# Patient Record
Sex: Female | Born: 1990
Health system: Southern US, Community
[De-identification: ages and names within clinical notes are randomized; demographics above are authoritative.]

---

## 2019-01-26 ENCOUNTER — Inpatient Hospital Stay (HOSPITAL_COMMUNITY)
Admission: EM | Admit: 2019-01-26 | Discharge: 2019-01-31 | DRG: 871 | Disposition: A | Discharge status: Home / Self Care | Payer: Self-pay | Source: Other Acute Inpatient Hospital | Attending: Internal Medicine | Admitting: Internal Medicine

## 2019-01-26 ENCOUNTER — Other Ambulatory Visit: Payer: Self-pay

## 2019-01-26 ENCOUNTER — Inpatient Hospital Stay (HOSPITAL_COMMUNITY): Payer: Self-pay

## 2019-01-26 ENCOUNTER — Encounter (HOSPITAL_COMMUNITY): Payer: Self-pay | Admitting: *Deleted

## 2019-01-26 DIAGNOSIS — R031 Nonspecific low blood-pressure reading: Secondary | ICD-10-CM | POA: Diagnosis present

## 2019-01-26 DIAGNOSIS — N61 Mastitis without abscess: Secondary | ICD-10-CM | POA: Diagnosis present

## 2019-01-26 DIAGNOSIS — Z803 Family history of malignant neoplasm of breast: Secondary | ICD-10-CM

## 2019-01-26 DIAGNOSIS — I959 Hypotension, unspecified: Secondary | ICD-10-CM | POA: Diagnosis present

## 2019-01-26 DIAGNOSIS — A419 Sepsis, unspecified organism: Principal | ICD-10-CM | POA: Diagnosis present

## 2019-01-26 DIAGNOSIS — Y95 Nosocomial condition: Secondary | ICD-10-CM | POA: Diagnosis present

## 2019-01-26 DIAGNOSIS — R112 Nausea with vomiting, unspecified: Secondary | ICD-10-CM | POA: Diagnosis present

## 2019-01-26 DIAGNOSIS — N611 Abscess of the breast and nipple: Secondary | ICD-10-CM | POA: Diagnosis present

## 2019-01-26 DIAGNOSIS — E876 Hypokalemia: Secondary | ICD-10-CM | POA: Diagnosis present

## 2019-01-26 DIAGNOSIS — J9621 Acute and chronic respiratory failure with hypoxia: Secondary | ICD-10-CM | POA: Diagnosis present

## 2019-01-26 DIAGNOSIS — D649 Anemia, unspecified: Secondary | ICD-10-CM | POA: Diagnosis present

## 2019-01-26 DIAGNOSIS — N179 Acute kidney failure, unspecified: Secondary | ICD-10-CM | POA: Diagnosis present

## 2019-01-26 DIAGNOSIS — Z20828 Contact with and (suspected) exposure to other viral communicable diseases: Secondary | ICD-10-CM | POA: Diagnosis present

## 2019-01-26 DIAGNOSIS — J189 Pneumonia, unspecified organism: Secondary | ICD-10-CM | POA: Diagnosis present

## 2019-01-26 LAB — COMPREHENSIVE METABOLIC PANEL
ALT: 19 U/L (ref 0–44)
AST: 17 U/L (ref 15–41)
Albumin: 2 g/dL — ABNORMAL LOW (ref 3.5–5.0)
Alkaline Phosphatase: 98 U/L (ref 38–126)
Anion gap: 12 (ref 5–15)
BUN: 6 mg/dL (ref 6–20)
CO2: 20 mmol/L — ABNORMAL LOW (ref 22–32)
Calcium: 7.4 mg/dL — ABNORMAL LOW (ref 8.9–10.3)
Chloride: 107 mmol/L (ref 98–111)
Creatinine, Ser: 1.19 mg/dL — ABNORMAL HIGH (ref 0.44–1.00)
GFR calc Af Amer: >60 mL/min (ref >60)
GFR calc non Af Amer: >60 mL/min (ref >60)
Glucose, Bld: 97 mg/dL (ref 70–99)
Potassium: 2.8 mmol/L — ABNORMAL LOW (ref 3.5–5.1)
Sodium: 139 mmol/L (ref 135–145)
Total Bilirubin: 1 mg/dL (ref 0.3–1.2)
Total Protein: 5.5 g/dL — ABNORMAL LOW (ref 6.5–8.1)

## 2019-01-26 LAB — CBC WITH DIFFERENTIAL/PLATELET
Abs Immature Granulocytes: 0.17 10*3/uL — ABNORMAL HIGH (ref 0.00–0.07)
Basophils Absolute: 0.1 10*3/uL (ref 0.0–0.1)
Basophils Relative: 0 %
Eosinophils Absolute: 0 10*3/uL (ref 0.0–0.5)
Eosinophils Relative: 0 %
HCT: 30.2 % — ABNORMAL LOW (ref 36.0–46.0)
Hemoglobin: 10 g/dL — ABNORMAL LOW (ref 12.0–15.0)
Immature Granulocytes: 1 %
Lymphocytes Relative: 10 %
Lymphs Abs: 2.4 10*3/uL (ref 0.7–4.0)
MCH: 26.7 pg (ref 26.0–34.0)
MCHC: 33.1 g/dL (ref 30.0–36.0)
MCV: 80.7 fL (ref 80.0–100.0)
Monocytes Absolute: 2.3 10*3/uL — ABNORMAL HIGH (ref 0.1–1.0)
Monocytes Relative: 10 %
Neutro Abs: 18.5 10*3/uL — ABNORMAL HIGH (ref 1.7–7.7)
Neutrophils Relative %: 79 %
Platelets: 269 10*3/uL (ref 150–400)
RBC: 3.74 MIL/uL — ABNORMAL LOW (ref 3.87–5.11)
RDW: 14.7 % (ref 11.5–15.5)
WBC: 23.4 10*3/uL — ABNORMAL HIGH (ref 4.0–10.5)
nRBC: 0 % (ref 0.0–0.2)

## 2019-01-26 LAB — SARS CORONAVIRUS 2 BY RT PCR (HOSPITAL ORDER, PERFORMED IN ~~LOC~~ HOSPITAL LAB): SARS Coronavirus 2: NEGATIVE

## 2019-01-26 LAB — TRIGLYCERIDES: Triglycerides: 93 mg/dL (ref <150)

## 2019-01-26 LAB — FERRITIN: Ferritin: 206 ng/mL (ref 11–307)

## 2019-01-26 LAB — PROCALCITONIN: Procalcitonin: 0.22 ng/mL

## 2019-01-26 LAB — I-STAT BETA HCG BLOOD, ED (MC, WL, AP ONLY): I-stat hCG, quantitative: <5 m[IU]/mL (ref <5)

## 2019-01-26 LAB — D-DIMER, QUANTITATIVE: D-Dimer, Quant: 3.62 ug/mL-FEU — ABNORMAL HIGH (ref 0.00–0.50)

## 2019-01-26 LAB — C-REACTIVE PROTEIN: CRP: 29.1 mg/dL — ABNORMAL HIGH (ref <1.0)

## 2019-01-26 LAB — LACTIC ACID, PLASMA
Lactic Acid, Venous: 0.7 mmol/L (ref 0.5–1.9)
Lactic Acid, Venous: 0.8 mmol/L (ref 0.5–1.9)

## 2019-01-26 LAB — MAGNESIUM: Magnesium: 1.6 mg/dL — ABNORMAL LOW (ref 1.7–2.4)

## 2019-01-26 LAB — VANCOMYCIN, TROUGH: Vancomycin Tr: 15 ug/mL (ref 15–20)

## 2019-01-26 LAB — LACTATE DEHYDROGENASE: LDH: 197 U/L — ABNORMAL HIGH (ref 98–192)

## 2019-01-26 LAB — FIBRINOGEN: Fibrinogen: 743 mg/dL — ABNORMAL HIGH (ref 210–475)

## 2019-01-26 MED ORDER — ALBUTEROL SULFATE (2.5 MG/3ML) 0.083% IN NEBU
2.5000 mg | INHALATION_SOLUTION | Freq: Four times a day (QID) | RESPIRATORY_TRACT | Status: DC
  Administered 2019-01-26 – 2019-01-27 (×3): 2.5 mg via RESPIRATORY_TRACT
  Filled 2019-01-26 (×4): qty 3

## 2019-01-26 MED ORDER — SODIUM CHLORIDE 0.9% FLUSH
3.0000 mL | Freq: Two times a day (BID) | INTRAVENOUS | Status: DC
  Administered 2019-01-26 – 2019-01-27 (×3): 3 mL via INTRAVENOUS

## 2019-01-26 MED ORDER — VANCOMYCIN HCL IN DEXTROSE 1-5 GM/200ML-% IV SOLN: 1000.0000 mg | INTRAVENOUS | Status: DC

## 2019-01-26 MED ORDER — VANCOMYCIN HCL 10 G IV SOLR
1500.0000 mg | INTRAVENOUS | Status: DC
  Administered 2019-01-26 – 2019-01-28 (×4): 1500 mg via INTRAVENOUS
  Filled 2019-01-26 (×5): qty 1500

## 2019-01-26 MED ORDER — ONDANSETRON HCL 4 MG/2ML IJ SOLN
4.0000 mg | Freq: Four times a day (QID) | INTRAMUSCULAR | Status: DC | PRN
  Administered 2019-01-27 – 2019-01-29 (×4): 4 mg via INTRAVENOUS
  Filled 2019-01-26 (×4): qty 2

## 2019-01-26 MED ORDER — ACETAMINOPHEN 325 MG PO TABS
650.0000 mg | ORAL_TABLET | Freq: Four times a day (QID) | ORAL | Status: DC | PRN
  Administered 2019-01-28: 650 mg via ORAL
  Filled 2019-01-26 (×2): qty 2

## 2019-01-26 MED ORDER — ENOXAPARIN SODIUM 40 MG/0.4ML ~~LOC~~ SOLN
40.0000 mg | SUBCUTANEOUS | Status: DC
  Administered 2019-01-26 – 2019-01-31 (×6): 40 mg via SUBCUTANEOUS
  Filled 2019-01-26 (×6): qty 0.4

## 2019-01-26 MED ORDER — SODIUM CHLORIDE 0.9 % IV SOLN
1.0000 g | INTRAVENOUS | Status: DC
  Administered 2019-01-26 – 2019-01-30 (×5): 1 g via INTRAVENOUS
  Filled 2019-01-26 (×5): qty 10

## 2019-01-26 MED ORDER — POTASSIUM CHLORIDE 10 MEQ/100ML IV SOLN
10.0000 meq | INTRAVENOUS | Status: AC
  Administered 2019-01-26 (×4): 10 meq via INTRAVENOUS
  Filled 2019-01-26 (×4): qty 100

## 2019-01-26 MED ORDER — METHYLPREDNISOLONE SODIUM SUCC 125 MG IJ SOLR
60.0000 mg | Freq: Three times a day (TID) | INTRAMUSCULAR | Status: DC
  Administered 2019-01-26 – 2019-01-27 (×3): 60 mg via INTRAVENOUS
  Filled 2019-01-26 (×3): qty 2

## 2019-01-26 MED ORDER — ALBUTEROL SULFATE (2.5 MG/3ML) 0.083% IN NEBU: 2.5000 mg | INHALATION_SOLUTION | Freq: Four times a day (QID) | RESPIRATORY_TRACT | Status: DC

## 2019-01-26 MED ORDER — SODIUM CHLORIDE 0.9 % IV SOLN
500.0000 mg | INTRAVENOUS | Status: DC
  Administered 2019-01-26 – 2019-01-28 (×3): 500 mg via INTRAVENOUS
  Filled 2019-01-26 (×4): qty 500

## 2019-01-26 MED ORDER — SODIUM CHLORIDE 0.9 % IV SOLN
INTRAVENOUS | Status: DC
  Administered 2019-01-26 – 2019-01-30 (×6): via INTRAVENOUS

## 2019-01-26 MED ORDER — FERROUS SULFATE 325 (65 FE) MG PO TABS: 325.0000 mg | ORAL_TABLET | Freq: Every day | ORAL | Status: DC

## 2019-01-26 MED ORDER — TECHNETIUM TO 99M ALBUMIN AGGREGATED
1.5000 | Freq: Once | INTRAVENOUS | Status: AC | PRN
  Administered 2019-01-26: 1.5 via INTRAVENOUS

## 2019-01-26 MED ORDER — GUAIFENESIN ER 600 MG PO TB12
600.0000 mg | ORAL_TABLET | Freq: Two times a day (BID) | ORAL | Status: DC
  Administered 2019-01-27 – 2019-01-30 (×8): 600 mg via ORAL
  Filled 2019-01-26 (×10): qty 1

## 2019-01-26 MED ORDER — ONDANSETRON HCL 4 MG PO TABS: 4.0000 mg | ORAL_TABLET | Freq: Four times a day (QID) | ORAL | Status: DC | PRN

## 2019-01-26 MED ORDER — ALBUTEROL SULFATE (2.5 MG/3ML) 0.083% IN NEBU: 2.5000 mg | INHALATION_SOLUTION | Freq: Four times a day (QID) | RESPIRATORY_TRACT | Status: DC | PRN

## 2019-01-26 MED ORDER — PANTOPRAZOLE SODIUM 40 MG IV SOLR
40.0000 mg | Freq: Two times a day (BID) | INTRAVENOUS | Status: DC
  Administered 2019-01-26 – 2019-01-29 (×8): 40 mg via INTRAVENOUS
  Filled 2019-01-26 (×8): qty 40

## 2019-01-26 MED ORDER — MORPHINE SULFATE (PF) 2 MG/ML IV SOLN
2.0000 mg | INTRAVENOUS | Status: DC | PRN
  Administered 2019-01-26 – 2019-01-29 (×5): 2 mg via INTRAVENOUS
  Filled 2019-01-26 (×5): qty 1

## 2019-01-26 MED ORDER — POTASSIUM CHLORIDE 10 MEQ/100ML IV SOLN
10.0000 meq | INTRAVENOUS | Status: AC
  Administered 2019-01-26 (×3): 10 meq via INTRAVENOUS
  Filled 2019-01-26 (×3): qty 100

## 2019-01-26 MED ORDER — MAGNESIUM SULFATE 2 GM/50ML IV SOLN
2.0000 g | Freq: Once | INTRAVENOUS | Status: AC
  Administered 2019-01-26: 18:00:00 2 g via INTRAVENOUS
  Filled 2019-01-26: qty 50

## 2019-01-26 MED ORDER — ACETAMINOPHEN 650 MG RE SUPP: 650.0000 mg | Freq: Four times a day (QID) | RECTAL | Status: DC | PRN

## 2019-01-26 MED ORDER — POTASSIUM CHLORIDE CRYS ER 20 MEQ PO TBCR
40.0000 meq | EXTENDED_RELEASE_TABLET | ORAL | Status: DC
  Filled 2019-01-26: qty 2

## 2019-01-26 NOTE — Progress Notes (Signed)
Patient admitted to room 5w15 from ED. Alert oriented x4 Respirations labored with ambulating from toilet. 4L oxygen via HFNC. Patient drinking sips of water, stated nausea is better but doesn't need any antiemetic at this time. Started IV potassium and NS infusion. Oriented to room and using call bell. Will continue to monitor.

## 2019-01-26 NOTE — ED Notes (Signed)
ED TO INPATIENT HANDOFF REPORT  ED Nurse Name and Phone #: Britta MccreedyMichele Johntavious Francom RN  S Name/Age/Gender Erica RisingSonia Sadaf Williamson 28 y.o. female Room/Bed: 036C/036C  Code Status   Code Status: Not on file  Home/SNF/Other Home Patient oriented to: self Is this baseline? Yes   Triage Complete: Triage complete  Chief Complaint R/O COVID  Triage Note Pt transferred from [facility] [place] from an in-patient bed there for  Lt breast abscess for 2 days  She had a covid test there this am  24 hour turn around   Allergies No Known Allergies  Level of Care/Admitting Diagnosis ED Disposition    ED Disposition Condition Comment   Admit  [facility] [[id number]]  Level of Care: Progressive [102]  Covid Evaluation: Asymptomatic Screening Protocol (No Symptoms)  Diagnosis: Mastitis [200734]  Admitting Physician: Clydie BraunSMITH, RONDELL A [[id number]]  Attending Physician: Clydie BraunSMITH, RONDELL A [[id number]]  Estimated length of stay: past midnight tomorrow  Certification:: I certify this patient will need inpatient services for at least 2 midnights  PT Class (Do Not Modify): Inpatient [101]  PT Acc Code (Do Not Modify): Private [1]       B Medical/Surgery History History reviewed. No pertinent past medical history. History reviewed. No pertinent surgical history.   A IV Location/Drains/Wounds Patient Lines/Drains/Airways Status   Active Line/Drains/Airways    Name:   Placement date:   Placement time:   Site:   Days:   Peripheral IV 01/26/19 Left Antecubital   01/26/19    0300    Antecubital   less than 1   Peripheral IV 01/26/19   01/26/19    0845    -   less than 1          Intake/Output Last 24 hours No intake or output data in the 24 hours ending 01/26/19 [id number]  Labs/Imaging Results for orders placed or performed during the hospital encounter of 01/26/19 (from the past 48 hour(s))  SARS Coronavirus 2 (CEPHEID- Performed in [facility] hospital lab), Hosp Order      Status: None   Collection Time: 01/26/19  5:50 AM   Specimen: Nasopharyngeal Swab  Result Value Ref Range   SARS Coronavirus 2 NEGATIVE NEGATIVE    Comment: (NOTE) If result is NEGATIVE SARS-CoV-2 target nucleic acids are NOT DETECTED. The SARS-CoV-2 RNA is generally detectable in upper and lower  respiratory specimens during the acute phase of infection. The lowest  concentration of SARS-CoV-2 viral copies this assay can detect is 250  copies / mL. A negative result does not preclude SARS-CoV-2 infection  and should not be used as the sole basis for treatment or other  patient management decisions.  A negative result may occur with  improper specimen collection / handling, submission of specimen other  than nasopharyngeal swab, presence of viral mutation(s) within the  areas targeted by this assay, and inadequate number of viral copies  (<250 copies / mL). A negative result must be combined with clinical  observations, patient history, and epidemiological information. If result is POSITIVE SARS-CoV-2 target nucleic acids are DETECTED. The SARS-CoV-2 RNA is generally detectable in upper and lower  respiratory specimens dur ing the acute phase of infection.  Positive  results are indicative of active infection with SARS-CoV-2.  Clinical  correlation with patient history and other diagnostic information is  necessary to determine patient infection status.  Positive results do  not rule out bacterial infection or co-infection with other viruses. If result is PRESUMPTIVE  POSTIVE SARS-CoV-2 nucleic acids MAY BE PRESENT.   A presumptive positive result was obtained on the submitted specimen  and confirmed on repeat testing.  While 2019 novel coronavirus  (SARS-CoV-2) nucleic acids may be present in the submitted sample  additional confirmatory testing may be necessary for epidemiological  and / or clinical management purposes  to differentiate between  SARS-CoV-2 and other  Sarbecovirus currently known to infect humans.  If clinically indicated additional testing with an alternate test  methodology [phone]) is advised. The SARS-CoV-2 RNA is generally  detectable in upper and lower respiratory sp ecimens during the acute  phase of infection. The expected result is Negative. Fact Sheet for Patients:  StrictlyIdeas.no Fact Sheet for Healthcare Providers: BankingDealers.co.za This test is not yet approved or cleared by the [place] FDA and has been authorized for detection and/or diagnosis of SARS-CoV-2 by FDA under an Emergency Use Authorization (EUA).  This EUA will remain in effect (meaning this test can be used) for the duration of the COVID-19 declaration under Section 564(b)(1) of the Act, 21 U.S.C. section 360bbb-3(b)(1), unless the authorization is terminated or revoked sooner. Performed at [facility], [place] [address], [place], [place] [address]   Lactic acid, plasma     Status: None   Collection Time: 01/26/19  6:21 AM  Result Value Ref Range   Lactic Acid, Venous 0.7 0.5 - 1.9 mmol/L    Comment: Performed at [facility] [address] [place], [place] [address]  CBC WITH DIFFERENTIAL     Status: Abnormal   Collection Time: 01/26/19  6:21 AM  Result Value Ref Range   WBC 23.4 (H) 4.0 - 10.5 K/uL   RBC 3.74 (L) 3.87 - 5.11 MIL/uL   Hemoglobin 10.0 (L) 12.0 - 15.0 g/dL   HCT 30.2 (L) 36.0 - 46.0 %   MCV 80.7 80.0 - 100.0 fL   MCH 26.7 26.0 - 34.0 pg   MCHC 33.1 30.0 - 36.0 g/dL   RDW 14.7 11.5 - 15.5 %   Platelets 269 150 - 400 K/uL   nRBC 0.0 0.0 - 0.2 %   Neutrophils Relative % 79 %   Neutro Abs 18.5 (H) 1.7 - 7.7 K/uL   Lymphocytes Relative 10 %   Lymphs Abs 2.4 0.7 - 4.0 K/uL   Monocytes Relative 10 %   Monocytes Absolute 2.3 (H) 0.1 - 1.0 K/uL   Eosinophils Relative 0 %   Eosinophils Absolute 0.0 0.0 - 0.5 K/uL   Basophils Relative 0 %   Basophils Absolute 0.1 0.0  - 0.1 K/uL   Immature Granulocytes 1 %   Abs Immature Granulocytes 0.17 (H) 0.00 - 0.07 K/uL    Comment: Performed at [facility], [address], [place], [place] [address]  Comprehensive metabolic panel     Status: Abnormal   Collection Time: 01/26/19  6:21 AM  Result Value Ref Range   Sodium 139 135 - 145 mmol/L   Potassium 2.8 (L) 3.5 - 5.1 mmol/L   Chloride 107 98 - 111 mmol/L   CO2 20 (L) 22 - 32 mmol/L   Glucose, Bld 97 70 - 99 mg/dL   BUN 6 6 - 20 mg/dL   Creatinine, Ser 1.19 (H) 0.44 - 1.00 mg/dL   Calcium 7.4 (L) 8.9 - 10.3 mg/dL   Total Protein 5.5 (L) 6.5 - 8.1 g/dL   Albumin 2.0 (L) 3.5 - 5.0 g/dL   AST 17 15 - 41 U/L  ALT 19 0 - 44 U/L   Alkaline Phosphatase 98 38 - 126 U/L   Total Bilirubin 1.0 0.3 - 1.2 mg/dL   GFR calc non Af Amer >60 >60 mL/min   GFR calc Af Amer >60 >60 mL/min   Anion gap 12 5 - 15    Comment: Performed at [facility], [address] [place], [place] [address]  D-dimer, quantitative     Status: Abnormal   Collection Time: 01/26/19  6:21 AM  Result Value Ref Range   D-Dimer, Quant 3.62 (H) 0.00 - 0.50 ug/mL-FEU    Comment: (NOTE) At the manufacturer cut-off of 0.50 ug/mL FEU, this assay has been documented to exclude PE with a sensitivity and negative predictive value of 97 to 99%.  At this time, this assay has not been approved by the FDA to exclude DVT/VTE. Results should be correlated with clinical presentation. Performed at [facility], [address] [place], [place] [address]   Lactate dehydrogenase     Status: Abnormal   Collection Time: 01/26/19  6:21 AM  Result Value Ref Range   LDH 197 (H) 98 - 192 U/L    Comment: Performed at [facility], [address] [place], [place] [address]  Ferritin     Status: None   Collection Time: 01/26/19  6:21 AM  Result Value Ref Range   Ferritin 206 11 - 307 ng/mL    Comment: Performed at [facility], [address] [place], [place] [address]   Fibrinogen     Status: Abnormal   Collection Time: 01/26/19  6:21 AM  Result Value Ref Range   Fibrinogen 743 (H) 210 - 475 mg/dL    Comment: Performed at [facility], [address] [place], [place] [address]  C-reactive protein     Status: Abnormal   Collection Time: 01/26/19  6:21 AM  Result Value Ref Range   CRP 29.1 (H) <1.0 mg/dL    Comment: Performed at [facility], [address] [place], [place] [address]  Triglycerides     Status: None   Collection Time: 01/26/19  6:21 AM  Result Value Ref Range   Triglycerides 93 <150 mg/dL    Comment: Performed at [facility], [address] [place], [place] [address]  I-Stat beta hCG blood, ED     Status: None   Collection Time: 01/26/19  6:48 AM  Result Value Ref Range   I-stat hCG, quantitative <5.0 <5 mIU/mL   Comment 3            Comment:   GEST. AGE      CONC.  (mIU/mL)   <=1 WEEK        5 - 50     2 WEEKS       50 - 500     3 WEEKS       100 - 10,000     4 WEEKS     1,000 - 30,000        FEMALE AND NON-PREGNANT FEMALE:     LESS THAN 5 mIU/mL    No results found.  Pending Labs Unresulted Labs (From admission, onward)    Start     Ordered   01/26/19 [id number]  Lactic acid, plasma  Now then every 2 hours,   STAT     01/26/19 0613   01/26/19 0613  Blood Culture (routine x 2)  BLOOD CULTURE X 2,  STAT     01/26/19 0613   01/26/19 0613  Procalcitonin  ONCE - STAT,   STAT     01/26/19 0613          Vitals/Pain Today's Vitals   01/26/19 0745 01/26/19 0800 01/26/19 0815 01/26/19 0830  BP: 114/77 124/75 114/76 112/77  Pulse: 85 85 89 86  Resp: (!) 24 (!) 25 (!) 24 (!) 26  Temp:      SpO2: 98% 99% 98% 97%  Height:      PainSc:        Isolation Precautions No active isolations  Medications Medications - No data to display  Mobility walks Low fall risk   Focused Assessments Cardiac Assessment Handoff:  Cardiac Rhythm: Normal sinus rhythm No results found for: CKTOTAL, CKMB, CKMBINDEX,  TROPONINI Lab Results  Component Value Date   DDIMER 3.62 (H) 01/26/2019   Does the Patient currently have chest pain? No     R Recommendations: See Admitting Provider Note  Report given to:   Additional Notes: Patient alert and oriented x 4.

## 2019-01-26 NOTE — ED Triage Notes (Signed)
Pt transferred from [facility] in [place] from an in-patient bed there for  Lt breast abscess for 2 days  She had a covid test there this am  24 hour turn around

## 2019-01-26 NOTE — Progress Notes (Addendum)
Pharmacy Antibiotic Note  Erica Williamson is a 28 y.o. female admitted on 01/26/2019 with left breast abscess.  Pharmacy has been consulted for vancomycin dosing.  Plan: Vancomycin 1500 mg IV Q24H. Goal AUC 400-500. Expected AUC: 534 SCr used: 1.19  Height: 5\' 4"  (162.6 cm) Weight: 158 lb 1.1 oz (71.7 kg) IBW/kg (Calculated) : 54.7  Temp (24hrs), Avg:98.5 F (36.9 C), Min:98 F (36.7 C), Max:99.2 F (37.3 C)  Recent Labs  Lab 01/26/19 0621 01/26/19 0840 01/26/19 1235  WBC 23.4*  --   --   CREATININE 1.19*  --   --   LATICACIDVEN 0.7 0.8  --   VANCOTROUGH  --   --  15    Estimated Creatinine Clearance: 67.7 mL/min (A) (by C-G formula based on SCr of 1.19 mg/dL (H)).    No Known Allergies  Antimicrobials this admission: 7/8 Vancomycin >>  7/8 Rocephin >>   Dose adjustments this admission: Vancomycin 1g Q8h on admit, last dose 7/10 at 0458, 1215 VT 15. Per vanc calculation, 1g Q8h predicted AUC >1000. Reduced to vanc 1500 mg IV Q24H.  Microbiology results: 7/11 BCx: pending 7/11 COVID: negative  Thank you for allowing pharmacy to be a part of this patient's care.  Mimi L. Devin Going, PharmD, [place] PGY1 Pharmacy Resident [employer]: [phone]  Mobile: [phone] 01/26/19 3:23 PM  Please check AMION for all [place] phone numbers After 10:00 PM, call the [facility] [phone]  Addendum:  Patient with Acute kidney injury: Patient's baseline creatinine previously noted to be 0.4 on admission to [facility], but elevated up to 1.19 on recheck today.  Thus changed vancomycin dosing from 1g q8h to 1500mg  IV q24h for goal AUC 400-550.   Nicole Cella, RPh Clinical Pharmacist [phone] 01/26/2019 Now

## 2019-01-26 NOTE — Progress Notes (Addendum)
Visit by husband approved by [facility]. Updated him at bedside.  Assisted patient with use of I/S and flutter valve x10 an hour. Patient appears more alert and states she feels better at fruit for dinner.

## 2019-01-26 NOTE — Progress Notes (Signed)
Patient refusing oral meds held mucinex and oral potassium. Refused zofran for nausea.  Husband called for an update and immediately asked to speak with MD.  Paged Dr. Tamala Julian.

## 2019-01-26 NOTE — ED Notes (Signed)
The pt arrived with 2 ivs in her lt arm with dopamine running  [facility] brought the pt in and had reduced the dopamine drip to 2   And they reported that the pts vitals were stable  The pt reports lt breast pain since June 26th  Her lt breast pain is on the lt side and is a 10 if she touches it or anything strikes it other otherwise she is not in pain   The redness is around her areola  And the lt nipple

## 2019-01-26 NOTE — ED Notes (Signed)
The pt is not breast feeding

## 2019-01-26 NOTE — ED Notes (Signed)
Patient husband-  [place]  Pt has edema noted to full length L arm- hand to axillae.   X2 IV sites with fluid infusing.   IV stopped and will restart on other extremity   Warm packs applied.

## 2019-01-26 NOTE — ED Notes (Signed)
The pt has been on her cell phone since she arrived here in ed  Difficult to get triage information from the pt

## 2019-01-26 NOTE — Progress Notes (Signed)
VQ scan was noted to be a low risk probability of pulmonary embolus.  Continue with subcutaneous Lovenox for DVT prophylaxis.

## 2019-01-26 NOTE — ED Notes (Signed)
Both nss running at 125/hr  And the dopamine drip infusing at 4.8 ml or 12mcg/mg/min

## 2019-01-26 NOTE — ED Notes (Signed)
Pt off of Dopamine drip. BP maintaining 062I systolic with MAPs of 80.

## 2019-01-26 NOTE — ED Provider Notes (Signed)
[facility] EMERGENCY DEPARTMENT Provider Note   CSN: [id number] Arrival date & time: 01/26/19  0531    History   Chief Complaint Chief Complaint  Patient presents with  . admission    HPI Erica Williamson is a 28 y.o. female.     Patient presents to the emergency department as a transfer from [facility].  Patient has been admitted for the last 3 days for treatment of a left breast abscess.  Prior to that, patient had been on antibiotics for 1 week.  Area was worsening so she was admitted through the ER there.  She has been on IV antibiotics without improvement.  Today she started to have respiratory difficulty.  She reports that prior to today she did not have any chest discomfort or shortness of breath.  Patient underwent CT scan today to further evaluate and had evidence of pneumonia that was suspicious for COVID-19.  [facility] to [facility].  Patient was accepted to the ER for evaluation prior to admission.  The reason for this was that if the patient is positive for COVID-19, she needs to be admitted to [facility].  If she is negative, she needs to be admitted here to [facility].  Direct admission was therefore not possible.     History reviewed. No pertinent past medical history.  There are no active problems to display for this patient.   History reviewed. No pertinent surgical history.   OB History   No obstetric history on file.      Home Medications    Prior to Admission medications   Not on File    Family History No family history on file.  Social History Social History   Tobacco Use  . Smoking status: Never Smoker  . Smokeless tobacco: Never Used  Substance Use Topics  . Alcohol use: Never    Frequency: Never  . Drug use: Not on file     Allergies   Patient has no known allergies.   Review of Systems Review of Systems  Constitutional: Positive for chills and fever.  Respiratory: Positive for chest  tightness and shortness of breath.   Skin: Positive for color change.  All other systems reviewed and are negative.    Physical Exam Updated Vital Signs BP 108/69   Pulse 86   Temp 99.2 F (37.3 C)   Resp (!) 24   Ht 5\' 4"  (1.626 m)   LMP 01/06/2019   SpO2 100%   Physical Exam Vitals signs and nursing note reviewed.  Constitutional:      General: She is not in acute distress.    Appearance: Normal appearance. She is well-developed.  HENT:     Head: Normocephalic and atraumatic.     Right Ear: Hearing normal.     Left Ear: Hearing normal.     Nose: Nose normal.  Eyes:     Conjunctiva/sclera: Conjunctivae normal.     Pupils: Pupils are equal, round, and reactive to light.  Neck:     Musculoskeletal: Normal range of motion and neck supple.  Cardiovascular:     Rate and Rhythm: Regular rhythm.     Heart sounds: S1 normal and S2 normal. No murmur. No friction rub. No gallop.   Pulmonary:     Effort: Pulmonary effort is normal. No respiratory distress.     Breath sounds: Normal breath sounds.  Chest:     Chest wall: No tenderness.  Abdominal:     General: Bowel  sounds are normal.     Palpations: Abdomen is soft.     Tenderness: There is no abdominal tenderness. There is no guarding or rebound. Negative signs include Murphy's sign and McBurney's sign.     Hernia: No hernia is present.  Musculoskeletal: Normal range of motion.  Skin:    General: Skin is warm and dry.     Findings: No rash.  Neurological:     Mental Status: She is alert and oriented to person, place, and time.     GCS: GCS eye subscore is 4. GCS verbal subscore is 5. GCS motor subscore is 6.     Cranial Nerves: No cranial nerve deficit.     Sensory: No sensory deficit.     Coordination: Coordination normal.  Psychiatric:        Speech: Speech normal.        Behavior: Behavior normal.        Thought Content: Thought content normal.      ED Treatments / Results  Labs (all labs ordered are listed,  but only abnormal results are displayed) Labs Reviewed  SARS CORONAVIRUS 2 (HOSPITAL ORDER, PERFORMED IN [facility])  CULTURE, BLOOD (ROUTINE X 2)  CULTURE, BLOOD (ROUTINE X 2)  LACTIC ACID, PLASMA  LACTIC ACID, PLASMA  CBC WITH DIFFERENTIAL/PLATELET  COMPREHENSIVE METABOLIC PANEL  D-DIMER, QUANTITATIVE (NOT AT [facility])  PROCALCITONIN  LACTATE DEHYDROGENASE  FERRITIN  FIBRINOGEN  C-REACTIVE PROTEIN  TRIGLYCERIDES  I-STAT BETA HCG BLOOD, ED ([facility], [facility], [facility] ONLY)    EKG None  Radiology No results found.  Procedures Procedures (including critical care time)  Medications Ordered in ED Medications - No data to display   Initial Impression / Assessment and Plan / ED Course  I have reviewed the triage vital signs and the nursing notes.  Pertinent labs & imaging results that were available during my care of the patient were reviewed by me and considered in my medical decision making (see chart for details).        Patient resting comfortably at arrival to the ER.  She reports that she feels tight in her chest and short of breath, however, she does not exhibit any signs of respiratory distress.  Oxygen saturations are 100%.  Lungs are clear on auscultation.  Patient was transferred on dopamine, blood pressure 112 systolic at arrival.    Patient noted to be on essentially renal dosing dopamine arrival.  [facility] had been titrating it down to during transport.  Dopamine was stopped and her pressures have been maintained.  She is on maintenance fluids at this time, no distress.  COVID testing has returned and is negative.  Patient will be appropriate for admission here at [facility] for continued treatment of breast abscess and pneumonia.  Final Clinical Impressions(s) / ED Diagnoses   Final diagnoses:  Breast abscess  Pneumonia due to infectious organism, unspecified laterality, unspecified part of lung    ED Discharge Orders    None       Gilda CreasePollina, Aleeta Schmaltz J,  MD 01/26/19 [phone]

## 2019-01-26 NOTE — ED Notes (Signed)
Attempt to call 5 W x 2 to give report with no answer to charge nurse phone.

## 2019-01-26 NOTE — H&P (Addendum)
History and Physical    Erica Williamson ZOX:[id number] DOB: Mar 06, 1991 DOA: 01/26/2019  Referring MD/NP/PA: Jaci Carrelhristopher Pollina, MD PCP: Silas Floodionne Galloway, MD Patient coming [facility]: [facility]  Chief Complaint: Breast discomfort  I have personally briefly reviewed patient's old [facility]   HPI: Erica Williamson Erica Williamson is a 28 y.o. female without significant past medical history; who presented breast discomfort and enlargement.  Patient reported that she initially noted a lump on her left breast on June 24.  She followed up with Dr. Donzetta MattersGalloway of OB/GYN 6 days later, and was started started on antibiotics.  Treated with doxycycline and subsequently Augmentin without any improvement in symptoms.  Associated symptoms included fevers and chills. Patient had been sent for ultrasound of the breast on 7/8, which revealed worsening infection and with no signs of an abscess.  She had been advised to come [facility] on 7/8, where she was admitted into the hospital at that time.  Blood cultures were obtained along with placing the patient on empiric antibiotics of vancomycin and Rocephin for treatment of sepsis secondary to mastitis of the left breast.  Vital signs were noted to be stable at that time.  Admission lab work revealed WBC 20.4, hemoglobin 13.5, platelets 336, creatinine 0.46, and lactic acid 0.8.  Despite antibiotics patient reportedly still continued to spike fever up 103 F and showed worsening white blood cell count.  Patient reportedly became more short of breath in the last 24 hours.  Labs on 7/10 revealed WBC 22, hemoglobin 10.2, creatinine 1.5, troponin negative, and d-dimer 1.98.  CT scan of the chest with contrast was obtained showing signs of a multifocal pneumonia and left mastitis without signs of fluid collection.   Patient was noted to be hypotensive and temporarily placed on dopamine and was on nasal cannula oxygen.   Patient was transferred prior to COVID-19 testing as there was a 24-hour turnaround time.  Transfer was requested and patient had initially been transferred to ED.  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  She really has not had a cough but has felt short of breath to the point which makes her cough.  She complains of heaviness in her chest and pain with taking deep inspiratory breaths.  ED Course: Upon admission into the emergency department patient was noted to be afebrile, blood pressure currently stable off of dopamine, and O2 saturations maintained on 4 L of nasal cannula oxygen.  Labs revealed WBC 23.4, hemoglobin 10, potassium 2.8, BUN 6, creatinine 1.19, magnesium 1.6 and CRP 29.1.  Chest x-ray showing interval increase in the bilateral pleural effusions and associated bibasilar atelectasis/consolidation.  COVID-19 negative.    Review of Systems  Constitutional: Positive for fever and malaise/fatigue.  HENT: Negative for ear discharge and nosebleeds.   Eyes: Negative for pain.  Respiratory: Positive for cough and shortness of breath.   Cardiovascular: Positive for chest pain. Negative for leg swelling.  Gastrointestinal: Positive for nausea. Negative for abdominal pain, diarrhea and vomiting.  Genitourinary: Negative for dysuria and hematuria.  Musculoskeletal: Positive for myalgias. Negative for falls.  Skin: Positive for rash.       Positive for color change of the breast  Neurological: Positive for weakness. Negative for loss of consciousness.  Psychiatric/Behavioral: Negative for memory loss and suicidal ideas.    History reviewed. No pertinent past medical history.  Past Surgical History:  Procedure Laterality Date   CESAREAN SECTION  X2     reports that she has never smoked. She has never used smokeless tobacco. She reports that she does not drink alcohol. No history on file for drug.  No Known Allergies  Patient denies any pertinent family  history of any medical problems.  Prior to Admission medications   Medication Sig Start Date End Date Taking? Authorizing Provider  amoxicillin-clavulanate (AUGMENTIN) 875-125 MG tablet Take 1 tablet by mouth 2 (two) times a day. For 10 days 01/21/19  Yes [provider]  doxycycline (VIBRAMYCIN) 100 MG capsule Take 100 mg by mouth 2 (two) times a day. For 7 days 01/16/19  Yes [provider]    Physical Exam:  Constitutional: Young female who appears very sick. Vitals:   01/26/19 0745 01/26/19 0800 01/26/19 0815 01/26/19 0830  BP: 114/77 124/75 114/76 112/77  Pulse: 85 85 89 86  Resp: (!) 24 (!) 25 (!) 24 (!) 26  Temp:      SpO2: 98% 99% 98% 97%  Height:       Eyes: PERRL, lids and conjunctivae normal ENMT: Mucous membranes are moist. Posterior pharynx clear of any exudate or lesions.Normal dentition.  Neck: normal, supple, no masses, no thyromegaly Respiratory: Tachypneic with shallow respirations.  Cannot appreciate any significant wheezes or rhonchi. Cardiovascular: Regular rate and rhythm, no murmurs / rubs / gallops. No extremity edema. 2+ pedal pulses. No carotid bruits.  Abdomen: no tenderness, no masses palpated. No hepatosplenomegaly. Bowel sounds positive.  Musculoskeletal: no clubbing / cyanosis. No joint deformity upper and lower extremities. Good ROM, no contractures. Normal muscle tone.  Skin: Diaphoretic erythema noted of the left lateral aspect of the breath with tenderness to palpation. Neurologic: CN 2-12 grossly intact. Sensation intact, DTR normal. Strength 5/5 in all 4.  Psychiatric: Normal judgment and insight. Alert and oriented x 3. Normal mood.     Labs on Admission: I have personally reviewed following labs and imaging studies  CBC: Recent Labs  Lab 01/26/19 0621  WBC 23.4*  NEUTROABS 18.5*  HGB 10.0*  HCT 30.2*  MCV 80.7  PLT 269   Basic Metabolic Panel: Recent Labs  Lab 01/26/19 0621  NA 139  K 2.8*  CL 107  CO2 20*    GLUCOSE 97  BUN 6  CREATININE 1.19*  CALCIUM 7.4*   GFR: CrCl cannot be calculated (Unknown ideal weight.). Liver Function Tests: Recent Labs  Lab 01/26/19 0621  AST 17  ALT 19  ALKPHOS 98  BILITOT 1.0  PROT 5.5*  ALBUMIN 2.0*   No results for input(s): LIPASE, AMYLASE in the last 168 hours. No results for input(s): AMMONIA in the last 168 hours. Coagulation Profile: No results for input(s): INR, PROTIME in the last 168 hours. Cardiac Enzymes: No results for input(s): CKTOTAL, CKMB, CKMBINDEX, TROPONINI in the last 168 hours. BNP (last 3 results) No results for input(s): PROBNP in the last 8760 hours. HbA1C: No results for input(s): HGBA1C in the last 72 hours. CBG: No results for input(s): GLUCAP in the last 168 hours. Lipid Profile: Recent Labs    01/26/19 0621  TRIG 93   Thyroid Function Tests: No results for input(s): TSH, T4TOTAL, FREET4, T3FREE, THYROIDAB in the last 72 hours. Anemia Panel: Recent Labs    01/26/19 0621  FERRITIN 206   Urine analysis: No results found for: COLORURINE, APPEARANCEUR, LABSPEC, PHURINE, GLUCOSEU, HGBUR, BILIRUBINUR, KETONESUR, PROTEINUR, UROBILINOGEN, NITRITE, LEUKOCYTESUR Sepsis Labs: Recent Results (from the past 240 hour(s))  SARS Coronavirus 2 (CEPHEID- Performed in [facility]  hospital lab), Hosp Order     Status: None   Collection Time: 01/26/19  5:50 AM   Specimen: Nasopharyngeal Swab  Result Value Ref Range Status   SARS Coronavirus 2 NEGATIVE NEGATIVE Final    Comment: (NOTE) If result is NEGATIVE SARS-CoV-2 target nucleic acids are NOT DETECTED. The SARS-CoV-2 RNA is generally detectable in upper and lower  respiratory specimens during the acute phase of infection. The lowest  concentration of SARS-CoV-2 viral copies this assay can detect is 250  copies / mL. A negative result does not preclude SARS-CoV-2 infection  and should not be used as the sole basis for treatment or other  patient management decisions.   A negative result may occur with  improper specimen collection / handling, submission of specimen other  than nasopharyngeal swab, presence of viral mutation(s) within the  areas targeted by this assay, and inadequate number of viral copies  (<250 copies / mL). A negative result must be combined with clinical  observations, patient history, and epidemiological information. If result is POSITIVE SARS-CoV-2 target nucleic acids are DETECTED. The SARS-CoV-2 RNA is generally detectable in upper and lower  respiratory specimens dur ing the acute phase of infection.  Positive  results are indicative of active infection with SARS-CoV-2.  Clinical  correlation with patient history and other diagnostic information is  necessary to determine patient infection status.  Positive results do  not rule out bacterial infection or co-infection with other viruses. If result is PRESUMPTIVE POSTIVE SARS-CoV-2 nucleic acids MAY BE PRESENT.   A presumptive positive result was obtained on the submitted specimen  and confirmed on repeat testing.  While 2019 novel coronavirus  (SARS-CoV-2) nucleic acids may be present in the submitted sample  additional confirmatory testing may be necessary for epidemiological  and / or clinical management purposes  to differentiate between  SARS-CoV-2 and other Sarbecovirus currently known to infect humans.  If clinically indicated additional testing with an alternate test  methodology [phone]) is advised. The SARS-CoV-2 RNA is generally  detectable in upper and lower respiratory sp ecimens during the acute  phase of infection. The expected result is Negative. Fact Sheet for Patients:  StrictlyIdeas.no Fact Sheet for Healthcare Providers: BankingDealers.co.za This test is not yet approved or cleared by the [place] FDA and has been authorized for detection and/or diagnosis of SARS-CoV-2 by FDA under an Emergency Use  Authorization (EUA).  This EUA will remain in effect (meaning this test can be used) for the duration of the COVID-19 declaration under Section 564(b)(1) of the Act, 21 U.S.C. section 360bbb-3(b)(1), unless the authorization is terminated or revoked sooner. Performed at [facility], [place] [address], [place], [place] [address]      Radiological Exams on Admission: No results found.  EKG: Independently reviewed.  Sinus rhythm 85 bpm  Assessment/Plan Sepsis secondary to mastitis: Acute.  Patient is a nonlactating female who presented as a transfer from [facility] for inflammation of the left breast.  She continues to be tachypneic and white blood cell count trending up 23.4.  Lactic acid remains reassuring at 0.7.  Despite antibiotics of vancomycin and Rocephin patient had been noted to have continued.  Imaging studies at the outside facility noted continued inflammation of the left breast with no drainable abscess noted.  Erythema and dimpling noted of the left lateral breast.  Transfer appears to have been requested for need of surgical consultative services.  Patient reports that breast tenderness has somewhat improved since start of IV  antibiotics. -Admit to a progressive bed -[facility] -Continue empiric antibiotics of vancomycin and Rocephin -IV morphine as needed for pain  -Infectious disease consulted Dr. Lorenso CourierPowers, we will follow for further recommendation -General surgery Dr. Janee Mornhompson consulted for evaluation of mastitis  Acute respiratory failure with hypoxia secondary healthcare associated pneumonia: Patient reports worsening shortness of breath over the last day.    CT of the chest revealed signs of a bilateral pneumonia, and did not report any signs above blood clot although study is not noted to sensitive for PE. Found to be COVID negative and she denies any recent exposures.  D-dimer was elevated at 1.98 at the outside facility. -Continuous  pulse oximetry with nasal cannula oxygen to maintain O2 saturations -Check respiratory virus panel and recheck COVID-19 send out lab  -Check sputum cultures if patient able to produce any sputum -Added on azithromycin for atypical coverage -Check nuclear medicine scan given acute kidney injury with Williamson creatinine and recent IV contrast given as CT may not visualize -Albuterol nebs 4 times daily -Solu-Medrol IV 60 mg every 8 hours -Incentive spirometry -Flutter valve -Mucinex   Transient hypotension: Resolved.  Patient had been temporarily placed on dopamine for reported low blood pressure, but was able to be weaned off since being here in the emergency department.  Suspect secondary to poor p.o. intake -Continue to monitor   Acute kidney injury: Patient's baseline creatinine previously noted to be 0.4 [facility], but elevated up to 1.19 on recheck today.  -IV fluids and normal saline at 75 mL/h -Recheck creatinine in a.m.  Hypokalemia: Acute.  Potassium noted to be 2.8 on admission. -Check magnesium level -Give total of 70 mEq IV potassium chloride as patient declined p.o. potassium. -Continue to monitor and replace as needed  Normocytic normochromic anemia: Chronic.  Hemoglobin 10 g/dL on admission with MCV and MCH at lower limits of normal. -Consider ferrous sulfate supplementation when medically appropriate  GERD prophylaxis: Protonix DVT prophylaxis: Lovenox Code Status:  Full Family Communication: Discussed plan of care with the patient's husband over the phone Disposition Plan: Hopefully discharge home in 3 to 4 days per Consults called: Infectious disease Admission status: inpatient  Clydie Braunondell A Arayah Krouse MD [facility] Pager [phone]   If 7PM-7AM, please contact night-coverage [email or web address] Password [email or web address]  01/26/2019, 9:43 AM

## 2019-01-26 NOTE — ED Notes (Signed)
The pt reminas alert and oriented

## 2019-01-26 NOTE — Consult Note (Signed)
Reason for Consult:L mastitis Referring Physician: Madelyn Flavors  Erica Williamson is an 28 y.o. female.  HPI: Otherwise healthy 28 year old female developed pain and swelling in her left breast around June 20.  She was prescribed oral antibiotics by her OB/GYN.  She did not have improvement and the antibiotics were changed.  She continued to have pain and swelling and was admitted to [facility] 7/8.  She was treated with IV antibiotics.  Blood cultures were taken.  She remained febrile.  She was seen by Dr. Tamala Bari in surgical consultation there.  Ultrasound revealed no abscess.  CT scan of the chest was done demonstrating left mastitis without abscess but also showed multifocal pneumonia.  She was transferred to [facility] for further treatment and I was asked to see her regarding her mastitis.  She claims that since the IV antibiotics were started, it is feeling better.  The medial portion of her breast is no longer swollen and tender.  The pain in the lateral portion of her breast is also gone down a lot though it is still swollen.  She has not been breast-feeding.  She has 2 children, 61 and 57 years old.  History reviewed. No pertinent past medical history.  Past Surgical History:  Procedure Laterality Date  . CESAREAN SECTION     X2    No family history on file.  Social History:  reports that she has never smoked. She has never used smokeless tobacco. She reports that she does not drink alcohol. No history on file for drug.  Allergies: No Known Allergies  Medications: I have reviewed the patient's current medications.  Results for orders placed or performed during the hospital encounter of 01/26/19 (from the past 48 hour(s))  SARS Coronavirus 2 (CEPHEID- Performed in [facility] hospital lab), Hosp Order     Status: None   Collection Time: 01/26/19  5:50 AM   Specimen: Nasopharyngeal Swab  Result Value Ref Range   SARS Coronavirus 2 NEGATIVE NEGATIVE    Comment: (NOTE)  If result is NEGATIVE SARS-CoV-2 target nucleic acids are NOT DETECTED. The SARS-CoV-2 RNA is generally detectable in upper and lower  respiratory specimens during the acute phase of infection. The lowest  concentration of SARS-CoV-2 viral copies this assay can detect is 250  copies / mL. A negative result does not preclude SARS-CoV-2 infection  and should not be used as the sole basis for treatment or other  patient management decisions.  A negative result may occur with  improper specimen collection / handling, submission of specimen other  than nasopharyngeal swab, presence of viral mutation(s) within the  areas targeted by this assay, and inadequate number of viral copies  (<250 copies / mL). A negative result must be combined with clinical  observations, patient history, and epidemiological information. If result is POSITIVE SARS-CoV-2 target nucleic acids are DETECTED. The SARS-CoV-2 RNA is generally detectable in upper and lower  respiratory specimens dur ing the acute phase of infection.  Positive  results are indicative of active infection with SARS-CoV-2.  Clinical  correlation with patient history and other diagnostic information is  necessary to determine patient infection status.  Positive results do  not rule out bacterial infection or co-infection with other viruses. If result is PRESUMPTIVE POSTIVE SARS-CoV-2 nucleic acids MAY BE PRESENT.   A presumptive positive result was obtained on the submitted specimen  and confirmed on repeat testing.  While 2019 novel coronavirus  (SARS-CoV-2) nucleic acids may be present in the  submitted sample  additional confirmatory testing may be necessary for epidemiological  and / or clinical management purposes  to differentiate between  SARS-CoV-2 and other Sarbecovirus currently known to infect humans.  If clinically indicated additional testing with an alternate test  methodology [phone]) is advised. The SARS-CoV-2 RNA is generally   detectable in upper and lower respiratory sp ecimens during the acute  phase of infection. The expected result is Negative. Fact Sheet for Patients:  StrictlyIdeas.no Fact Sheet for Healthcare Providers: BankingDealers.co.za This test is not yet approved or cleared by the [place] FDA and has been authorized for detection and/or diagnosis of SARS-CoV-2 by FDA under an Emergency Use Authorization (EUA).  This EUA will remain in effect (meaning this test can be used) for the duration of the COVID-19 declaration under Section 564(b)(1) of the Act, 21 U.S.C. section 360bbb-3(b)(1), unless the authorization is terminated or revoked sooner. Performed at [facility], [place] [address] [place], [place] [address]   Lactic acid, plasma     Status: None   Collection Time: 01/26/19  6:21 AM  Result Value Ref Range   Lactic Acid, Venous 0.7 0.5 - 1.9 mmol/L    Comment: Performed at [facility] [address], [place], [place] [address]  CBC WITH DIFFERENTIAL     Status: Abnormal   Collection Time: 01/26/19  6:21 AM  Result Value Ref Range   WBC 23.4 (H) 4.0 - 10.5 K/uL   RBC 3.74 (L) 3.87 - 5.11 MIL/uL   Hemoglobin 10.0 (L) 12.0 - 15.0 g/dL   HCT 30.2 (L) 36.0 - 46.0 %   MCV 80.7 80.0 - 100.0 fL   MCH 26.7 26.0 - 34.0 pg   MCHC 33.1 30.0 - 36.0 g/dL   RDW 14.7 11.5 - 15.5 %   Platelets 269 150 - 400 K/uL   nRBC 0.0 0.0 - 0.2 %   Neutrophils Relative % 79 %   Neutro Abs 18.5 (H) 1.7 - 7.7 K/uL   Lymphocytes Relative 10 %   Lymphs Abs 2.4 0.7 - 4.0 K/uL   Monocytes Relative 10 %   Monocytes Absolute 2.3 (H) 0.1 - 1.0 K/uL   Eosinophils Relative 0 %   Eosinophils Absolute 0.0 0.0 - 0.5 K/uL   Basophils Relative 0 %   Basophils Absolute 0.1 0.0 - 0.1 K/uL   Immature Granulocytes 1 %   Abs Immature Granulocytes 0.17 (H) 0.00 - 0.07 K/uL    Comment: Performed at [facility], [address], [place], [place] [address]   Comprehensive metabolic panel     Status: Abnormal   Collection Time: 01/26/19  6:21 AM  Result Value Ref Range   Sodium 139 135 - 145 mmol/L   Potassium 2.8 (L) 3.5 - 5.1 mmol/L   Chloride 107 98 - 111 mmol/L   CO2 20 (L) 22 - 32 mmol/L   Glucose, Bld 97 70 - 99 mg/dL   BUN 6 6 - 20 mg/dL   Creatinine, Ser 1.19 (H) 0.44 - 1.00 mg/dL   Calcium 7.4 (L) 8.9 - 10.3 mg/dL   Total Protein 5.5 (L) 6.5 - 8.1 g/dL   Albumin 2.0 (L) 3.5 - 5.0 g/dL   AST 17 15 - 41 U/L   ALT 19 0 - 44 U/L   Alkaline Phosphatase 98 38 - 126 U/L   Total Bilirubin 1.0 0.3 - 1.2 mg/dL   GFR calc non Af Amer >60 >60 mL/min   GFR calc Af  Amer >60 >60 mL/min   Anion gap 12 5 - 15    Comment: Performed at [facility], [address] [place], [place] [address]  D-dimer, quantitative     Status: Abnormal   Collection Time: 01/26/19  6:21 AM  Result Value Ref Range   D-Dimer, Quant 3.62 (H) 0.00 - 0.50 ug/mL-FEU    Comment: (NOTE) At the manufacturer cut-off of 0.50 ug/mL FEU, this assay has been documented to exclude PE with a sensitivity and negative predictive value of 97 to 99%.  At this time, this assay has not been approved by the FDA to exclude DVT/VTE. Results should be correlated with clinical presentation. Performed at [facility], [address] [place], [place] [address]   Procalcitonin     Status: None   Collection Time: 01/26/19  6:21 AM  Result Value Ref Range   Procalcitonin 0.22 ng/mL    Comment:        Interpretation: PCT (Procalcitonin) <= 0.5 ng/mL: Systemic infection (sepsis) is not likely. Local bacterial infection is possible. (NOTE)       Sepsis PCT Algorithm           Lower Respiratory Tract                                      Infection PCT Algorithm    ----------------------------     ----------------------------         PCT < 0.25 ng/mL                PCT < 0.10 ng/mL         Strongly encourage             Strongly discourage   discontinuation of antibiotics     initiation of antibiotics    ----------------------------     -----------------------------       PCT 0.25 - 0.50 ng/mL            PCT 0.10 - 0.25 ng/mL               OR       >80% decrease in PCT            Discourage initiation of                                            antibiotics      Encourage discontinuation           of antibiotics    ----------------------------     -----------------------------         PCT >= 0.50 ng/mL              PCT 0.26 - 0.50 ng/mL               AND        <80% decrease in PCT             Encourage initiation of                                             antibiotics       Encourage continuation  of antibiotics    ----------------------------     -----------------------------        PCT >= 0.50 ng/mL                  PCT > 0.50 ng/mL               AND         increase in PCT                  Strongly encourage                                      initiation of antibiotics    Strongly encourage escalation           of antibiotics                                     -----------------------------                                           PCT <= 0.25 ng/mL                                                 OR                                        > 80% decrease in PCT                                     Discontinue / Do not initiate                                             antibiotics Performed at [facility], [address] [place], [place] [address]   Lactate dehydrogenase     Status: Abnormal   Collection Time: 01/26/19  6:21 AM  Result Value Ref Range   LDH 197 (H) 98 - 192 U/L    Comment: Performed at [facility], [address] [place], [place] [address]  Ferritin     Status: None   Collection Time: 01/26/19  6:21 AM  Result Value Ref Range   Ferritin 206 11 - 307 ng/mL    Comment: Performed at [facility], [address] [place], [place] [address]  Fibrinogen     Status: Abnormal   Collection Time:  01/26/19  6:21 AM  Result Value Ref Range   Fibrinogen 743 (H) 210 - 475 mg/dL    Comment: Performed at [facility], [address] [place], [place] [address]  C-reactive protein     Status: Abnormal   Collection Time: 01/26/19  6:21 AM  Result Value Ref Range   CRP 29.1 (H) <1.0 mg/dL    Comment: Performed at [facility], [address] [place], [place] [address]  Triglycerides     Status: None   Collection Time: 01/26/19  6:21 AM  Result Value Ref Range   Triglycerides 93 <150 mg/dL    Comment: Performed at [facility], [address] [place], [place] [address]  I-Stat beta hCG blood, ED     Status: None   Collection Time: 01/26/19  6:48 AM  Result Value Ref Range   I-stat hCG, quantitative <5.0 <5 mIU/mL   Comment 3            Comment:   GEST. AGE      CONC.  (mIU/mL)   <=1 WEEK        5 - 50     2 WEEKS       50 - 500     3 WEEKS       100 - 10,000     4 WEEKS     1,000 - 30,000        FEMALE AND NON-PREGNANT FEMALE:     LESS THAN 5 mIU/mL   Lactic acid, plasma     Status: None   Collection Time: 01/26/19  8:40 AM  Result Value Ref Range   Lactic Acid, Venous 0.8 0.5 - 1.9 mmol/L    Comment: Performed at [facility], [address], [place], [place] [address]  Magnesium     Status: Abnormal   Collection Time: 01/26/19 10:14 AM  Result Value Ref Range   Magnesium 1.6 (L) 1.7 - 2.4 mg/dL    Comment: Performed at [facility], [address] [place], [place] [address]  Vancomycin, trough     Status: None   Collection Time: 01/26/19 12:35 PM  Result Value Ref Range   Vancomycin Tr 15 15 - 20 ug/mL    Comment: Performed at [facility], [address], [place], [place] [address]    Dg Chest 2 View  Result Date: 01/26/2019 CLINICAL DATA:  Community acquired pneumonia EXAM: CHEST - 2 VIEW COMPARISON:  01/25/2019 FINDINGS: Slight interval increase in small bilateral pleural effusions and associated bibasilar atelectasis or  consolidation. No new airspace opacity. IMPRESSION: Slight interval increase in small bilateral pleural effusions and associated bibasilar atelectasis or consolidation. No new airspace opacity. Electronically Signed   By: Lauralyn Primes M.D.   On: 01/26/2019 11:12   Nm Pulmonary Perfusion  Result Date: 01/26/2019 CLINICAL DATA:  Respiratory distress and elevated D-dimer EXAM: NUCLEAR MEDICINE PERFUSION LUNG SCAN VIEWS: Anterior, posterior, left lateral, right lateral, RPO, LPO, RAO, LAO RADIOPHARMACEUTICALS:  1.5 mCi Tc-59m MAA IV COMPARISON:  Chest radiograph January 26, 2019 FINDINGS: Radiotracer uptake is homogeneous and symmetric bilaterally. No perfusion defects are demonstrable. IMPRESSION: No appreciable perfusion defects. Very low probability of pulmonary embolus. Electronically Signed   By: Bretta Bang III M.D.   On: 01/26/2019 15:33    Review of Systems  Constitutional: Positive for fever.  HENT: Negative.   Eyes: Negative.   Respiratory: Positive for shortness of breath. Negative for cough and sputum production.   Gastrointestinal: Negative for abdominal pain, nausea and vomiting.  Genitourinary: Negative.   Musculoskeletal:       See HPI  Skin:       See HPI  Neurological: Negative.   Endo/Heme/Allergies: Negative.   Psychiatric/Behavioral: Negative.    Blood pressure 109/74, pulse 98, temperature 98 F (36.7 C), resp. rate 18, height  (1.626 m), weight 71.7  kg, last menstrual period 01/06/2019, SpO2 100 %. Physical Exam  Constitutional: She is oriented to person, place, and time. She appears well-developed and well-nourished. No distress.  HENT:  Head: Normocephalic and atraumatic.  Right Ear: External ear normal.  Mouth/Throat: Oropharynx is clear and moist.  Eyes: Pupils are equal, round, and reactive to light.  Neck: No tracheal deviation present. No thyromegaly present.  Cardiovascular: Normal rate, regular rhythm and normal heart sounds.  Respiratory: Effort  normal and breath sounds normal. No respiratory distress. She has no wheezes.    Left breast has no tenderness or edema medially, lateral portion has mild erythema and is quite edematous, no tenderness, no drainage from the skin, skin is intact  GI: Soft. She exhibits no distension. There is no abdominal tenderness. There is no rebound and no guarding.  Neurological: She is alert and oriented to person, place, and time. She exhibits normal muscle tone.  Skin: Skin is warm.  Psychiatric: She has a normal mood and affect.    Assessment/Plan: Left mastitis - It seems that it has been improving on IV antibiotics.  No need for surgical intervention.  Agree with continued broad-spectrum IV antibiotics and infectious disease consult.  We will follow.  I discussed the plan with her and her husband.  Multifocal pneumonia - per primary service and infectious disease  Liz MaladyBurke E Silvie Obremski 01/26/2019, 6:12 PM

## 2019-01-26 NOTE — ED Notes (Signed)
Pt asking for water to drink

## 2019-01-26 NOTE — ED Notes (Signed)
Dopamine drip reduced to 1 mcg/kg/mim at 2.70ml per dr Betsey Holiday

## 2019-01-27 DIAGNOSIS — R0602 Shortness of breath: Secondary | ICD-10-CM

## 2019-01-27 DIAGNOSIS — R112 Nausea with vomiting, unspecified: Secondary | ICD-10-CM

## 2019-01-27 DIAGNOSIS — R509 Fever, unspecified: Secondary | ICD-10-CM

## 2019-01-27 DIAGNOSIS — J90 Pleural effusion, not elsewhere classified: Secondary | ICD-10-CM

## 2019-01-27 DIAGNOSIS — D72829 Elevated white blood cell count, unspecified: Secondary | ICD-10-CM

## 2019-01-27 DIAGNOSIS — J9811 Atelectasis: Secondary | ICD-10-CM

## 2019-01-27 DIAGNOSIS — R079 Chest pain, unspecified: Secondary | ICD-10-CM

## 2019-01-27 DIAGNOSIS — N61 Mastitis without abscess: Secondary | ICD-10-CM

## 2019-01-27 LAB — BASIC METABOLIC PANEL
Anion gap: 7 (ref 5–15)
BUN: 8 mg/dL (ref 6–20)
CO2: 20 mmol/L — ABNORMAL LOW (ref 22–32)
Calcium: 7.6 mg/dL — ABNORMAL LOW (ref 8.9–10.3)
Chloride: 112 mmol/L — ABNORMAL HIGH (ref 98–111)
Creatinine, Ser: 0.9 mg/dL (ref 0.44–1.00)
GFR calc Af Amer: >60 mL/min (ref >60)
GFR calc non Af Amer: >60 mL/min (ref >60)
Glucose, Bld: 135 mg/dL — ABNORMAL HIGH (ref 70–99)
Potassium: 3.9 mmol/L (ref 3.5–5.1)
Sodium: 139 mmol/L (ref 135–145)

## 2019-01-27 LAB — LACTATE DEHYDROGENASE: LDH: 126 U/L (ref 98–192)

## 2019-01-27 LAB — RESPIRATORY PANEL BY PCR

## 2019-01-27 LAB — CBC
HCT: 29.7 % — ABNORMAL LOW (ref 36.0–46.0)
Hemoglobin: 10.4 g/dL — ABNORMAL LOW (ref 12.0–15.0)
MCH: 27.2 pg (ref 26.0–34.0)
MCHC: 35 g/dL (ref 30.0–36.0)
MCV: 77.5 fL — ABNORMAL LOW (ref 80.0–100.0)
Platelets: 313 10*3/uL (ref 150–400)
RBC: 3.83 MIL/uL — ABNORMAL LOW (ref 3.87–5.11)
RDW: 14.6 % (ref 11.5–15.5)
WBC: 20.2 10*3/uL — ABNORMAL HIGH (ref 4.0–10.5)
nRBC: 0 % (ref 0.0–0.2)

## 2019-01-27 LAB — HIV ANTIBODY (ROUTINE TESTING W REFLEX): HIV Screen 4th Generation wRfx: NONREACTIVE

## 2019-01-27 LAB — FERRITIN: Ferritin: 221 ng/mL (ref 11–307)

## 2019-01-27 LAB — PROCALCITONIN: Procalcitonin: <0.1 ng/mL

## 2019-01-27 LAB — GLUCOSE, CAPILLARY: Glucose-Capillary: 121 mg/dL — ABNORMAL HIGH (ref 70–99)

## 2019-01-27 LAB — SEDIMENTATION RATE: Sed Rate: 107 mm/hr — ABNORMAL HIGH (ref 0–22)

## 2019-01-27 LAB — MAGNESIUM: Magnesium: 2.2 mg/dL (ref 1.7–2.4)

## 2019-01-27 LAB — C-REACTIVE PROTEIN: CRP: 26.5 mg/dL — ABNORMAL HIGH (ref <1.0)

## 2019-01-27 MED ORDER — SODIUM CHLORIDE 0.9% FLUSH: 10.0000 mL | INTRAVENOUS | Status: DC | PRN

## 2019-01-27 MED ORDER — SODIUM CHLORIDE 0.9% FLUSH
10.0000 mL | Freq: Two times a day (BID) | INTRAVENOUS | Status: DC
  Administered 2019-01-27 – 2019-01-30 (×3): 10 mL

## 2019-01-27 MED ORDER — ALBUTEROL SULFATE (2.5 MG/3ML) 0.083% IN NEBU
2.5000 mg | INHALATION_SOLUTION | Freq: Three times a day (TID) | RESPIRATORY_TRACT | Status: DC
  Administered 2019-01-27: 20:00:00 2.5 mg via RESPIRATORY_TRACT
  Filled 2019-01-27 (×2): qty 3

## 2019-01-27 NOTE — Progress Notes (Signed)
Pt AOX4 and has been speaking with her family with updates. Reminded pt let me know if they had any questions or concerns.

## 2019-01-27 NOTE — Consult Note (Signed)
[facility]       Reason for Consult: Fever, leukocytosis    Referring Physician: Hughie Clossavi Pahwani, MD  Principal Problem:   Mastitis Active Problems:   Acute on chronic respiratory failure with hypoxia (HCC)   HCAP (healthcare-associated pneumonia)   Transient hypotension   AKI (acute kidney injury) (HCC)   Hypokalemia   Normocytic anemia    albuterol  2.5 mg Nebulization TID   enoxaparin (LOVENOX) injection  40 mg Subcutaneous Q24H   guaiFENesin  600 mg Oral BID   pantoprazole (PROTONIX) IV  40 mg Intravenous Q12H   sodium chloride flush  10-40 mL Intracatheter Q12H   sodium chloride flush  3 mL Intravenous Q12H    Recommendations: 1. Fever - given recent hypOTN requiring pressors and now paradoxical SOB/pleuritic CP, there is a significant concern for PE. Aspiration PNA given her recent emesis at the OSH certainly is a possibility. Viral myocarditis is also within the DDx as CoVID-19 testing was negative here. Certainly possible that mastitis could explain her initial fever as no fever has been documented here while she remains on broad spectrum ABX.  - check TTE - check Doppler U/S to R/O DVT - would consider checking CTA of chest to better assess for PE (V/Q scan was low probability). Reportedly, she had a CT of the chest at the OSH showing multifocal PNA. CXR here shows small bilateral pleural effusions with bibasilar consolidation. Her paradoxical SOB/RT sided pleuritic CP are concerning though. Her normal lactic acid level argues against sepsis as the cause of her fever and recent hypOTN. - continue empiric vancomycin/rocephin/azithromycin for now but might consider changing azithromycin to flagyl if unimproved by tomorrow given recent aspiration concern  2. LT breast mastitis - definite induration and tenderness along the patient's LT upper outer quadrant but no erythema is visible at present. She failed to respond to PO doxycycline and  later augmentin prior to her admission to the OSH. Per report, U/S of her LT breast confirmed only mastitis but showed no abscess. Surgery service has evaluated the patient here and deferred surgical intervention at this time. Given her family history of breast CA, this may need to be re-evaluated if she fails to improve following another 48 hrs of empiric broad spectrum IV ABX. Apply warm compresses to breast q 4 hrs PRN. Continue empiric vancomycin and rocephin for now.  3. Leukocytosis - In addition to the patient's mastitis, she was reportedly found to have bilateral/multifocal PNA on a CT of the chest at the OSH prior to her transfer here. Despite recent contact with multiple family visitors following a recent family death, her CoVID-19 PCR was negative here. The patient has not received any steroids recently to my knowledge. See above re: ABX. Check CBC w/ diff daily to follow WBC trend. If fails to improve, consider repeating CT of chest +/- dedicated breast imaging to R/O protected focus of infection/abscess.  Assessment: The patient is a 28 y/o [place] female with recent LT breast cellulitis refractory to PO ABX transferred from OSH for fever, leukocytosis, LT breast mastitis, and progressive dyspnea and concern for multifocal PNA.  Antibiotics: Vancomycin, day 5 Rocephin, day 5 Azithromycin, day 2  HPI: Erica Williamson is a 28 y.o. female with left breast tenderness/mastitis for the past 3 weeks who was [facility] on January 26, 2019 for fever, leukocytosis, shortness of breath and hypotension.  Patient states that around approximately June 20th she began  developing left breast tenderness.  She denies any direct or blunt trauma to her left breast or chest wall prior to the onset of this symptom.  She also has not been breast-feeding as her 2 children are ages 396 and [phone].  In late June, she presented to her OB GYNs office who diagnosed her with left breast  mastitis and gave her empiric doxycycline.  Her left breast continued to swell with streaking erythema, so to her OB/GYN changed her doxycycline to Augmentin in early July.  She took this medication for 2 days without further improvement but then developed fever to 103 at home, prompting her admission to [facility].  While there she was treated with IV vancomycin and Rocephin with some improvement in her left breast erythema and tenderness.  She developed nausea and vomiting as an inpatient and then became acutely short of breath on her second day of admission there.  Per report, an ultrasound of the left breast showed no abscess but her white blood cell count failed to improve.  The day prior to admission the patient was briefly on dopamine for further hypotension and she was ultimately transferred to our facility on July 11 due to concerns for possible COVID-19 as her chest CT scan there showed multifocal pneumonia.  Of note, I am unable to obtain any of her records from [facility] either via the paper chart or in Epic (neither her nurse nor myself could locate any "care everywhere" tab under [facility] for her chart).  The patient has been evaluated by the surgical service here at our facility who felt that the patient did not require any surgical intervention to her left breast at this time.  The patient actually has no erythema overlying her left breast but does have significant tenderness and induration at approximately 3 o'clock along the lateral edge of her breast.  No open wound or drainage is noted from the site.  Of note, the patient states that she has had multiple contacts with other individuals, some with respiratory complaints, in recent weeks as she had a death in the family.  As a result she hosted multiple family members including children within her home.  Her COVID-19 testing was negative upon arrival here.  She has been empirically treated with vancomycin and Rocephin and azithromycin thus far.   Chest imaging shows bibasilar pleural effusions and bibasilar atelectasis versus consolidation.  We were asked to evaluate the patient for her possible pneumonia, fever, leukocytosis, and left breast mastitis. H&P, CXR, lactic acid level, WBC trends, fever curve, and blood cx results all  independently reviewed  Review of Systems:  Review of Systems  Constitutional: Positive for fever. Negative for chills and weight loss.  HENT: Negative for congestion, hearing loss, sinus pain and sore throat.   Eyes: Negative for blurred vision, photophobia and discharge.  Respiratory: Positive for shortness of breath. Negative for cough and hemoptysis.   Cardiovascular: Positive for chest pain. Negative for palpitations, orthopnea and leg swelling.  Gastrointestinal: Positive for nausea and vomiting. Negative for abdominal pain, constipation, diarrhea and heartburn.  Genitourinary: Negative for dysuria, flank pain, frequency and urgency.  Musculoskeletal: Negative for back pain, joint pain and myalgias.  Skin: Negative for itching and rash.  Neurological: Negative for tremors, seizures, weakness and headaches.  Endo/Heme/Allergies: Negative for polydipsia. Does not bruise/bleed easily.  Psychiatric/Behavioral: Negative for depression and substance abuse. The patient is not nervous/anxious and does not have insomnia.      All other systems reviewed and  are negative    History reviewed. No pertinent past medical history.  G2P2 C/S x 2  Social History   Tobacco Use   Smoking status: Never Smoker   Smokeless tobacco: Never Used  Substance Use Topics   Alcohol use: Never    Frequency: Never   Drug use: Not on file    Family History: Mother - breast CA at age 70, DVT following dx of breast CA Maternal uncle - breast CA in his 54s  Current Facility-Administered Medications:    0.9 %  sodium chloride infusion, , Intravenous, Continuous, Smith, Rondell A, MD, Last Rate: 75 mL/hr at 01/27/19  0645   acetaminophen (TYLENOL) tablet 650 mg, 650 mg, Oral, Q6H PRN **OR** acetaminophen (TYLENOL) suppository 650 mg, 650 mg, Rectal, Q6H PRN, Tamala Julian, Rondell A, MD   albuterol (PROVENTIL) (2.5 MG/3ML) 0.083% nebulizer solution 2.5 mg, 2.5 mg, Nebulization, Q6H PRN, Tamala Julian, Rondell A, MD   albuterol (PROVENTIL) (2.5 MG/3ML) 0.083% nebulizer solution 2.5 mg, 2.5 mg, Nebulization, TID, Pahwani, Ravi, MD   azithromycin (ZITHROMAX) 500 mg in sodium chloride 0.9 % 250 mL IVPB, 500 mg, Intravenous, Q24H, Smith, Rondell A, MD, Last Rate: 250 mL/hr at 01/27/19 1413, 500 mg at 01/27/19 1413   cefTRIAXone (ROCEPHIN) 1 g in sodium chloride 0.9 % 100 mL IVPB, 1 g, Intravenous, Q24H, Smith, Rondell A, MD, Last Rate: 200 mL/hr at 01/27/19 1157, 1 g at 01/27/19 1157   enoxaparin (LOVENOX) injection 40 mg, 40 mg, Subcutaneous, Q24H, Smith, Rondell A, MD, 40 mg at 01/27/19 0930   guaiFENesin (MUCINEX) 12 hr tablet 600 mg, 600 mg, Oral, BID, Smith, Rondell A, MD, 600 mg at 01/27/19 0930   morphine 2 MG/ML injection 2 mg, 2 mg, Intravenous, Q3H PRN, Tamala Julian, Rondell A, MD, 2 mg at 01/26/19 2343   ondansetron (ZOFRAN) tablet 4 mg, 4 mg, Oral, Q6H PRN **OR** ondansetron (ZOFRAN) injection 4 mg, 4 mg, Intravenous, Q6H PRN, Tamala Julian, Rondell A, MD, 4 mg at 01/27/19 0322   pantoprazole (PROTONIX) injection 40 mg, 40 mg, Intravenous, Q12H, Smith, Rondell A, MD, 40 mg at 01/27/19 0930   sodium chloride flush (NS) 0.9 % injection 10-40 mL, 10-40 mL, Intracatheter, Q12H, Pahwani, Ravi, MD   sodium chloride flush (NS) 0.9 % injection 10-40 mL, 10-40 mL, Intracatheter, PRN, Pahwani, Ravi, MD   sodium chloride flush (NS) 0.9 % injection 3 mL, 3 mL, Intravenous, Q12H, Smith, Rondell A, MD, 3 mL at 01/26/19 2132   vancomycin (VANCOCIN) 1,500 mg in sodium chloride 0.9 % 500 mL IVPB, 1,500 mg, Intravenous, Q24H, Pham, Mimi L, RPH, Stopped at 01/27/19 0000  No Known Allergies  Vitals:   01/27/19 1125 01/27/19 1422  BP:   116/82  Pulse:  94  Resp:  (!) 21  Temp:  98.1 F (36.7 C)  SpO2: 98% 100%     Physical Exam Gen: pleasant, moderate distress secondary to pleuritic CP and dyspnea, significant prostration evident, A&Ox 3 Head: NCAT, no temporal wasting evident EENT: PERRL, EOMI, MMM, adequate dentition Neck: supple, no JVD CV: NRRR, no murmurs evident Chest Wall: firm induration at 3 o'clock along LT breast with +tenderness to palpation, no erythema or dimpling evident, no nipple discharge elicited Pulm: fine RT basilar crackles, OTW CTA, +rectractions, tachypnea, and nasal flaring when asked to lean forward, no wheeze Abd: soft, NTND, +BS Extrems:  no LE edema, 2+ pulses Skin: no rashes, adequate skin turgor Neuro: CN II-XII grossly intact, no focal neurologic deficits appreciated, gait was not assessed, A&Ox 3, +  anxious  Lab Results  Component Value Date   WBC 20.2 (H) 01/27/2019   HGB 10.4 (L) 01/27/2019   HCT 29.7 (L) 01/27/2019   MCV 77.5 (L) 01/27/2019   PLT 313 01/27/2019    Lab Results  Component Value Date   CREATININE 0.90 01/27/2019   BUN 8 01/27/2019   NA 139 01/27/2019   K 3.9 01/27/2019   CL 112 (H) 01/27/2019   CO2 20 (L) 01/27/2019    Lab Results  Component Value Date   ALT 19 01/26/2019   AST 17 01/26/2019   ALKPHOS 98 01/26/2019     Microbiology: Recent Results (from the past 240 hour(s))  SARS Coronavirus 2 (CEPHEID- Performed in [facility] hospital lab), Hosp Order     Status: None   Collection Time: 01/26/19  5:50 AM   Specimen: Nasopharyngeal Swab  Result Value Ref Range Status   SARS Coronavirus 2 NEGATIVE NEGATIVE Final    Comment: (NOTE) If result is NEGATIVE SARS-CoV-2 target nucleic acids are NOT DETECTED. The SARS-CoV-2 RNA is generally detectable in upper and lower  respiratory specimens during the acute phase of infection. The lowest  concentration of SARS-CoV-2 viral copies this assay can detect is 250  copies / mL. A negative result does not  preclude SARS-CoV-2 infection  and should not be used as the sole basis for treatment or other  patient management decisions.  A negative result may occur with  improper specimen collection / handling, submission of specimen other  than nasopharyngeal swab, presence of viral mutation(s) within the  areas targeted by this assay, and inadequate number of viral copies  (<250 copies / mL). A negative result must be combined with clinical  observations, patient history, and epidemiological information. If result is POSITIVE SARS-CoV-2 target nucleic acids are DETECTED. The SARS-CoV-2 RNA is generally detectable in upper and lower  respiratory specimens dur ing the acute phase of infection.  Positive  results are indicative of active infection with SARS-CoV-2.  Clinical  correlation with patient history and other diagnostic information is  necessary to determine patient infection status.  Positive results do  not rule out bacterial infection or co-infection with other viruses. If result is PRESUMPTIVE POSTIVE SARS-CoV-2 nucleic acids MAY BE PRESENT.   A presumptive positive result was obtained on the submitted specimen  and confirmed on repeat testing.  While 2019 novel coronavirus  (SARS-CoV-2) nucleic acids may be present in the submitted sample  additional confirmatory testing may be necessary for epidemiological  and / or clinical management purposes  to differentiate between  SARS-CoV-2 and other Sarbecovirus currently known to infect humans.  If clinically indicated additional testing with an alternate test  methodology [phone]([id number]) is advised. The SARS-CoV-2 RNA is generally  detectable in upper and lower respiratory sp ecimens during the acute  phase of infection. The expected result is Negative. Fact Sheet for Patients:  BoilerBrush.com.cyhttps://www.fda.gov/media/136312/download Fact Sheet for Healthcare Providers: https://pope.com/https://www.fda.gov/media/136313/download This test is not yet approved or cleared by  the [place] FDA and has been authorized for detection and/or diagnosis of SARS-CoV-2 by FDA under an Emergency Use Authorization (EUA).  This EUA will remain in effect (meaning this test can be used) for the duration of the COVID-19 declaration under Section 564(b)(1) of the Act, 21 U.S.C. section 360bbb-3(b)(1), unless the authorization is terminated or revoked sooner. Performed at [facility], [address] [place], [place] [address]   Respiratory Panel by PCR     Status: None   Collection  Time: 01/26/19  6:58 PM   Specimen: Nasopharyngeal Swab; Respiratory  Result Value Ref Range Status   Adenovirus NOT DETECTED NOT DETECTED Final   Coronavirus 229E NOT DETECTED NOT DETECTED Final    Comment: (NOTE) The Coronavirus on the Respiratory Panel, DOES NOT test for the novel  Coronavirus (2019 nCoV)    Coronavirus HKU1 NOT DETECTED NOT DETECTED Final   Coronavirus NL63 NOT DETECTED NOT DETECTED Final   Coronavirus OC43 NOT DETECTED NOT DETECTED Final   Metapneumovirus NOT DETECTED NOT DETECTED Final   Rhinovirus / Enterovirus NOT DETECTED NOT DETECTED Final   Influenza A NOT DETECTED NOT DETECTED Final   Influenza B NOT DETECTED NOT DETECTED Final   Parainfluenza Virus 1 NOT DETECTED NOT DETECTED Final   Parainfluenza Virus 2 NOT DETECTED NOT DETECTED Final   Parainfluenza Virus 3 NOT DETECTED NOT DETECTED Final   Parainfluenza Virus 4 NOT DETECTED NOT DETECTED Final   Respiratory Syncytial Virus NOT DETECTED NOT DETECTED Final   Bordetella pertussis NOT DETECTED NOT DETECTED Final   Chlamydophila pneumoniae NOT DETECTED NOT DETECTED Final   Mycoplasma pneumoniae NOT DETECTED NOT DETECTED Final    Comment: Performed at [facility], [address], [place], [place] [address]    Belleville Lions, MD [facility] [email or web address] 01/27/2019, 2:48 PM

## 2019-01-27 NOTE — Progress Notes (Signed)
PROGRESS NOTE    Erica Williamson  OIT:[id number] DOB: [birth year] DOA: 01/26/2019 PCP: Patient, No Pcp Per   Brief Narrative:  Erica Williamson is a 28 y.o. female without significant past medical history; who presented breast discomfort and enlargement.  Patient reported that she initially noted a lump on her left breast on June 24.  She followed up with Dr. Adah Perl of OB/GYN 6 days later, and was started started on antibiotics.  Treated with doxycycline and subsequently Augmentin without any improvement in symptoms.  Associated symptoms included fevers and chills. Patient had been sent for ultrasound of the breast on 7/8, which revealed worsening infection and with no signs of an abscess.  She was advised to come to the emergency room at [facility] on 7/8, where she was admitted into the hospital at that time.  Blood cultures were obtained along with placing the patient on empiric antibiotics of vancomycin and Rocephin for treatment of sepsis secondary to mastitis of the left breast.  Vital signs were noted to be stable at that time.  Admission lab work revealed WBC 20.4, hemoglobin 13.5, platelets 336, creatinine 0.46, and lactic acid 0.8.  Despite antibiotics patient reportedly still continued to spike fever up 103 F and showed worsening white blood cell count.  Patient reportedly became more short of breath on around 17 2020. Labs on 7/10 revealed WBC 22, hemoglobin 10.2, creatinine 1.5, troponin negative, and d-dimer 1.98.  CT scan of the chest with contrast was obtained showing signs of a multifocal pneumonia and left mastitis without signs of fluid collection.   Patient was noted to be hypotensive and temporarily placed on dopamine and was on nasal cannula oxygen.  Patient was transferred prior to COVID-19 testing as there was a 24-hour turnaround time.  Transfer was requested and patient had initially been transferred to ED here at [facility].  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  She really has not had a cough but has felt short of breath to the point which makes her cough.  She complained of heaviness in her chest and pain with taking deep inspiratory breaths.  Upon further questioning, she tells me that her father-in-law who was suffering from lung cancer died of pneumonia just 3 weeks ago and the lived in the same house.  That is the only contact she has.  There is no other family member sick with similar pneumonia or any respiratory symptoms.  When she arrived here at emergency department, she was hypoxic requiring 4 L of nasal cannula with white cells of 23,000.  Also hypokalemia with potassium of 2.8 and acute kidney kidney with creatinine of 1.19.  She was tested negative for COVID and was admitted under hospitalist service and was placed on IV Rocephin, Zithromax and vancomycin as well as Solu-Medrol.  General surgery and infectious disease were consulted.  General surgery recommended no surgical intervention.  Consultants:   General surgery  Infectious disease  Procedures:   None  Antimicrobials:   IV Rocephin, Zithromax and vancomycin since 01/26/2019   Subjective: Patient seen and examined.  Interestingly, patient is from [place] and prefers to speak Urdu which I am feeling in and for that reason, we spoke in that language for most part.  She states that she feels much better today.  Although she still has significant shortness of breath and she feels short of breath after talking couple of sentences with me.  She also is having hard time and chest pain with  taking deep breaths.  She tells me that her left breast is approximately 60% better.  Objective: Vitals:   01/26/19 2141 01/27/19 0330 01/27/19 0429 01/27/19 0805  BP: 112/75 113/82    Pulse: 93 84  91  Resp: 18 (!) 27  14  Temp: 97.7 F (36.5 C) 97.6 F (36.4 C) 97.6 F (36.4 C) 97.7 F (36.5 C)  TempSrc: Oral Rectal Oral Oral  SpO2: 99% 96%  97%  Weight:      Height:          Intake/Output Summary (Last 24 hours) at 01/27/2019 1007 Last data filed at 01/27/2019 1610 Gross per 24 hour  Intake 1364.94 ml  Output --  Net 1364.94 ml   Filed Weights   01/26/19 1100  Weight: 71.7 kg    Examination:  Examination was performed in presence of patient's nurse as a chaperone General exam: Appears calm and comfortable  Respiratory system: Diminished breath sounds with no rhonchi and poor inspiratory effort Breast exam: On examination, right breast was normal however the left breast seemed edematous compared to the right.  She had approximately 2 x 2 inches smooth swelling under left lateral areola which was slightly tender to palpation but not warm. Cardiovascular system: S1 & S2 heard, RRR. No JVD, murmurs, rubs, gallops or clicks. No pedal edema. Gastrointestinal system: Abdomen is nondistended, soft and nontender. No organomegaly or masses felt. Normal bowel sounds heard. Central nervous system: Alert and oriented. No focal neurological deficits. Extremities: Symmetric 5 x 5 power. Skin: No rashes, lesions or ulcers Psychiatry: Judgement and insight appear normal. Mood & affect appropriate.    Data Reviewed: I have personally reviewed following labs and imaging studies  CBC: Recent Labs  Lab 01/26/19 0621 01/27/19 0305  WBC 23.4* 20.2*  NEUTROABS 18.5*  --   HGB 10.0* 10.4*  HCT 30.2* 29.7*  MCV 80.7 77.5*  PLT 269 960   Basic Metabolic Panel: Recent Labs  Lab 01/26/19 0621 01/26/19 1014 01/27/19 0305  NA 139  --  139  K 2.8*  --  3.9  CL 107  --  112*  CO2 20*  --  20*  GLUCOSE 97  --  135*  BUN 6  --  8  CREATININE 1.19*  --  0.90  CALCIUM 7.4*  --  7.6*  MG  --  1.6* 2.2   GFR: Estimated Creatinine Clearance: 89.5 mL/min (by C-G formula based on SCr of 0.9 mg/dL). Liver Function Tests: Recent Labs  Lab 01/26/19 0621  AST 17  ALT 19  ALKPHOS 98  BILITOT 1.0  PROT 5.5*  ALBUMIN 2.0*   No results for input(s): LIPASE,  AMYLASE in the last 168 hours. No results for input(s): AMMONIA in the last 168 hours. Coagulation Profile: No results for input(s): INR, PROTIME in the last 168 hours. Cardiac Enzymes: No results for input(s): CKTOTAL, CKMB, CKMBINDEX, TROPONINI in the last 168 hours. BNP (last 3 results) No results for input(s): PROBNP in the last 8760 hours. HbA1C: No results for input(s): HGBA1C in the last 72 hours. CBG: Recent Labs  Lab 01/27/19 0339  GLUCAP 121*   Lipid Profile: Recent Labs    01/26/19 0621  TRIG 93   Thyroid Function Tests: No results for input(s): TSH, T4TOTAL, FREET4, T3FREE, THYROIDAB in the last 72 hours. Anemia Panel: Recent Labs    01/26/19 0621  FERRITIN 206   Sepsis Labs: Recent Labs  Lab 01/26/19 0621 01/26/19 0840  PROCALCITON 0.22  --  LATICACIDVEN 0.7 0.8    Recent Results (from the past 240 hour(s))  SARS Coronavirus 2 (CEPHEID- Performed in [facility] hospital lab), Hosp Order     Status: None   Collection Time: 01/26/19  5:50 AM   Specimen: Nasopharyngeal Swab  Result Value Ref Range Status   SARS Coronavirus 2 NEGATIVE NEGATIVE Final    Comment: (NOTE) If result is NEGATIVE SARS-CoV-2 target nucleic acids are NOT DETECTED. The SARS-CoV-2 RNA is generally detectable in upper and lower  respiratory specimens during the acute phase of infection. The lowest  concentration of SARS-CoV-2 viral copies this assay can detect is 250  copies / mL. A negative result does not preclude SARS-CoV-2 infection  and should not be used as the sole basis for treatment or other  patient management decisions.  A negative result may occur with  improper specimen collection / handling, submission of specimen other  than nasopharyngeal swab, presence of viral mutation(s) within the  areas targeted by this assay, and inadequate number of viral copies  (<250 copies / mL). A negative result must be combined with clinical  observations, patient history, and  epidemiological information. If result is POSITIVE SARS-CoV-2 target nucleic acids are DETECTED. The SARS-CoV-2 RNA is generally detectable in upper and lower  respiratory specimens dur ing the acute phase of infection.  Positive  results are indicative of active infection with SARS-CoV-2.  Clinical  correlation with patient history and other diagnostic information is  necessary to determine patient infection status.  Positive results do  not rule out bacterial infection or co-infection with other viruses. If result is PRESUMPTIVE POSTIVE SARS-CoV-2 nucleic acids MAY BE PRESENT.   A presumptive positive result was obtained on the submitted specimen  and confirmed on repeat testing.  While 2019 novel coronavirus  (SARS-CoV-2) nucleic acids may be present in the submitted sample  additional confirmatory testing may be necessary for epidemiological  and / or clinical management purposes  to differentiate between  SARS-CoV-2 and other Sarbecovirus currently known to infect humans.  If clinically indicated additional testing with an alternate test  methodology [phone]) is advised. The SARS-CoV-2 RNA is generally  detectable in upper and lower respiratory sp ecimens during the acute  phase of infection. The expected result is Negative. Fact Sheet for Patients:  StrictlyIdeas.no Fact Sheet for Healthcare Providers: BankingDealers.co.za This test is not yet approved or cleared by the [place] FDA and has been authorized for detection and/or diagnosis of SARS-CoV-2 by FDA under an Emergency Use Authorization (EUA).  This EUA will remain in effect (meaning this test can be used) for the duration of the COVID-19 declaration under Section 564(b)(1) of the Act, 21 U.S.C. section 360bbb-3(b)(1), unless the authorization is terminated or revoked sooner. Performed at [facility], [place] [address], [place], [place] [address]   Respiratory  Panel by PCR     Status: None   Collection Time: 01/26/19  6:58 PM   Specimen: Nasopharyngeal Swab; Respiratory  Result Value Ref Range Status   Adenovirus NOT DETECTED NOT DETECTED Final   Coronavirus 229E NOT DETECTED NOT DETECTED Final    Comment: (NOTE) The Coronavirus on the Respiratory Panel, DOES NOT test for the novel  Coronavirus (2019 nCoV)    Coronavirus HKU1 NOT DETECTED NOT DETECTED Final   Coronavirus NL63 NOT DETECTED NOT DETECTED Final   Coronavirus OC43 NOT DETECTED NOT DETECTED Final   Metapneumovirus NOT DETECTED NOT DETECTED Final   Rhinovirus / Enterovirus NOT DETECTED NOT DETECTED Final  Influenza A NOT DETECTED NOT DETECTED Final   Influenza B NOT DETECTED NOT DETECTED Final   Parainfluenza Virus 1 NOT DETECTED NOT DETECTED Final   Parainfluenza Virus 2 NOT DETECTED NOT DETECTED Final   Parainfluenza Virus 3 NOT DETECTED NOT DETECTED Final   Parainfluenza Virus 4 NOT DETECTED NOT DETECTED Final   Respiratory Syncytial Virus NOT DETECTED NOT DETECTED Final   Bordetella pertussis NOT DETECTED NOT DETECTED Final   Chlamydophila pneumoniae NOT DETECTED NOT DETECTED Final   Mycoplasma pneumoniae NOT DETECTED NOT DETECTED Final    Comment: Performed at [facility], [place] [address] [place],  [address]      Radiology Studies: Dg Chest 2 View  Result Date: 01/26/2019 CLINICAL DATA:  Community acquired pneumonia EXAM: CHEST - 2 VIEW COMPARISON:  01/25/2019 FINDINGS: Slight interval increase in small bilateral pleural effusions and associated bibasilar atelectasis or consolidation. No new airspace opacity. IMPRESSION: Slight interval increase in small bilateral pleural effusions and associated bibasilar atelectasis or consolidation. No new airspace opacity. Electronically Signed   By: Eddie Candle M.D.   On: 01/26/2019 11:12   Nm Pulmonary Perfusion  Result Date: 01/26/2019 CLINICAL DATA:  Respiratory distress and elevated D-dimer EXAM: NUCLEAR MEDICINE  PERFUSION LUNG SCAN VIEWS: Anterior, posterior, left lateral, right lateral, RPO, LPO, RAO, LAO RADIOPHARMACEUTICALS:  1.5 mCi Tc-55mMAA IV COMPARISON:  Chest radiograph January 26, 2019 FINDINGS: Radiotracer uptake is homogeneous and symmetric bilaterally. No perfusion defects are demonstrable. IMPRESSION: No appreciable perfusion defects. Very low probability of pulmonary embolus. Electronically Signed   By: WLowella GripIII M.D.   On: 01/26/2019 15:33    Scheduled Meds:  albuterol  2.5 mg Nebulization QID   enoxaparin (LOVENOX) injection  40 mg Subcutaneous Q24H   guaiFENesin  600 mg Oral BID   pantoprazole (PROTONIX) IV  40 mg Intravenous Q12H   sodium chloride flush  3 mL Intravenous Q12H   Continuous Infusions:  sodium chloride 75 mL/hr at 01/27/19 0645   azithromycin Stopped (01/26/19 1654)   cefTRIAXone (ROCEPHIN)  IV Stopped (01/26/19 1405)   vancomycin Stopped (01/27/19 0000)     LOS: 1 day   Assessment & Plan:   Principal Problem:   Mastitis Active Problems:   Acute on chronic respiratory failure with hypoxia (HCC)   HCAP (healthcare-associated pneumonia)   Transient hypotension   AKI (acute kidney injury) (HKenefic   Hypokalemia   Normocytic anemia   Acute hypoxic respiratory failure and sepsis secondary to multifocal/bilateral pneumonia: She met sepsis criteria based on leukocytosis in addition to tachycardia.  Patient is still requiring 4 L of oxygen and chest pain with deep inspiration.  Leukocytosis slightly improved.  She is afebrile.  Although I do not suspect MRSA infection however at this point in time I will continue Rocephin, Zithromax as well as vancomycin and will let ID see her and decide further.  All the cultures are negative so far.  I will also order ESR, CRP, ferritin and LDH.  She is tested negative for COVID-19.  I will discontinue Solu-Medrol.  Sepsis secondary to left breast mastitis: The above-mentioned antibiotics.  She was evaluated by  surgery and no further recommendations for intervention were done.  She is yet to be seen by ID.   DVT prophylaxis: Lovenox Code Status: Full code Family Communication: None present at bedside.  Patient verbalized understanding of our conversation. Disposition Plan: To be determined however I expect potential discharge in next 48 hours.   Time spent: 39 minutes  Darliss Cheney, MD [facility] Pager [phone]  If 7PM-7AM, please contact night-coverage [email or web address] Password [facility] 01/27/2019, 10:07 AM

## 2019-01-28 ENCOUNTER — Inpatient Hospital Stay (HOSPITAL_COMMUNITY): Payer: Self-pay

## 2019-01-28 ENCOUNTER — Encounter (HOSPITAL_COMMUNITY): Payer: Self-pay | Admitting: Radiology

## 2019-01-28 DIAGNOSIS — J189 Pneumonia, unspecified organism: Secondary | ICD-10-CM

## 2019-01-28 LAB — COMPREHENSIVE METABOLIC PANEL
ALT: 19 U/L (ref 0–44)
AST: 31 U/L (ref 15–41)
Albumin: 2 g/dL — ABNORMAL LOW (ref 3.5–5.0)
Alkaline Phosphatase: 66 U/L (ref 38–126)
Anion gap: 8 (ref 5–15)
BUN: 15 mg/dL (ref 6–20)
CO2: 20 mmol/L — ABNORMAL LOW (ref 22–32)
Calcium: 8.2 mg/dL — ABNORMAL LOW (ref 8.9–10.3)
Chloride: 113 mmol/L — ABNORMAL HIGH (ref 98–111)
Creatinine, Ser: 0.93 mg/dL (ref 0.44–1.00)
GFR calc Af Amer: >60 mL/min (ref >60)
GFR calc non Af Amer: >60 mL/min (ref >60)
Glucose, Bld: 145 mg/dL — ABNORMAL HIGH (ref 70–99)
Potassium: 3.9 mmol/L (ref 3.5–5.1)
Sodium: 141 mmol/L (ref 135–145)
Total Bilirubin: 0.4 mg/dL (ref 0.3–1.2)
Total Protein: 5.1 g/dL — ABNORMAL LOW (ref 6.5–8.1)

## 2019-01-28 LAB — CBC WITH DIFFERENTIAL/PLATELET
Abs Immature Granulocytes: 0.8 10*3/uL — ABNORMAL HIGH (ref 0.00–0.07)
Basophils Absolute: 0 10*3/uL (ref 0.0–0.1)
Basophils Relative: 0 %
Eosinophils Absolute: 0 10*3/uL (ref 0.0–0.5)
Eosinophils Relative: 0 %
HCT: 27.4 % — ABNORMAL LOW (ref 36.0–46.0)
Hemoglobin: 9.6 g/dL — ABNORMAL LOW (ref 12.0–15.0)
Immature Granulocytes: 3 %
Lymphocytes Relative: 10 %
Lymphs Abs: 2.6 10*3/uL (ref 0.7–4.0)
MCH: 26.7 pg (ref 26.0–34.0)
MCHC: 35 g/dL (ref 30.0–36.0)
MCV: 76.3 fL — ABNORMAL LOW (ref 80.0–100.0)
Monocytes Absolute: 1.3 10*3/uL — ABNORMAL HIGH (ref 0.1–1.0)
Monocytes Relative: 6 %
Neutro Abs: 19.8 10*3/uL — ABNORMAL HIGH (ref 1.7–7.7)
Neutrophils Relative %: 81 %
Platelets: 360 10*3/uL (ref 150–400)
RBC: 3.59 MIL/uL — ABNORMAL LOW (ref 3.87–5.11)
RDW: 14.5 % (ref 11.5–15.5)
WBC: 24.5 10*3/uL — ABNORMAL HIGH (ref 4.0–10.5)
nRBC: 0 % (ref 0.0–0.2)

## 2019-01-28 LAB — MRSA PCR SCREENING: MRSA by PCR: NEGATIVE

## 2019-01-28 MED ORDER — ALBUTEROL SULFATE (2.5 MG/3ML) 0.083% IN NEBU
2.5000 mg | INHALATION_SOLUTION | Freq: Two times a day (BID) | RESPIRATORY_TRACT | Status: DC
  Administered 2019-01-28: 20:00:00 2.5 mg via RESPIRATORY_TRACT
  Filled 2019-01-28 (×2): qty 3

## 2019-01-28 MED ORDER — IOHEXOL 350 MG/ML SOLN
80.0000 mL | Freq: Once | INTRAVENOUS | Status: AC | PRN
  Administered 2019-01-28: 50 mL via INTRAVENOUS

## 2019-01-28 NOTE — Progress Notes (Signed)
PROGRESS NOTE    Erica Williamson  MNO:[id number] DOB: 1990/10/29 DOA: 01/26/2019 PCP: Patient, No Pcp Per   Brief Narrative:  Erica Williamson is a 28 y.o. female without significant past medical history; who presented breast discomfort and enlargement.  Patient reported that she initially noted a lump on her left breast on June 24.  She followed up with Dr. Adah Perl of OB/GYN 6 days later, and was started started on antibiotics.  Treated with doxycycline and subsequently Augmentin without any improvement in symptoms.  Associated symptoms included fevers and chills. Patient had been sent for ultrasound of the breast on 7/8, which revealed worsening infection and with no signs of an abscess.  She was advised to come to the emergency room at [facility] on 7/8, where she was admitted into the hospital at that time.  Blood cultures were obtained along with placing the patient on empiric antibiotics of vancomycin and Rocephin for treatment of sepsis secondary to mastitis of the left breast.  Vital signs were noted to be stable at that time.  Admission lab work revealed WBC 20.4, hemoglobin 13.5, platelets 336, creatinine 0.46, and lactic acid 0.8.  Despite antibiotics patient reportedly still continued to spike fever up 103 F and showed worsening white blood cell count.  Patient reportedly became more short of breath on around 17 2020. Labs on 7/10 revealed WBC 22, hemoglobin 10.2, creatinine 1.5, troponin negative, and d-dimer 1.98.  CT scan of the chest with contrast was obtained showing signs of a multifocal pneumonia and left mastitis without signs of fluid collection.   Patient was noted to be hypotensive and temporarily placed on dopamine and was on nasal cannula oxygen.  Patient was transferred prior to COVID-19 testing as there was a 24-hour turnaround time.  Transfer was requested and patient had initially been transferred to ED here at [facility].  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  She really has not had a cough but has felt short of breath to the point which makes her cough.  She complained of heaviness in her chest and pain with taking deep inspiratory breaths.  Upon further questioning, she tells me that her father-in-law who was suffering from lung cancer died of pneumonia just 3 weeks ago and the lived in the same house.  That is the only contact she has.  There is no other family member sick with similar pneumonia or any respiratory symptoms.  When she arrived here at emergency department, she was hypoxic requiring 4 L of nasal cannula with white cells of 23,000.  Also hypokalemia with potassium of 2.8 and acute kidney kidney with creatinine of 1.19.  She was tested negative for COVID and was admitted under hospitalist service and was placed on IV Rocephin, Zithromax and vancomycin as well as Solu-Medrol.  General surgery and infectious disease were consulted.  General surgery recommended no surgical intervention.  Consultants:   General surgery  Infectious disease  Procedures:   None  Antimicrobials:   IV Rocephin, Zithromax and vancomycin since 01/26/2019   Subjective: Patient seen and examined.  She states that she overall feels better however she also feels that her breathing is slightly worse sometimes compared to yesterday.  Her left breast is improved.  No new complaint.  She is remained afebrile.  Objective: Vitals:   01/27/19 2002 01/27/19 2221 01/27/19 2328 01/28/19 0313  BP: 120/80 113/81  109/79  Pulse: 85 82  66  Resp: '17 20  20  ' Temp: Marland Kitchen)  97.2 F (36.2 C)  97.6 F (36.4 C)   TempSrc: Oral  Oral   SpO2: 99% 98%  98%  Weight:      Height:        Intake/Output Summary (Last 24 hours) at 01/28/2019 1227 Last data filed at 01/27/2019 1540 Gross per 24 hour  Intake 3160.09 ml  Output --  Net 3160.09 ml   Filed Weights   01/26/19 1100  Weight: 71.7 kg    Examination:  Examination was performed in presence of  patient's nurse as a chaperone. General exam: Appears calm and comfortable  Breast exam: Left breast much less swollen compared to the right and improved compared to yesterday.  She still has fluctuant mass on the left lateral side of her left nipple.  It is nontender.  No erythema.  No induration. Respiratory system: Clear to auscultation. Respiratory effort normal. Cardiovascular system: S1 & S2 heard, RRR. No JVD, murmurs, rubs, gallops or clicks. No pedal edema. Gastrointestinal system: Abdomen is nondistended, soft and nontender. No organomegaly or masses felt. Normal bowel sounds heard. Central nervous system: Alert and oriented. No focal neurological deficits. Extremities: Symmetric 5 x 5 power. Skin: No rashes, lesions or ulcers Psychiatry: Judgement and insight appear normal. Mood & affect appropriate.    Data Reviewed: I have personally reviewed following labs and imaging studies  CBC: Recent Labs  Lab 01/26/19 0621 01/27/19 0305 01/28/19 1111  WBC 23.4* 20.2* 24.5*  NEUTROABS 18.5*  --  19.8*  HGB 10.0* 10.4* 9.6*  HCT 30.2* 29.7* 27.4*  MCV 80.7 77.5* 76.3*  PLT 269 313 939   Basic Metabolic Panel: Recent Labs  Lab 01/26/19 0621 01/26/19 1014 01/27/19 0305 01/28/19 1111  NA 139  --  139 141  K 2.8*  --  3.9 3.9  CL 107  --  112* 113*  CO2 20*  --  20* 20*  GLUCOSE 97  --  135* 145*  BUN 6  --  8 15  CREATININE 1.19*  --  0.90 0.93  CALCIUM 7.4*  --  7.6* 8.2*  MG  --  1.6* 2.2  --    GFR: Estimated Creatinine Clearance: 86.7 mL/min (by C-G formula based on SCr of 0.93 mg/dL). Liver Function Tests: Recent Labs  Lab 01/26/19 0621 01/28/19 1111  AST 17 31  ALT 19 19  ALKPHOS 98 66  BILITOT 1.0 0.4  PROT 5.5* 5.1*  ALBUMIN 2.0* 2.0*   No results for input(s): LIPASE, AMYLASE in the last 168 hours. No results for input(s): AMMONIA in the last 168 hours. Coagulation Profile: No results for input(s): INR, PROTIME in the last 168 hours. Cardiac  Enzymes: No results for input(s): CKTOTAL, CKMB, CKMBINDEX, TROPONINI in the last 168 hours. BNP (last 3 results) No results for input(s): PROBNP in the last 8760 hours. HbA1C: No results for input(s): HGBA1C in the last 72 hours. CBG: Recent Labs  Lab 01/27/19 0339  GLUCAP 121*   Lipid Profile: Recent Labs    01/26/19 0621  TRIG 93   Thyroid Function Tests: No results for input(s): TSH, T4TOTAL, FREET4, T3FREE, THYROIDAB in the last 72 hours. Anemia Panel: Recent Labs    01/26/19 0621 01/27/19 1017  FERRITIN 206 221   Sepsis Labs: Recent Labs  Lab 01/26/19 0621 01/26/19 0840 01/27/19 1017  PROCALCITON 0.22  --  <0.10  LATICACIDVEN 0.7 0.8  --     Recent Results (from the past 240 hour(s))  SARS Coronavirus 2 (CEPHEID- Performed in [place]  hospital lab), Hosp Order     Status: None   Collection Time: 01/26/19  5:50 AM   Specimen: Nasopharyngeal Swab  Result Value Ref Range Status   SARS Coronavirus 2 NEGATIVE NEGATIVE Final    Comment: (NOTE) If result is NEGATIVE SARS-CoV-2 target nucleic acids are NOT DETECTED. The SARS-CoV-2 RNA is generally detectable in upper and lower  respiratory specimens during the acute phase of infection. The lowest  concentration of SARS-CoV-2 viral copies this assay can detect is 250  copies / mL. A negative result does not preclude SARS-CoV-2 infection  and should not be used as the sole basis for treatment or other  patient management decisions.  A negative result may occur with  improper specimen collection / handling, submission of specimen other  than nasopharyngeal swab, presence of viral mutation(s) within the  areas targeted by this assay, and inadequate number of viral copies  (<250 copies / mL). A negative result must be combined with clinical  observations, patient history, and epidemiological information. If result is POSITIVE SARS-CoV-2 target nucleic acids are DETECTED. The SARS-CoV-2 RNA is generally  detectable in upper and lower  respiratory specimens dur ing the acute phase of infection.  Positive  results are indicative of active infection with SARS-CoV-2.  Clinical  correlation with patient history and other diagnostic information is  necessary to determine patient infection status.  Positive results do  not rule out bacterial infection or co-infection with other viruses. If result is PRESUMPTIVE POSTIVE SARS-CoV-2 nucleic acids MAY BE PRESENT.   A presumptive positive result was obtained on the submitted specimen  and confirmed on repeat testing.  While 2019 novel coronavirus  (SARS-CoV-2) nucleic acids may be present in the submitted sample  additional confirmatory testing may be necessary for epidemiological  and / or clinical management purposes  to differentiate between  SARS-CoV-2 and other Sarbecovirus currently known to infect humans.  If clinically indicated additional testing with an alternate test  methodology [phone]) is advised. The SARS-CoV-2 RNA is generally  detectable in upper and lower respiratory sp ecimens during the acute  phase of infection. The expected result is Negative. Fact Sheet for Patients:  StrictlyIdeas.no Fact Sheet for Healthcare Providers: BankingDealers.co.za This test is not yet approved or cleared by the [place] FDA and has been authorized for detection and/or diagnosis of SARS-CoV-2 by FDA under an Emergency Use Authorization (EUA).  This EUA will remain in effect (meaning this test can be used) for the duration of the COVID-19 declaration under Section 564(b)(1) of the Act, 21 U.S.C. section 360bbb-3(b)(1), unless the authorization is terminated or revoked sooner. Performed at [facility], [place] [address], [place], [place] [address]   Blood Culture (routine x 2)     Status: None (Preliminary result)   Collection Time: 01/26/19  6:20 AM   Specimen: BLOOD  Result Value Ref  Range Status   Specimen Description BLOOD RIGHT ANTECUBITAL  Final   Special Requests   Final    BOTTLES DRAWN AEROBIC AND ANAEROBIC Blood Culture adequate volume   Culture   Final    NO GROWTH 1 DAY Performed at [facility], [place] [address], [place], [place] [address]    Report Status PENDING  Incomplete  Blood Culture (routine x 2)     Status: None (Preliminary result)   Collection Time: 01/26/19  6:40 AM   Specimen: BLOOD RIGHT HAND  Result Value Ref Range Status   Specimen Description BLOOD RIGHT HAND  Final  Special Requests   Final    BOTTLES DRAWN AEROBIC AND ANAEROBIC Blood Culture adequate volume   Culture   Final    NO GROWTH 1 DAY Performed at [facility], [place] [address] [place], [place] [address]    Report Status PENDING  Incomplete  Respiratory Panel by PCR     Status: None   Collection Time: 01/26/19  6:58 PM   Specimen: Nasopharyngeal Swab; Respiratory  Result Value Ref Range Status   Adenovirus NOT DETECTED NOT DETECTED Final   Coronavirus 229E NOT DETECTED NOT DETECTED Final    Comment: (NOTE) The Coronavirus on the Respiratory Panel, DOES NOT test for the novel  Coronavirus (2019 nCoV)    Coronavirus HKU1 NOT DETECTED NOT DETECTED Final   Coronavirus NL63 NOT DETECTED NOT DETECTED Final   Coronavirus OC43 NOT DETECTED NOT DETECTED Final   Metapneumovirus NOT DETECTED NOT DETECTED Final   Rhinovirus / Enterovirus NOT DETECTED NOT DETECTED Final   Influenza A NOT DETECTED NOT DETECTED Final   Influenza B NOT DETECTED NOT DETECTED Final   Parainfluenza Virus 1 NOT DETECTED NOT DETECTED Final   Parainfluenza Virus 2 NOT DETECTED NOT DETECTED Final   Parainfluenza Virus 3 NOT DETECTED NOT DETECTED Final   Parainfluenza Virus 4 NOT DETECTED NOT DETECTED Final   Respiratory Syncytial Virus NOT DETECTED NOT DETECTED Final   Bordetella pertussis NOT DETECTED NOT DETECTED Final   Chlamydophila pneumoniae NOT DETECTED NOT DETECTED Final   Mycoplasma  pneumoniae NOT DETECTED NOT DETECTED Final    Comment: Performed at [facility], [place]. [address], [place], [place] [address]      Radiology Studies: Nm Pulmonary Perfusion  Result Date: 01/26/2019 CLINICAL DATA:  Respiratory distress and elevated D-dimer EXAM: NUCLEAR MEDICINE PERFUSION LUNG SCAN VIEWS: Anterior, posterior, left lateral, right lateral, RPO, LPO, RAO, LAO RADIOPHARMACEUTICALS:  1.5 mCi Tc-13mMAA IV COMPARISON:  Chest radiograph January 26, 2019 FINDINGS: Radiotracer uptake is homogeneous and symmetric bilaterally. No perfusion defects are demonstrable. IMPRESSION: No appreciable perfusion defects. Very low probability of pulmonary embolus. Electronically Signed   By: WLowella GripIII M.D.   On: 01/26/2019 15:33    Scheduled Meds:  albuterol  2.5 mg Nebulization BID   enoxaparin (LOVENOX) injection  40 mg Subcutaneous Q24H   guaiFENesin  600 mg Oral BID   pantoprazole (PROTONIX) IV  40 mg Intravenous Q12H   sodium chloride flush  10-40 mL Intracatheter Q12H   sodium chloride flush  3 mL Intravenous Q12H   Continuous Infusions:  sodium chloride 75 mL/hr at 01/27/19 0645   azithromycin 500 mg (01/27/19 1413)   cefTRIAXone (ROCEPHIN)  IV 1 g (01/27/19 1157)   vancomycin 1,500 mg (01/27/19 2007)     LOS: 2 days   Assessment & Plan:   Principal Problem:   Mastitis Active Problems:   Acute on chronic respiratory failure with hypoxia (HCC)   HCAP (healthcare-associated pneumonia)   Transient hypotension   AKI (acute kidney injury) ([place]   Hypokalemia   Normocytic anemia   Acute hypoxic respiratory failure and sepsis secondary to multifocal/bilateral pneumonia: She met sepsis criteria based on leukocytosis in addition to tachycardia.  Patient is currently requiring only 3 L of oxygen and chest pain with deep inspiration.  Leukocytosis slightly worse today but she remains afebrile.  She remains on Rocephin, azithromycin and vancomycin per ID  recommendations.  COVID negative.  Due to feeling of worsening shortness of breath, we will proceed with CT angiogram of  the chest to rule out other etiologies, follow-up for pneumonia and rule out PE.   Sepsis secondary to left breast mastitis: The above-mentioned antibiotics.  She was evaluated by surgery and no further recommendations for intervention were done.  She is yet to be seen by ID.   DVT prophylaxis: Lovenox Code Status: Full code Family Communication: None present at bedside.  Patient verbalized understanding of our conversation. Disposition Plan: To be determined however I expect potential discharge in next 24- 48 hours.   Time spent: 29 minutes   Darliss Cheney, MD [facility] Pager [phone]  If 7PM-7AM, please contact night-coverage [email or web address] Password [facility] 01/28/2019, 12:27 PM

## 2019-01-28 NOTE — Progress Notes (Signed)
Patient ID: Erica Williamson, female   DOB: 09/26/1990, 28 y.o.   MRN: [id number]       Subjective: Patient feels much better in regards to her breast.  Only complaint is still of SOB.  Objective: Vital signs in last 24 hours: Temp:  [97.2 F (36.2 C)-98.7 F (37.1 C)] 97.6 F (36.4 C) (07/12 2328) Pulse Rate:  [66-94] 66 (07/13 0313) Resp:  [12-21] 20 (07/13 0313) BP: (109-120)/(79-82) 109/79 (07/13 0313) SpO2:  [95 %-100 %] 98 % (07/13 0313) Last BM Date: 01/25/19  Intake/Output from previous day: 07/12 0701 - 07/13 0700 In: 3280.1 [P.O.:120; I.V.:1810.1; IV Piggyback:1350] Out: -  Intake/Output this shift: No intake/output data recorded.  PE: Chest/breast: left breast is still somewhat edematous, but nontender and no cellulitis present.  No obvious fluctuance or abscesses.  Lab Results:  Recent Labs    01/26/19 0621 01/27/19 0305  WBC 23.4* 20.2*  HGB 10.0* 10.4*  HCT 30.2* 29.7*  PLT 269 313   BMET Recent Labs    01/26/19 0621 01/27/19 0305  NA 139 139  K 2.8* 3.9  CL 107 112*  CO2 20* 20*  GLUCOSE 97 135*  BUN 6 8  CREATININE 1.19* 0.90  CALCIUM 7.4* 7.6*   PT/INR No results for input(s): LABPROT, INR in the last 72 hours. CMP     Component Value Date/Time   NA 139 01/27/2019 0305   K 3.9 01/27/2019 0305   CL 112 (H) 01/27/2019 0305   CO2 20 (L) 01/27/2019 0305   GLUCOSE 135 (H) 01/27/2019 0305   BUN 8 01/27/2019 0305   CREATININE 0.90 01/27/2019 0305   CALCIUM 7.6 (L) 01/27/2019 0305   PROT 5.5 (L) 01/26/2019 0621   ALBUMIN 2.0 (L) 01/26/2019 0621   AST 17 01/26/2019 0621   ALT 19 01/26/2019 0621   ALKPHOS 98 01/26/2019 0621   BILITOT 1.0 01/26/2019 0621   GFRNONAA >60 01/27/2019 0305   GFRAA >60 01/27/2019 0305   Lipase  No results found for: LIPASE     Studies/Results: Dg Chest 2 View  Result Date: 01/26/2019 CLINICAL DATA:  Community acquired pneumonia EXAM: CHEST - 2 VIEW COMPARISON:  01/25/2019 FINDINGS: Slight interval  increase in small bilateral pleural effusions and associated bibasilar atelectasis or consolidation. No new airspace opacity. IMPRESSION: Slight interval increase in small bilateral pleural effusions and associated bibasilar atelectasis or consolidation. No new airspace opacity. Electronically Signed   By: Lauralyn PrimesAlex  Bibbey M.D.   On: 01/26/2019 11:12   Nm Pulmonary Perfusion  Result Date: 01/26/2019 CLINICAL DATA:  Respiratory distress and elevated D-dimer EXAM: NUCLEAR MEDICINE PERFUSION LUNG SCAN VIEWS: Anterior, posterior, left lateral, right lateral, RPO, LPO, RAO, LAO RADIOPHARMACEUTICALS:  1.5 mCi Tc-1927m MAA IV COMPARISON:  Chest radiograph January 26, 2019 FINDINGS: Radiotracer uptake is homogeneous and symmetric bilaterally. No perfusion defects are demonstrable. IMPRESSION: No appreciable perfusion defects. Very low probability of pulmonary embolus. Electronically Signed   By: Bretta BangWilliam  Woodruff III M.D.   On: 01/26/2019 15:33    Anti-infectives: Anti-infectives (From admission, onward)   Start     Dose/Rate Route Frequency Ordered Stop   01/26/19 1630  vancomycin (VANCOCIN) 1,500 mg in sodium chloride 0.9 % 500 mL IVPB     1,500 mg 250 mL/hr over 120 Minutes Intravenous Every 24 hours 01/26/19 1514     01/26/19 1400  azithromycin (ZITHROMAX) 500 mg in sodium chloride 0.9 % 250 mL IVPB     500 mg 250 mL/hr over 60 Minutes Intravenous Every 24  hours 01/26/19 1220     01/26/19 1200  cefTRIAXone (ROCEPHIN) 1 g in sodium chloride 0.9 % 100 mL IVPB     1 g 200 mL/hr over 30 Minutes Intravenous Every 24 hours 01/26/19 0942     01/26/19 0945  vancomycin (VANCOCIN) IVPB 1000 mg/200 mL premix  Status:  Discontinued     1,000 mg 200 mL/hr over 60 Minutes Intravenous Every 24 hours 01/26/19 0942 01/26/19 1009       Assessment/Plan  Left breast mastitis -resolving -cont abx therapy per ID recommendations -no plans for surgical intervention -we will sign off    FEN - regular diet VTE - Lovenox  ID - Ceftriaxone stopped today, unasyn 7/13 -->, azithromycin (PNA)   LOS: 2 days    Henreitta Cea , [facility] 01/28/2019, 10:24 AM Pager: [phone]

## 2019-01-28 NOTE — Progress Notes (Addendum)
[facility]  Date of Admission:  01/26/2019      Total days of antibiotics 6        Day 3 vancomycin, azithromycin, ceftriaxone           ASSESSMENT: Erica Williamson has had some improvement with regards to her left sided mastitis on empiric antibiotics. Unfortunately her breathing is worse today. Dry cough and chest pain now reported. CRP remains elevated but PCT decreased on antibiotics. Discussed with Dr. Jacqulyn BathPahwani and with elevated DDimer will pursue CTA of the chest to assess both for consideration of PE and re-evaluate lung parenchyma. She is saturating well on 3 LPM. Her breathing problems only recently started during her hospitalization this past Friday. Repeated COVID test was sent yesterday and pending but previous testing at [facility] reported to be negative as was [facility] in-house test here 2 days ago. There is concern with aspiration pneumonitis in the setting of vomiting recently - no purulent cough.    PLAN: 1. CTA of chest as d/w Dr. Jacqulyn BathPahwani  2. Continue Ceftriaxone  3. Continue vancomycin for mastitis and reassess tomorrow 4. Check nasal MRSA PCR      Principal Problem:   Mastitis Active Problems:   Acute on chronic respiratory failure with hypoxia (HCC)   HCAP (healthcare-associated pneumonia)   Transient hypotension   AKI (acute kidney injury) (HCC)   Hypokalemia   Normocytic anemia   . albuterol  2.5 mg Nebulization BID  . enoxaparin (LOVENOX) injection  40 mg Subcutaneous Q24H  . guaiFENesin  600 mg Oral BID  . pantoprazole (PROTONIX) IV  40 mg Intravenous Q12H  . sodium chloride flush  10-40 mL Intracatheter Q12H  . sodium chloride flush  3 mL Intravenous Q12H    SUBJECTIVE: She is feeling better in all ways with exception of her breathing. She reports that her left breast is no longer tender, warm or red and much improved now after 6 days of antibiotics from when she was at [facility] and now 3 days here. Her  nausea is improved significantly and ate breakfast for the first time today in over a week. She is also not having any further fevers. She did describe what sounds to have been an abscess on the medial/inner quadrant of the breast prior to the significant swelling on the left side but unclear as to if this resolved spontaneously or if this ruptured.   Her breathing she describes to be worse. She is dyspneic during conversation now and requires sitting upright in bed. Anterior chest pain to the left of the sternum that is unrelated to left breast swelling and described more pleuritic with breaths. Unable to deeply inhale. Dry hacking cough that is non-productive.   Review of Systems: Review of Systems  All other systems reviewed and are negative. See HPI for details  No Known Allergies  OBJECTIVE: Vitals:   01/27/19 2002 01/27/19 2221 01/27/19 2328 01/28/19 0313  BP: 120/80 113/81  109/79  Pulse: 85 82  66  Resp: 17 20  20   Temp: (!) 97.2 F (36.2 C)  97.6 F (36.4 C)   TempSrc: Oral  Oral   SpO2: 99% 98%  98%  Weight:      Height:       Body mass index is 27.13 kg/m.  Physical Exam Constitutional:      Comments: Ill appearing, difficulty breathing and conversationally dyspneic. Resolved with sitting more upright and no talking.  HENT:     Mouth/Throat:     Mouth: Mucous membranes are dry.  Eyes:     General: No scleral icterus. Cardiovascular:     Rate and Rhythm: Normal rate and regular rhythm.     Heart sounds: No murmur.  Pulmonary:     Breath sounds: No wheezing or rhonchi.     Comments: She sitting upright in bed, conversationally dyspneic on 3 LPM oxygen. Oxygen saturations adequate about 97%. No noisy breath sounds but diminished L>R posterior bases.  Chest:       Comments: Diffuse swelling that spans from outer quadrant in toward nipple. No discharge on exam or reports of discharge. Nothing remarkable noted on medial inner quadrant of the breast where she  previously reported a "lump." Abdominal:     General: Bowel sounds are normal.     Tenderness: There is no abdominal tenderness.  Skin:    General: Skin is warm and dry.     Capillary Refill: Capillary refill takes less than 2 seconds.  Neurological:     Mental Status: She is alert and oriented to person, place, and time.     Lab Results Lab Results  Component Value Date   WBC 20.2 (H) 01/27/2019   HGB 10.4 (L) 01/27/2019   HCT 29.7 (L) 01/27/2019   MCV 77.5 (L) 01/27/2019   PLT 313 01/27/2019    Lab Results  Component Value Date   CREATININE 0.90 01/27/2019   BUN 8 01/27/2019   NA 139 01/27/2019   K 3.9 01/27/2019   CL 112 (H) 01/27/2019   CO2 20 (L) 01/27/2019    Lab Results  Component Value Date   ALT 19 01/26/2019   AST 17 01/26/2019   ALKPHOS 98 01/26/2019   BILITOT 1.0 01/26/2019     Microbiology: Recent Results (from the past 240 hour(s))  SARS Coronavirus 2 (CEPHEID- Performed in [facility] hospital lab), Hosp Order     Status: None   Collection Time: 01/26/19  5:50 AM   Specimen: Nasopharyngeal Swab  Result Value Ref Range Status   SARS Coronavirus 2 NEGATIVE NEGATIVE Final    Comment: (NOTE) If result is NEGATIVE SARS-CoV-2 target nucleic acids are NOT DETECTED. The SARS-CoV-2 RNA is generally detectable in upper and lower  respiratory specimens during the acute phase of infection. The lowest  concentration of SARS-CoV-2 viral copies this assay can detect is 250  copies / mL. A negative result does not preclude SARS-CoV-2 infection  and should not be used as the sole basis for treatment or other  patient management decisions.  A negative result may occur with  improper specimen collection / handling, submission of specimen other  than nasopharyngeal swab, presence of viral mutation(s) within the  areas targeted by this assay, and inadequate number of viral copies  (<250 copies / mL). A negative result must be combined with clinical  observations,  patient history, and epidemiological information. If result is POSITIVE SARS-CoV-2 target nucleic acids are DETECTED. The SARS-CoV-2 RNA is generally detectable in upper and lower  respiratory specimens dur ing the acute phase of infection.  Positive  results are indicative of active infection with SARS-CoV-2.  Clinical  correlation with patient history and other diagnostic information is  necessary to determine patient infection status.  Positive results do  not rule out bacterial infection or co-infection with other viruses. If result is PRESUMPTIVE POSTIVE SARS-CoV-2 nucleic acids MAY BE PRESENT.   A presumptive positive result was obtained on the submitted specimen  and confirmed on repeat testing.  While 2019 novel coronavirus  (SARS-CoV-2) nucleic acids may be present in the submitted sample  additional confirmatory testing may be necessary for epidemiological  and / or clinical management purposes  to differentiate between  SARS-CoV-2 and other Sarbecovirus currently known to infect humans.  If clinically indicated additional testing with an alternate test  methodology [phone]([id number]) is advised. The SARS-CoV-2 RNA is generally  detectable in upper and lower respiratory sp ecimens during the acute  phase of infection. The expected result is Negative. Fact Sheet for Patients:  BoilerBrush.com.cyhttps://www.fda.gov/media/136312/download Fact Sheet for Healthcare Providers: https://pope.com/https://www.fda.gov/media/136313/download This test is not yet approved or cleared by the [place] FDA and has been authorized for detection and/or diagnosis of SARS-CoV-2 by FDA under an Emergency Use Authorization (EUA).  This EUA will remain in effect (meaning this test can be used) for the duration of the COVID-19 declaration under Section 564(b)(1) of the Act, 21 U.S.C. section 360bbb-3(b)(1), unless the authorization is terminated or revoked sooner. Performed at [facility], [address] [place], [place]  [address]   Blood Culture (routine x 2)     Status: None (Preliminary result)   Collection Time: 01/26/19  6:20 AM   Specimen: BLOOD  Result Value Ref Range Status   Specimen Description BLOOD RIGHT ANTECUBITAL  Final   Special Requests   Final    BOTTLES DRAWN AEROBIC AND ANAEROBIC Blood Culture adequate volume   Culture   Final    NO GROWTH 1 DAY Performed at [facility], [address] [place], [place] [address]    Report Status PENDING  Incomplete  Blood Culture (routine x 2)     Status: None (Preliminary result)   Collection Time: 01/26/19  6:40 AM   Specimen: BLOOD RIGHT HAND  Result Value Ref Range Status   Specimen Description BLOOD RIGHT HAND  Final   Special Requests   Final    BOTTLES DRAWN AEROBIC AND ANAEROBIC Blood Culture adequate volume   Culture   Final    NO GROWTH 1 DAY Performed at [facility], [address] [place], [place] [address]    Report Status PENDING  Incomplete  Respiratory Panel by PCR     Status: None   Collection Time: 01/26/19  6:58 PM   Specimen: Nasopharyngeal Swab; Respiratory  Result Value Ref Range Status   Adenovirus NOT DETECTED NOT DETECTED Final   Coronavirus 229E NOT DETECTED NOT DETECTED Final    Comment: (NOTE) The Coronavirus on the Respiratory Panel, DOES NOT test for the novel  Coronavirus (2019 nCoV)    Coronavirus HKU1 NOT DETECTED NOT DETECTED Final   Coronavirus NL63 NOT DETECTED NOT DETECTED Final   Coronavirus OC43 NOT DETECTED NOT DETECTED Final   Metapneumovirus NOT DETECTED NOT DETECTED Final   Rhinovirus / Enterovirus NOT DETECTED NOT DETECTED Final   Influenza A NOT DETECTED NOT DETECTED Final   Influenza B NOT DETECTED NOT DETECTED Final   Parainfluenza Virus 1 NOT DETECTED NOT DETECTED Final   Parainfluenza Virus 2 NOT DETECTED NOT DETECTED Final   Parainfluenza Virus 3 NOT DETECTED NOT DETECTED Final   Parainfluenza Virus 4 NOT DETECTED NOT DETECTED Final   Respiratory Syncytial Virus NOT  DETECTED NOT DETECTED Final   Bordetella pertussis NOT DETECTED NOT DETECTED Final   Chlamydophila pneumoniae NOT DETECTED NOT DETECTED Final   Mycoplasma pneumoniae NOT DETECTED NOT DETECTED Final    Comment: Performed at [facility], [address] [place], [place] [address]     Janene Madeira, MSN, NP-C [facility] [email or web address] Pager: [phone] Office: [place]: [phone]   01/28/2019  9:36 AM

## 2019-01-29 ENCOUNTER — Other Ambulatory Visit: Payer: Self-pay

## 2019-01-29 LAB — IRON AND TIBC
Iron: 12 ug/dL — ABNORMAL LOW (ref 28–170)
Saturation Ratios: 6 % — ABNORMAL LOW (ref 10.4–31.8)
TIBC: 195 ug/dL — ABNORMAL LOW (ref 250–450)
UIBC: 183 ug/dL

## 2019-01-29 LAB — COMPREHENSIVE METABOLIC PANEL
ALT: 31 U/L (ref 0–44)
AST: 31 U/L (ref 15–41)
Albumin: 1.7 g/dL — ABNORMAL LOW (ref 3.5–5.0)
Alkaline Phosphatase: 53 U/L (ref 38–126)
Anion gap: 7 (ref 5–15)
BUN: 10 mg/dL (ref 6–20)
CO2: 22 mmol/L (ref 22–32)
Calcium: 7.2 mg/dL — ABNORMAL LOW (ref 8.9–10.3)
Chloride: 110 mmol/L (ref 98–111)
Creatinine, Ser: 0.8 mg/dL (ref 0.44–1.00)
GFR calc Af Amer: >60 mL/min (ref >60)
GFR calc non Af Amer: >60 mL/min (ref >60)
Glucose, Bld: 95 mg/dL (ref 70–99)
Potassium: 2.8 mmol/L — ABNORMAL LOW (ref 3.5–5.1)
Sodium: 139 mmol/L (ref 135–145)
Total Bilirubin: 0.5 mg/dL (ref 0.3–1.2)
Total Protein: 4.4 g/dL — ABNORMAL LOW (ref 6.5–8.1)

## 2019-01-29 LAB — CBC WITH DIFFERENTIAL/PLATELET
Abs Immature Granulocytes: 0.76 10*3/uL — ABNORMAL HIGH (ref 0.00–0.07)
Basophils Absolute: 0 10*3/uL (ref 0.0–0.1)
Basophils Relative: 0 %
Eosinophils Absolute: 0 10*3/uL (ref 0.0–0.5)
Eosinophils Relative: 0 %
HCT: 25.3 % — ABNORMAL LOW (ref 36.0–46.0)
Hemoglobin: 8.6 g/dL — ABNORMAL LOW (ref 12.0–15.0)
Immature Granulocytes: 4 %
Lymphocytes Relative: 18 %
Lymphs Abs: 3.3 10*3/uL (ref 0.7–4.0)
MCH: 26.5 pg (ref 26.0–34.0)
MCHC: 34 g/dL (ref 30.0–36.0)
MCV: 78.1 fL — ABNORMAL LOW (ref 80.0–100.0)
Monocytes Absolute: 1.6 10*3/uL — ABNORMAL HIGH (ref 0.1–1.0)
Monocytes Relative: 9 %
Neutro Abs: 12.1 10*3/uL — ABNORMAL HIGH (ref 1.7–7.7)
Neutrophils Relative %: 69 %
Platelets: 322 10*3/uL (ref 150–400)
RBC: 3.24 MIL/uL — ABNORMAL LOW (ref 3.87–5.11)
RDW: 14.9 % (ref 11.5–15.5)
WBC: 17.8 10*3/uL — ABNORMAL HIGH (ref 4.0–10.5)
nRBC: 0.2 % (ref 0.0–0.2)

## 2019-01-29 LAB — RETICULOCYTES
Immature Retic Fract: 22.8 % — ABNORMAL HIGH (ref 2.3–15.9)
RBC.: 3.43 MIL/uL — ABNORMAL LOW (ref 3.87–5.11)
Retic Count, Absolute: 52.5 10*3/uL (ref 19.0–186.0)
Retic Ct Pct: 1.5 % (ref 0.4–3.1)

## 2019-01-29 LAB — TRANSFERRIN: Transferrin: 139 mg/dL — ABNORMAL LOW (ref 192–382)

## 2019-01-29 LAB — VITAMIN B12: Vitamin B-12: 1091 pg/mL — ABNORMAL HIGH (ref 180–914)

## 2019-01-29 MED ORDER — POTASSIUM CHLORIDE CRYS ER 20 MEQ PO TBCR
40.0000 meq | EXTENDED_RELEASE_TABLET | Freq: Four times a day (QID) | ORAL | Status: AC
  Administered 2019-01-29 (×2): 40 meq via ORAL
  Filled 2019-01-29 (×2): qty 2

## 2019-01-29 MED ORDER — ORAL CARE MOUTH RINSE: 15.0000 mL | Freq: Two times a day (BID) | OROMUCOSAL | Status: DC

## 2019-01-29 NOTE — Progress Notes (Signed)
Spoke with pt's husband Ghulam with updates on POC. He asked if MD could give him a call as well. MD notified.

## 2019-01-29 NOTE — Plan of Care (Signed)
  Problem: Education: Goal: Knowledge of General Education information will improve Description Including pain rating scale, medication(s)/side effects and non-pharmacologic comfort measures Outcome: Progressing   Problem: Health Behavior/Discharge Planning: Goal: Ability to manage health-related needs will improve Outcome: Progressing   Problem: Clinical Measurements: Goal: Ability to maintain clinical measurements within normal limits will improve Outcome: Progressing Goal: Will remain free from infection Outcome: Progressing Goal: Diagnostic test results will improve Outcome: Progressing Goal: Respiratory complications will improve Outcome: Progressing Goal: Cardiovascular complication will be avoided Outcome: Progressing   Problem: Activity: Goal: Risk for activity intolerance will decrease Outcome: Progressing   Problem: Coping: Goal: Level of anxiety will decrease Outcome: Progressing   Problem: Pain Managment: Goal: General experience of comfort will improve Outcome: Progressing   Problem: Safety: Goal: Ability to remain free from injury will improve Outcome: Progressing   

## 2019-01-29 NOTE — Progress Notes (Signed)
[facility]  Date of Admission:  01/26/2019      Total days of antibiotics 7      Day 4 vancomycin, azithromycin, ceftriaxone           ASSESSMENT: Erica Williamson has multifocal pneumonia with hypoxia that developed after treatment for left breast mastitis. COVID-19 negative x 2 with 3rd test in process. RVP (-). HIV (-). PCT < 10 now. CTA (-) PE but with bilateral pulmonary effusions and multifocal pneumonia. Aspiration pneumonia/pneumonitis vs CAP. No viral pathogens identified at this time. She is unable to produce sputum to provide sample.   Today Erica Williamson looks improved compared to yesterday's exam clinically although she continues to have oxygen requirement and had fever to 101 F last night. I turned her oxygen down to 2 LPM and encouraged her to continue to walk around, wean down her oxygen and eat as she can tolerate. On exam she is clear in upper/anterior lung fields and diminished in posterior bases related to effusions. Serum protein low - encouraged her to try to eat small frequent samplings as she can tolerate.   She has no other new complaints or concerns aside from when she can leave (she does not feel strong enough or ready yet). I am worried the azithromycin is contributing to her ongoing nausea/vomiting - she has received 3 doses of 500 mg and would consider stopping this today. Her nasal PCR for MRSA is negative. Again no records have arrived from [facility] despite multiple attempts to contact.   Regarding her mastitis - this is improved but still with swelling. Today is day 4 of vancomycin therapy. Will stop and follow on ceftriaxone alone for now. MRSA pcr is negative.    PLAN: 1. Stop azithromycin and vancomycin  2. Continue ceftriaxone until she can tolerate PO's and on less oxygen 3. Wean down oxygen as able 4. Encourage ambulation and pulmonary hygiene with incentive spirometer / flutter valve. 5. ? lasix with effusions b/l.       Principal Problem:   Mastitis Active Problems:   Acute on chronic respiratory failure with hypoxia (HCC)   HCAP (healthcare-associated pneumonia)   Transient hypotension   AKI (acute kidney injury) (HCC)   Hypokalemia   Normocytic anemia   CAP (community acquired pneumonia)   . albuterol  2.5 mg Nebulization BID  . enoxaparin (LOVENOX) injection  40 mg Subcutaneous Q24H  . guaiFENesin  600 mg Oral BID  . pantoprazole (PROTONIX) IV  40 mg Intravenous Q12H  . potassium chloride  40 mEq Oral Q6H  . sodium chloride flush  10-40 mL Intracatheter Q12H  . sodium chloride flush  3 mL Intravenous Q12H    SUBJECTIVE: She is breathing better this morning.  She did have an episode of fever last night to just above 101 degrees.  She recalls having an episode of vomiting around this timeframe as well.  She is continues to struggle with episodes of vomiting and intolerance of food. She did walk around in the hallway with noticeably less effort with regards to her breathing. She is concerned with how significantly ill she got during this period of time and uncertain as to the cause.  No exposures prior to admission to any new animals/birds, same home environment she has always lived in, no new water sources.   The swelling in her left breast is still present but all other symptoms have improved.  MRSA PCR negative. HIV neg on  admission.   Review of Systems: Review of Systems  All other systems reviewed and are negative. See HPI for details  No Known Allergies  OBJECTIVE: Vitals:   01/28/19 2324 01/29/19 0004 01/29/19 0430 01/29/19 0537  BP:  112/66 102/74   Pulse: (!) 121 100 81   Resp: (!) 23 (!) 28 16   Temp:  (!) 97.5 F (36.4 C) 98.2 F (36.8 C)   TempSrc:  Oral Oral   SpO2: 94% 95% 98%   Weight:    78.9 kg  Height:       Body mass index is 29.86 kg/m.  Physical Exam Constitutional:      Comments: Appears to be breathing more comfortably compared to yesterday.  Sitting upright in bed, smiling and able to converse easily.   HENT:     Mouth/Throat:     Mouth: Mucous membranes are dry.  Cardiovascular:     Rate and Rhythm: Normal rate and regular rhythm.     Heart sounds: No murmur.  Pulmonary:     Comments: Saturating 99-100% on 3L [place]. Able to converse easily without rest periods. She is still very decreased in bilateral lung bases. Otherwise no respiratory noise in anterior/upper airways.  Chest:       Comments: Diffuse swelling that spans from outer quadrant in toward nipple. Firmness felt tracking along to nipple that is unchanged. No discharge on exam or report. No changes from yesterday.  Abdominal:     General: Bowel sounds are normal.     Tenderness: There is no abdominal tenderness.  Skin:    General: Skin is warm and dry.     Coloration: Skin is pale.  Neurological:     Mental Status: She is alert and oriented to person, place, and time.     Lab Results Lab Results  Component Value Date   WBC 17.8 (H) 01/29/2019   HGB 8.6 (L) 01/29/2019   HCT 25.3 (L) 01/29/2019   MCV 78.1 (L) 01/29/2019   PLT 322 01/29/2019    Lab Results  Component Value Date   CREATININE 0.80 01/29/2019   BUN 10 01/29/2019   NA 139 01/29/2019   K 2.8 (L) 01/29/2019   CL 110 01/29/2019   CO2 22 01/29/2019    Lab Results  Component Value Date   ALT 31 01/29/2019   AST 31 01/29/2019   ALKPHOS 53 01/29/2019   BILITOT 0.5 01/29/2019     Microbiology: Recent Results (from the past 240 hour(s))  SARS Coronavirus 2 (CEPHEID- Performed in [place] hospital lab), Hosp Order     Status: None   Collection Time: 01/26/19  5:50 AM   Specimen: Nasopharyngeal Swab  Result Value Ref Range Status   SARS Coronavirus 2 NEGATIVE NEGATIVE Final    Comment: (NOTE) If result is NEGATIVE SARS-CoV-2 target nucleic acids are NOT DETECTED. The SARS-CoV-2 RNA is generally detectable in upper and lower  respiratory specimens during the acute phase of infection.  The lowest  concentration of SARS-CoV-2 viral copies this assay can detect is 250  copies / mL. A negative result does not preclude SARS-CoV-2 infection  and should not be used as the sole basis for treatment or other  patient management decisions.  A negative result may occur with  improper specimen collection / handling, submission of specimen other  than nasopharyngeal swab, presence of viral mutation(s) within the  areas targeted by this assay, and inadequate number of viral copies  (<250 copies / mL). A negative result  must be combined with clinical  observations, patient history, and epidemiological information. If result is POSITIVE SARS-CoV-2 target nucleic acids are DETECTED. The SARS-CoV-2 RNA is generally detectable in upper and lower  respiratory specimens dur ing the acute phase of infection.  Positive  results are indicative of active infection with SARS-CoV-2.  Clinical  correlation with patient history and other diagnostic information is  necessary to determine patient infection status.  Positive results do  not rule out bacterial infection or co-infection with other viruses. If result is PRESUMPTIVE POSTIVE SARS-CoV-2 nucleic acids MAY BE PRESENT.   A presumptive positive result was obtained on the submitted specimen  and confirmed on repeat testing.  While 2019 novel coronavirus  (SARS-CoV-2) nucleic acids may be present in the submitted sample  additional confirmatory testing may be necessary for epidemiological  and / or clinical management purposes  to differentiate between  SARS-CoV-2 and other Sarbecovirus currently known to infect humans.  If clinically indicated additional testing with an alternate test  methodology [phone]([id number]) is advised. The SARS-CoV-2 RNA is generally  detectable in upper and lower respiratory sp ecimens during the acute  phase of infection. The expected result is Negative. Fact Sheet for Patients:  BoilerBrush.com.cyhttps://www.fda.gov/media/136312/download  Fact Sheet for Healthcare Providers: https://pope.com/https://www.fda.gov/media/136313/download This test is not yet approved or cleared by the [place] FDA and has been authorized for detection and/or diagnosis of SARS-CoV-2 by FDA under an Emergency Use Authorization (EUA).  This EUA will remain in effect (meaning this test can be used) for the duration of the COVID-19 declaration under Section 564(b)(1) of the Act, 21 U.S.C. section 360bbb-3(b)(1), unless the authorization is terminated or revoked sooner. Performed at [facility], [address] [place], [place] [address]   Blood Culture (routine x 2)     Status: None (Preliminary result)   Collection Time: 01/26/19  6:20 AM   Specimen: BLOOD  Result Value Ref Range Status   Specimen Description BLOOD RIGHT ANTECUBITAL  Final   Special Requests   Final    BOTTLES DRAWN AEROBIC AND ANAEROBIC Blood Culture adequate volume   Culture   Final    NO GROWTH 2 DAYS Performed at [facility], [address] [place], [place] [address]    Report Status PENDING  Incomplete  Blood Culture (routine x 2)     Status: None (Preliminary result)   Collection Time: 01/26/19  6:40 AM   Specimen: BLOOD RIGHT HAND  Result Value Ref Range Status   Specimen Description BLOOD RIGHT HAND  Final   Special Requests   Final    BOTTLES DRAWN AEROBIC AND ANAEROBIC Blood Culture adequate volume   Culture   Final    NO GROWTH 2 DAYS Performed at [facility], [address] [place], [place] [address]    Report Status PENDING  Incomplete  Respiratory Panel by PCR     Status: None   Collection Time: 01/26/19  6:58 PM   Specimen: Nasopharyngeal Swab; Respiratory  Result Value Ref Range Status   Adenovirus NOT DETECTED NOT DETECTED Final   Coronavirus 229E NOT DETECTED NOT DETECTED Final    Comment: (NOTE) The Coronavirus on the Respiratory Panel, DOES NOT test for the novel  Coronavirus (2019 nCoV)    Coronavirus HKU1 NOT DETECTED NOT DETECTED  Final   Coronavirus NL63 NOT DETECTED NOT DETECTED Final   Coronavirus OC43 NOT DETECTED NOT DETECTED Final   Metapneumovirus NOT DETECTED NOT DETECTED Final   Rhinovirus / Enterovirus  NOT DETECTED NOT DETECTED Final   Influenza A NOT DETECTED NOT DETECTED Final   Influenza B NOT DETECTED NOT DETECTED Final   Parainfluenza Virus 1 NOT DETECTED NOT DETECTED Final   Parainfluenza Virus 2 NOT DETECTED NOT DETECTED Final   Parainfluenza Virus 3 NOT DETECTED NOT DETECTED Final   Parainfluenza Virus 4 NOT DETECTED NOT DETECTED Final   Respiratory Syncytial Virus NOT DETECTED NOT DETECTED Final   Bordetella pertussis NOT DETECTED NOT DETECTED Final   Chlamydophila pneumoniae NOT DETECTED NOT DETECTED Final   Mycoplasma pneumoniae NOT DETECTED NOT DETECTED Final    Comment: Performed at [facility], [address] [place], [place] [address]  MRSA PCR Screening     Status: None   Collection Time: 01/28/19 10:20 AM   Specimen: Nasal Mucosa; Nasopharyngeal  Result Value Ref Range Status   MRSA by PCR NEGATIVE NEGATIVE Final    Comment:        The GeneXpert MRSA Assay (FDA approved for NASAL specimens only), is one component of a comprehensive MRSA colonization surveillance program. It is not intended to diagnose MRSA infection nor to guide or monitor treatment for MRSA infections. Performed at [facility], [address] [place], [place] [address]      Rexene AlbertsStephanie Kyree Adriano, MSN, NP-C [facility]  [email or web address] Pager: [phone] Office: [phone] [facility] Main Line: [phone]   01/29/2019  9:38 AM

## 2019-01-29 NOTE — Progress Notes (Addendum)
PROGRESS NOTE    Erica Williamson  BVQ:[id number] DOB: 1991-07-15 DOA: 01/26/2019 PCP: Patient, No Pcp Per   Brief Narrative:  Erica Williamson is a 28 y.o. female without significant past medical history; who presented breast discomfort and enlargement.  Patient reported that she initially noted a lump on her left breast on June 24.  She followed up with Dr. Adah Perl of OB/GYN 6 days later, and was started started on antibiotics.  Treated with doxycycline and subsequently Augmentin without any improvement in symptoms.  Associated symptoms included fevers and chills. Patient had been sent for ultrasound of the breast on 7/8, which revealed worsening infection and with no signs of an abscess.  She was advised to come to the emergency room at [facility] on 7/8, where she was admitted into the hospital at that time.  Blood cultures were obtained along with placing the patient on empiric antibiotics of vancomycin and Rocephin for treatment of sepsis secondary to mastitis of the left breast.  Vital signs were noted to be stable at that time.  Admission lab work revealed WBC 20.4, hemoglobin 13.5, platelets 336, creatinine 0.46, and lactic acid 0.8.  Despite antibiotics patient reportedly still continued to spike fever up 103 F and showed worsening white blood cell count.  Patient reportedly became more short of breath on around 17 2020. Labs on 7/10 revealed WBC 22, hemoglobin 10.2, creatinine 1.5, troponin negative, and d-dimer 1.98.  CT scan of the chest with contrast was obtained showing signs of a multifocal pneumonia and left mastitis without signs of fluid collection.   Patient was noted to be hypotensive and temporarily placed on dopamine and was on nasal cannula oxygen.  Patient was transferred prior to COVID-19 testing as there was a 24-hour turnaround time.  Transfer was requested and patient had initially been transferred to ED here at [facility].  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  She really has not had a cough but has felt short of breath to the point which makes her cough.  She complained of heaviness in her chest and pain with taking deep inspiratory breaths.  Upon further questioning, she tells me that her father-in-law who was suffering from lung cancer died of pneumonia just 3 weeks ago and the lived in the same house.  That is the only contact she has.  There is no other family member sick with similar pneumonia or any respiratory symptoms.  When she arrived here at emergency department, she was hypoxic requiring 4 L of nasal cannula with white cells of 23,000.  Also hypokalemia with potassium of 2.8 and acute kidney kidney with creatinine of 1.19.  She was tested negative for COVID and was admitted under hospitalist service and was placed on IV Rocephin, Zithromax and vancomycin as well as Solu-Medrol.  General surgery and infectious disease were consulted.  General surgery recommended no surgical intervention.  Due to worsening shortness of breath however improving oxygenation, CT angiogram of the chest was done and she was ruled out of PE but this once again showed bilateral infiltrates.  Patient has now developed low-grade fever yesterday.  Consultants:   General surgery  Infectious disease  Procedures:   None  Antimicrobials:   IV Rocephin, Zithromax and vancomycin since 01/26/2019   Subjective: Patient seen and examined.  She states that she overall feels better than yesterday.  Left breast pain is improved.  Breathing is improved as well.  She is currently on 2 L of oxygen  and she is saturating 99%.  Objective: Vitals:   01/28/19 2324 01/29/19 0004 01/29/19 0430 01/29/19 0537  BP:  112/66 102/74   Pulse: (!) 121 100 81   Resp: (!) 23 (!) 28 16   Temp:  (!) 97.5 F (36.4 C) 98.2 F (36.8 C)   TempSrc:  Oral Oral   SpO2: 94% 95% 98%   Weight:    78.9 kg  Height:        Intake/Output Summary (Last 24 hours) at 01/29/2019  1027 Last data filed at 01/29/2019 0300 Gross per 24 hour  Intake 3494.77 ml  Output --  Net 3494.77 ml   Filed Weights   01/26/19 1100 01/29/19 0537  Weight: 71.7 kg 78.9 kg    Examination:   General exam: Appears calm and comfortable  Respiratory system: Diminished breath sounds with no rhonchi or crackles.Marland Kitchen Respiratory effort normal. Cardiovascular system: S1 & S2 heard, RRR. No JVD, murmurs, rubs, gallops or clicks. No pedal edema. Gastrointestinal system: Abdomen is nondistended, soft and nontender. No organomegaly or masses felt. Normal bowel sounds heard. Central nervous system: Alert and oriented. No focal neurological deficits. Extremities: Symmetric 5 x 5 power. Skin: No rashes, lesions or ulcers Psychiatry: Judgement and insight appear normal. Mood & affect appropriate.  Breast exam was deferred today (no chaperone present)  Data Reviewed: I have personally reviewed following labs and imaging studies  CBC: Recent Labs  Lab 01/26/19 0621 01/27/19 0305 01/28/19 1111 01/29/19 0414  WBC 23.4* 20.2* 24.5* 17.8*  NEUTROABS 18.5*  --  19.8* 12.1*  HGB 10.0* 10.4* 9.6* 8.6*  HCT 30.2* 29.7* 27.4* 25.3*  MCV 80.7 77.5* 76.3* 78.1*  PLT 269 313 360 127   Basic Metabolic Panel: Recent Labs  Lab 01/26/19 0621 01/26/19 1014 01/27/19 0305 01/28/19 1111 01/29/19 0414  NA 139  --  139 141 139  K 2.8*  --  3.9 3.9 2.8*  CL 107  --  112* 113* 110  CO2 20*  --  20* 20* 22  GLUCOSE 97  --  135* 145* 95  BUN 6  --  '8 15 10  ' CREATININE 1.19*  --  0.90 0.93 0.80  CALCIUM 7.4*  --  7.6* 8.2* 7.2*  MG  --  1.6* 2.2  --   --    GFR: Estimated Creatinine Clearance: 105.5 mL/min (by C-G formula based on SCr of 0.8 mg/dL). Liver Function Tests: Recent Labs  Lab 01/26/19 0621 01/28/19 1111 01/29/19 0414  AST '17 31 31  ' ALT '19 19 31  ' ALKPHOS 98 66 53  BILITOT 1.0 0.4 0.5  PROT 5.5* 5.1* 4.4*  ALBUMIN 2.0* 2.0* 1.7*   No results for input(s): LIPASE, AMYLASE in the  last 168 hours. No results for input(s): AMMONIA in the last 168 hours. Coagulation Profile: No results for input(s): INR, PROTIME in the last 168 hours. Cardiac Enzymes: No results for input(s): CKTOTAL, CKMB, CKMBINDEX, TROPONINI in the last 168 hours. BNP (last 3 results) No results for input(s): PROBNP in the last 8760 hours. HbA1C: No results for input(s): HGBA1C in the last 72 hours. CBG: Recent Labs  Lab 01/27/19 0339  GLUCAP 121*   Lipid Profile: No results for input(s): CHOL, HDL, LDLCALC, TRIG, CHOLHDL, LDLDIRECT in the last 72 hours. Thyroid Function Tests: No results for input(s): TSH, T4TOTAL, FREET4, T3FREE, THYROIDAB in the last 72 hours. Anemia Panel: Recent Labs    01/27/19 1017  FERRITIN 221   Sepsis Labs: Recent Labs  Lab 01/26/19 [phone]  01/26/19 0840 01/27/19 1017  PROCALCITON 0.22  --  <0.10  LATICACIDVEN 0.7 0.8  --     Recent Results (from the past 240 hour(s))  SARS Coronavirus 2 (CEPHEID- Performed in [facility] hospital lab), Hosp Order     Status: None   Collection Time: 01/26/19  5:50 AM   Specimen: Nasopharyngeal Swab  Result Value Ref Range Status   SARS Coronavirus 2 NEGATIVE NEGATIVE Final    Comment: (NOTE) If result is NEGATIVE SARS-CoV-2 target nucleic acids are NOT DETECTED. The SARS-CoV-2 RNA is generally detectable in upper and lower  respiratory specimens during the acute phase of infection. The lowest  concentration of SARS-CoV-2 viral copies this assay can detect is 250  copies / mL. A negative result does not preclude SARS-CoV-2 infection  and should not be used as the sole basis for treatment or other  patient management decisions.  A negative result may occur with  improper specimen collection / handling, submission of specimen other  than nasopharyngeal swab, presence of viral mutation(s) within the  areas targeted by this assay, and inadequate number of viral copies  (<250 copies / mL). A negative result must be  combined with clinical  observations, patient history, and epidemiological information. If result is POSITIVE SARS-CoV-2 target nucleic acids are DETECTED. The SARS-CoV-2 RNA is generally detectable in upper and lower  respiratory specimens dur ing the acute phase of infection.  Positive  results are indicative of active infection with SARS-CoV-2.  Clinical  correlation with patient history and other diagnostic information is  necessary to determine patient infection status.  Positive results do  not rule out bacterial infection or co-infection with other viruses. If result is PRESUMPTIVE POSTIVE SARS-CoV-2 nucleic acids MAY BE PRESENT.   A presumptive positive result was obtained on the submitted specimen  and confirmed on repeat testing.  While 2019 novel coronavirus  (SARS-CoV-2) nucleic acids may be present in the submitted sample  additional confirmatory testing may be necessary for epidemiological  and / or clinical management purposes  to differentiate between  SARS-CoV-2 and other Sarbecovirus currently known to infect humans.  If clinically indicated additional testing with an alternate test  methodology [phone]) is advised. The SARS-CoV-2 RNA is generally  detectable in upper and lower respiratory sp ecimens during the acute  phase of infection. The expected result is Negative. Fact Sheet for Patients:  StrictlyIdeas.no Fact Sheet for Healthcare Providers: BankingDealers.co.za This test is not yet approved or cleared by the [place] FDA and has been authorized for detection and/or diagnosis of SARS-CoV-2 by FDA under an Emergency Use Authorization (EUA).  This EUA will remain in effect (meaning this test can be used) for the duration of the COVID-19 declaration under Section 564(b)(1) of the Act, 21 U.S.C. section 360bbb-3(b)(1), unless the authorization is terminated or revoked sooner. Performed at [facility], [place] [address], [place], [place] [address]   Blood Culture (routine x 2)     Status: None (Preliminary result)   Collection Time: 01/26/19  6:20 AM   Specimen: BLOOD  Result Value Ref Range Status   Specimen Description BLOOD RIGHT ANTECUBITAL  Final   Special Requests   Final    BOTTLES DRAWN AEROBIC AND ANAEROBIC Blood Culture adequate volume   Culture   Final    NO GROWTH 2 DAYS Performed at [facility], [place] [address], [place], [place] [address]    Report Status PENDING  Incomplete  Blood Culture (routine x 2)  Status: None (Preliminary result)   Collection Time: 01/26/19  6:40 AM   Specimen: BLOOD RIGHT HAND  Result Value Ref Range Status   Specimen Description BLOOD RIGHT HAND  Final   Special Requests   Final    BOTTLES DRAWN AEROBIC AND ANAEROBIC Blood Culture adequate volume   Culture   Final    NO GROWTH 2 DAYS Performed at [facility], [address] [place], [place] [address]    Report Status PENDING  Incomplete  Respiratory Panel by PCR     Status: None   Collection Time: 01/26/19  6:58 PM   Specimen: Nasopharyngeal Swab; Respiratory  Result Value Ref Range Status   Adenovirus NOT DETECTED NOT DETECTED Final   Coronavirus 229E NOT DETECTED NOT DETECTED Final    Comment: (NOTE) The Coronavirus on the Respiratory Panel, DOES NOT test for the novel  Coronavirus (2019 nCoV)    Coronavirus HKU1 NOT DETECTED NOT DETECTED Final   Coronavirus NL63 NOT DETECTED NOT DETECTED Final   Coronavirus OC43 NOT DETECTED NOT DETECTED Final   Metapneumovirus NOT DETECTED NOT DETECTED Final   Rhinovirus / Enterovirus NOT DETECTED NOT DETECTED Final   Influenza A NOT DETECTED NOT DETECTED Final   Influenza B NOT DETECTED NOT DETECTED Final   Parainfluenza Virus 1 NOT DETECTED NOT DETECTED Final   Parainfluenza Virus 2 NOT DETECTED NOT DETECTED Final   Parainfluenza Virus 3 NOT DETECTED NOT DETECTED Final   Parainfluenza Virus 4 NOT DETECTED NOT DETECTED Final    Respiratory Syncytial Virus NOT DETECTED NOT DETECTED Final   Bordetella pertussis NOT DETECTED NOT DETECTED Final   Chlamydophila pneumoniae NOT DETECTED NOT DETECTED Final   Mycoplasma pneumoniae NOT DETECTED NOT DETECTED Final    Comment: Performed at [facility], [place]. [address], [place], [place] [address]  MRSA PCR Screening     Status: None   Collection Time: 01/28/19 10:20 AM   Specimen: Nasal Mucosa; Nasopharyngeal  Result Value Ref Range Status   MRSA by PCR NEGATIVE NEGATIVE Final    Comment:        The GeneXpert MRSA Assay (FDA approved for NASAL specimens only), is one component of a comprehensive MRSA colonization surveillance program. It is not intended to diagnose MRSA infection nor to guide or monitor treatment for MRSA infections. Performed at [facility], [place] [address], [place], [place] [address]       Radiology Studies: Ct Angio Chest Pe W Or Wo Contrast  Result Date: 01/28/2019 CLINICAL DATA:  Shortness of breath EXAM: CT ANGIOGRAPHY CHEST WITH CONTRAST TECHNIQUE: Multidetector CT imaging of the chest was performed using the standard protocol during bolus administration of intravenous contrast. Multiplanar CT image reconstructions and MIPs were obtained to evaluate the vascular anatomy. CONTRAST:  74m OMNIPAQUE IOHEXOL 350 MG/ML SOLN COMPARISON:  Chest CT January 25, 2019; chest radiograph January 26, 2019 FINDINGS: Cardiovascular: There is no demonstrable pulmonary embolus. There is no appreciable thoracic aortic aneurysm or dissection. Visualized great vessels appear unremarkable. There is a small pericardial effusion which may be within physiologic range. The pericardium does not appear thickened. Mediastinum/Nodes: Visualized thyroid appears unremarkable. There is no appreciable thoracic adenopathy. No esophageal lesions are evident. Lungs/Pleura: There are moderate free-flowing pleural effusions bilaterally. There is consolidation in both lower lobes.  There is mild atelectatic change in each upper lobe region. Upper Abdomen: Visualized upper abdominal structures appear unremarkable. Musculoskeletal: There remains extensive soft tissues/subcutaneous thickening throughout the left breast with diffuse edema  within the left breast consistent with mastitis. No blastic or lytic bone lesions are evident. Review of the MIP images confirms the above findings. IMPRESSION: 1. No demonstrable pulmonary embolus. No thoracic aortic aneurysm or dissection. 2. Bilateral free-flowing pleural effusions. Extensive bilateral lower lobe airspace consolidation. There is mild upper lobe atelectatic change bilaterally. 3. Left breast edema and apparent cellulitis consistent with mastitis. This finding has been noted previously. 4.  No appreciable adenopathy. 5. Small pericardial effusion which may be within physiologic range. Electronically Signed   By: Lowella Grip III M.D.   On: 01/28/2019 14:18    Scheduled Meds:  albuterol  2.5 mg Nebulization BID   enoxaparin (LOVENOX) injection  40 mg Subcutaneous Q24H   guaiFENesin  600 mg Oral BID   pantoprazole (PROTONIX) IV  40 mg Intravenous Q12H   potassium chloride  40 mEq Oral Q6H   sodium chloride flush  10-40 mL Intracatheter Q12H   sodium chloride flush  3 mL Intravenous Q12H   Continuous Infusions:  sodium chloride 75 mL/hr at 01/29/19 0928   azithromycin Stopped (01/28/19 2112)   cefTRIAXone (ROCEPHIN)  IV Stopped (01/28/19 2112)   vancomycin Stopped (01/28/19 2214)     LOS: 3 days   Assessment & Plan:   Principal Problem:   Mastitis Active Problems:   Acute on chronic respiratory failure with hypoxia (HCC)   HCAP (healthcare-associated pneumonia)   Transient hypotension   AKI (acute kidney injury) ([place])   Hypokalemia   Normocytic anemia   CAP (community acquired pneumonia)   Acute hypoxic respiratory failure and sepsis secondary to multifocal/bilateral pneumonia: She met sepsis  criteria based on leukocytosis in addition to tachycardia.  Patient is currently requiring only 3 L of oxygen however she is saturating 99%.  Placed in order to taper oxygen. Leukocytosis slightly better today but she developed 101 fever yesterday.  She remains on Rocephin, azithromycin and vancomycin per ID recommendations.  COVID negative.  CT chest with angiogram ruled out PE.  Sepsis secondary to left breast mastitis: The above-mentioned antibiotics.  She was evaluated by surgery and no further recommendations for intervention were done.  She is yet to be seen by ID.  Normocytic anemia: Hemoglobin is stable.  Will check iron studies.  Hypokalemia: 2.8.  Will replace.  Recheck in the morning.  DVT prophylaxis: Lovenox Code Status: Full code Family Communication: None present at bedside.  Patient verbalized understanding of our conversation. Received a message from RN that her husband wants a call from me. I called Mr. Keeana Pieratt and spoke to him for about 10 minutes and he asked several reasonable question which were answered to his satisfaction.  Disposition Plan: To be determined however I expect potential discharge in next 24- 48 hours.   Time spent: 28 minutes   Darliss Cheney, MD [facility] Pager [phone]  If 7PM-7AM, please contact night-coverage [email or web address] Password [facility] 01/29/2019, 10:27 AM

## 2019-01-30 ENCOUNTER — Encounter (HOSPITAL_COMMUNITY): Payer: Self-pay | Admitting: *Deleted

## 2019-01-30 LAB — FOLATE RBC
Folate, Hemolysate: 237 ng/mL
Folate, RBC: 898 ng/mL (ref >498)
Hematocrit: 26.4 % — ABNORMAL LOW (ref 34.0–46.6)

## 2019-01-30 LAB — COMPREHENSIVE METABOLIC PANEL
ALT: 33 U/L (ref 0–44)
AST: 31 U/L (ref 15–41)
Albumin: 1.7 g/dL — ABNORMAL LOW (ref 3.5–5.0)
Alkaline Phosphatase: 63 U/L (ref 38–126)
Anion gap: 8 (ref 5–15)
BUN: 5 mg/dL — ABNORMAL LOW (ref 6–20)
CO2: 23 mmol/L (ref 22–32)
Calcium: 7.6 mg/dL — ABNORMAL LOW (ref 8.9–10.3)
Chloride: 107 mmol/L (ref 98–111)
Creatinine, Ser: 0.82 mg/dL (ref 0.44–1.00)
GFR calc Af Amer: >60 mL/min (ref >60)
GFR calc non Af Amer: >60 mL/min (ref >60)
Glucose, Bld: 90 mg/dL (ref 70–99)
Potassium: 3.1 mmol/L — ABNORMAL LOW (ref 3.5–5.1)
Sodium: 138 mmol/L (ref 135–145)
Total Bilirubin: 0.6 mg/dL (ref 0.3–1.2)
Total Protein: 4.7 g/dL — ABNORMAL LOW (ref 6.5–8.1)

## 2019-01-30 LAB — CBC WITH DIFFERENTIAL/PLATELET
Abs Immature Granulocytes: 0.9 10*3/uL — ABNORMAL HIGH (ref 0.00–0.07)
Basophils Absolute: 0.1 10*3/uL (ref 0.0–0.1)
Basophils Relative: 0 %
Eosinophils Absolute: 0.1 10*3/uL (ref 0.0–0.5)
Eosinophils Relative: 1 %
HCT: 26.4 % — ABNORMAL LOW (ref 36.0–46.0)
Hemoglobin: 9 g/dL — ABNORMAL LOW (ref 12.0–15.0)
Immature Granulocytes: 5 %
Lymphocytes Relative: 18 %
Lymphs Abs: 3.5 10*3/uL (ref 0.7–4.0)
MCH: 26.6 pg (ref 26.0–34.0)
MCHC: 34.1 g/dL (ref 30.0–36.0)
MCV: 78.1 fL — ABNORMAL LOW (ref 80.0–100.0)
Monocytes Absolute: 1.3 10*3/uL — ABNORMAL HIGH (ref 0.1–1.0)
Monocytes Relative: 7 %
Neutro Abs: 13.4 10*3/uL — ABNORMAL HIGH (ref 1.7–7.7)
Neutrophils Relative %: 69 %
Platelets: 322 10*3/uL (ref 150–400)
RBC: 3.38 MIL/uL — ABNORMAL LOW (ref 3.87–5.11)
RDW: 14.9 % (ref 11.5–15.5)
WBC: 19.3 10*3/uL — ABNORMAL HIGH (ref 4.0–10.5)
nRBC: 0.3 % — ABNORMAL HIGH (ref 0.0–0.2)

## 2019-01-30 LAB — NOVEL CORONAVIRUS, NAA (HOSP ORDER, SEND-OUT TO REF LAB; TAT 18-24 HRS): SARS-CoV-2, NAA: NOT DETECTED

## 2019-01-30 MED ORDER — PANTOPRAZOLE SODIUM 40 MG PO TBEC
40.0000 mg | DELAYED_RELEASE_TABLET | Freq: Two times a day (BID) | ORAL | Status: DC
  Administered 2019-01-30 – 2019-01-31 (×3): 40 mg via ORAL
  Filled 2019-01-30 (×3): qty 1

## 2019-01-30 MED ORDER — AMOXICILLIN-POT CLAVULANATE 875-125 MG PO TABS
1.0000 | ORAL_TABLET | Freq: Two times a day (BID) | ORAL | Status: DC
  Administered 2019-01-30 – 2019-01-31 (×2): 1 via ORAL
  Filled 2019-01-30 (×2): qty 1

## 2019-01-30 MED ORDER — POTASSIUM CHLORIDE CRYS ER 20 MEQ PO TBCR
40.0000 meq | EXTENDED_RELEASE_TABLET | ORAL | Status: AC
  Administered 2019-01-30 (×2): 40 meq via ORAL
  Filled 2019-01-30 (×2): qty 2

## 2019-01-30 NOTE — Progress Notes (Signed)
PROGRESS NOTE    [facility]  GGY:[id number] DOB: 04-22-1991 DOA: 01/26/2019 PCP: Patient, No Pcp Per     Brief Narrative:  Erica Royalty Mohydinis a 28 y.o.femalewithoutsignificant past medical history; who presented breast discomfort and enlargement. Patient reported that she initially noted a lump on her left breast on June 24. She followed up with [facility] 6 days later, and was started started on antibiotics. Treated with doxycycline and subsequently Augmentinwithout any improvement in symptoms. Associated symptoms included fevers and chills. Patient had been sent for ultrasound of the breast on 7/8, which revealed worsening infection and with no signs of an abscess. She was advised to come to the emergency room at [facility] 7/8, where she was admitted into the hospital at that time. Blood cultures were obtained along with placing the patient on empiric antibiotics of vancomycin and Rocephin for treatment of sepsis secondary to mastitis of the left breast. Vital signs were noted to be stable at that time. Admission lab work revealed WBC 20.4, hemoglobin 13.5, platelets 336, creatinine 0.46, and lactic acid 0.8. Despite antibiotics patient reportedly still continued to spike fever up 103 Fand showed worsening white blood cell count. Patient reportedly became more short of breath on around 17 2020.Labs on 7/10 revealed WBC 22, hemoglobin 10.2, creatinine 1.5, troponin negative, and d-dimer 1.98. CT scan of the chest with contrast was obtained showing signs of a multifocal pneumonia and left mastitis without signs of fluid collection. Patient was noted to be hypotensive and temporarily placed on dopamine and was on nasal cannula oxygen. Patient was transferred prior to COVID-19 testing as there was a 24-hour turnaround time. Transfer was requested and patient had initially been transferred [facility]ED here at [facility]. 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 She really has not had a cough but has felt short of breath to the point which makes her cough. She complained of heaviness in her chest and pain with taking deep inspiratory breaths.  Upon further questioning, she states that her father-in-law who was suffering from lung cancer died of pneumonia just 3 weeks ago and the lived in the same house.  That is the only contact she has.  There is no other family member sick with similar pneumonia or any respiratory symptoms.  When she arrived here at emergency department, she was hypoxic requiring 4 L of nasal cannula with white cells of 23,000.  Also hypokalemia with potassium of 2.8 and acute kidney kidney with creatinine of 1.19.  She was tested negative for COVID and was admitted under hospitalist service and was placed on IV Rocephin, Zithromax and vancomycin as well as Solu-Medrol.  General surgery and infectious disease were consulted.  General surgery recommended no surgical intervention.  Due to worsening shortness of breath however improving oxygenation, CT angiogram of the chest was done and she was ruled out of PE but this once again showed bilateral infiltrates.   New events last 24 hours / Subjective: No new events, afebrile overnight.  Continues to have left breast pain.  No shortness of breath at rest, worsens with ambulation.  Assessment & Plan:   Principal Problem:   Mastitis Active Problems:   Acute on chronic respiratory failure with hypoxia (HCC)   HCAP (healthcare-associated pneumonia)   Transient hypotension   AKI (acute kidney injury) ([place])   Hypokalemia   Normocytic anemia   CAP (community acquired pneumonia)    Acute hypoxic respiratory failure and sepsis secondary to multifocal pneumonia: Currently on  Rocephin.  Appreciate infectious disease.  Weaned off oxygen today, encourage incentive spirometer  Sepsis secondary to left breast mastitis: She was evaluated by surgery and no further  recommendations for intervention.  Blood cultures negative to date.  Continue Rocephin  Normocytic anemia: Hemoglobin is stable.    Ferritin within normal limit, low iron as well as low TIBC which is not consistent with iron deficiency anemia  Hypokalemia:  Replace, trend   DVT prophylaxis: Lovenox Code Status: Full code Family Communication: None Disposition Plan: Pending clinical improvement, hopeful discharge home 7/16   Consultants:   General surgery  Infectious disease  Procedures:   None  Antimicrobials:  Anti-infectives (From admission, onward)   Start     Dose/Rate Route Frequency Ordered Stop   01/26/19 1630  vancomycin (VANCOCIN) 1,500 mg in sodium chloride 0.9 % 500 mL IVPB  Status:  Discontinued     1,500 mg 250 mL/hr over 120 Minutes Intravenous Every 24 hours 01/26/19 1514 01/29/19 1037   01/26/19 1400  azithromycin (ZITHROMAX) 500 mg in sodium chloride 0.9 % 250 mL IVPB  Status:  Discontinued     500 mg 250 mL/hr over 60 Minutes Intravenous Every 24 hours 01/26/19 1220 01/29/19 1037   01/26/19 1200  cefTRIAXone (ROCEPHIN) 1 g in sodium chloride 0.9 % 100 mL IVPB     1 g 200 mL/hr over 30 Minutes Intravenous Every 24 hours 01/26/19 0942     01/26/19 0945  vancomycin (VANCOCIN) IVPB 1000 mg/200 mL premix  Status:  Discontinued     1,000 mg 200 mL/hr over 60 Minutes Intravenous Every 24 hours 01/26/19 0942 01/26/19 1009        Objective: Vitals:   01/30/19 0003 01/30/19 0342 01/30/19 0405 01/30/19 0742  BP: 122/80 109/68 117/82 124/81  Pulse: 86 82 79 81  Resp: 12 (!) 26 16 15   Temp: 98.9 F (37.2 C) 98.5 F (36.9 C) 98.4 F (36.9 C)   TempSrc: Oral Oral Oral   SpO2: 92% 96% 96% 95%  Weight:  80 kg    Height:        Intake/Output Summary (Last 24 hours) at 01/30/2019 1346 Last data filed at 01/30/2019 0947 Gross per 24 hour  Intake 1758.54 ml  Output --  Net 1758.54 ml   Filed Weights   01/26/19 1100 01/29/19 0537 01/30/19 0342  Weight:  71.7 kg 78.9 kg 80 kg    Examination:  General exam: Appears calm and comfortable  Respiratory system: Clear to auscultation. Respiratory effort normal. Cardiovascular system: S1 & S2 heard, RRR. No JVD, murmurs, rubs, gallops or clicks. No pedal edema. Gastrointestinal system: Abdomen is nondistended, soft and nontender. No organomegaly or masses felt. Normal bowel sounds heard. Central nervous system: Alert and oriented. No focal neurological deficits. Extremities: Symmetric 5 x 5 power. Skin: Left breast outer lower quadrant with erythema and warmth Psychiatry: Judgement and insight appear normal. Mood & affect appropriate.   Data Reviewed: I have personally reviewed following labs and imaging studies  CBC: Recent Labs  Lab 01/26/19 0621 01/27/19 0305 01/28/19 1111 01/29/19 0414 01/29/19 1201 01/30/19 0415  WBC 23.4* 20.2* 24.5* 17.8*  --  19.3*  NEUTROABS 18.5*  --  19.8* 12.1*  --  13.4*  HGB 10.0* 10.4* 9.6* 8.6*  --  9.0*  HCT 30.2* 29.7* 27.4* 25.3* 26.4* 26.4*  MCV 80.7 77.5* 76.3* 78.1*  --  78.1*  PLT 269 313 360 322  --  322   Basic Metabolic Panel: Recent Labs  Lab 01/26/19 0621 01/26/19 1014 01/27/19 0305 01/28/19 1111 01/29/19 0414 01/30/19 0415  NA 139  --  139 141 139 138  K 2.8*  --  3.9 3.9 2.8* 3.1*  CL 107  --  112* 113* 110 107  CO2 20*  --  20* 20* 22 23  GLUCOSE 97  --  135* 145* 95 90  BUN 6  --  5*  CREATININE 1.19*  --  0.90 0.93 0.80 0.82  CALCIUM 7.4*  --  7.6* 8.2* 7.2* 7.6*  MG  --  1.6* 2.2  --   --   --    GFR: Estimated Creatinine Clearance: 103.6 mL/min (by C-G formula based on SCr of 0.82 mg/dL). Liver Function Tests: Recent Labs  Lab 01/26/19 0621 01/28/19 1111 01/29/19 0414 01/30/19 0415  AST ALT 33  ALKPHOS 98 66 53 63  BILITOT 1.0 0.4 0.5 0.6  PROT 5.5* 5.1* 4.4* 4.7*  ALBUMIN 2.0* 2.0* 1.7* 1.7*   No results for input(s): LIPASE, AMYLASE in the last 168 hours. No results for  input(s): AMMONIA in the last 168 hours. Coagulation Profile: No results for input(s): INR, PROTIME in the last 168 hours. Cardiac Enzymes: No results for input(s): CKTOTAL, CKMB, CKMBINDEX, TROPONINI in the last 168 hours. BNP (last 3 results) No results for input(s): PROBNP in the last 8760 hours. HbA1C: No results for input(s): HGBA1C in the last 72 hours. CBG: Recent Labs  Lab 01/27/19 0339  GLUCAP 121*   Lipid Profile: No results for input(s): CHOL, HDL, LDLCALC, TRIG, CHOLHDL, LDLDIRECT in the last 72 hours. Thyroid Function Tests: No results for input(s): TSH, T4TOTAL, FREET4, T3FREE, THYROIDAB in the last 72 hours. Anemia Panel: Recent Labs    01/29/19 1201  VITAMINB12 1,091*  TIBC 195*  IRON 12*  RETICCTPCT 1.5   Sepsis Labs: Recent Labs  Lab 01/26/19 0621 01/26/19 0840 01/27/19 1017  PROCALCITON 0.22  --  <0.10  LATICACIDVEN 0.7 0.8  --     Recent Results (from the past 240 hour(s))  SARS Coronavirus 2 (CEPHEID- Performed in [facility] hospital lab), Hosp Order     Status: None   Collection Time: 01/26/19  5:50 AM   Specimen: Nasopharyngeal Swab  Result Value Ref Range Status   SARS Coronavirus 2 NEGATIVE NEGATIVE Final    Comment: (NOTE) If result is NEGATIVE SARS-CoV-2 target nucleic acids are NOT DETECTED. The SARS-CoV-2 RNA is generally detectable in upper and lower  respiratory specimens during the acute phase of infection. The lowest  concentration of SARS-CoV-2 viral copies this assay can detect is 250  copies / mL. A negative result does not preclude SARS-CoV-2 infection  and should not be used as the sole basis for treatment or other  patient management decisions.  A negative result may occur with  improper specimen collection / handling, submission of specimen other  than nasopharyngeal swab, presence of viral mutation(s) within the  areas targeted by this assay, and inadequate number of viral copies  (<250 copies / mL). A negative result  must be combined with clinical  observations, patient history, and epidemiological information. If result is POSITIVE SARS-CoV-2 target nucleic acids are DETECTED. The SARS-CoV-2 RNA is generally detectable in upper and lower  respiratory specimens dur ing the acute phase of infection.  Positive  results are indicative of active infection with SARS-CoV-2.  Clinical  correlation with patient history and other diagnostic information is  necessary to  determine patient infection status.  Positive results do  not rule out bacterial infection or co-infection with other viruses. If result is PRESUMPTIVE POSTIVE SARS-CoV-2 nucleic acids MAY BE PRESENT.   A presumptive positive result was obtained on the submitted specimen  and confirmed on repeat testing.  While 2019 novel coronavirus  (SARS-CoV-2) nucleic acids may be present in the submitted sample  additional confirmatory testing may be necessary for epidemiological  and / or clinical management purposes  to differentiate between  SARS-CoV-2 and other Sarbecovirus currently known to infect humans.  If clinically indicated additional testing with an alternate test  methodology [phone]) is advised. The SARS-CoV-2 RNA is generally  detectable in upper and lower respiratory sp ecimens during the acute  phase of infection. The expected result is Negative. Fact Sheet for Patients:  BoilerBrush.com.cy Fact Sheet for Healthcare Providers: https://pope.com/ This test is not yet approved or cleared by the [place] FDA and has been authorized for detection and/or diagnosis of SARS-CoV-2 by FDA under an Emergency Use Authorization (EUA).  This EUA will remain in effect (meaning this test can be used) for the duration of the COVID-19 declaration under Section 564(b)(1) of the Act, 21 U.S.C. section 360bbb-3(b)(1), unless the authorization is terminated or revoked sooner. Performed at [facility], [address] [place], [place] [address]   Blood Culture (routine x 2)     Status: None (Preliminary result)   Collection Time: 01/26/19  6:20 AM   Specimen: BLOOD  Result Value Ref Range Status   Specimen Description BLOOD RIGHT ANTECUBITAL  Final   Special Requests   Final    BOTTLES DRAWN AEROBIC AND ANAEROBIC Blood Culture adequate volume   Culture   Final    NO GROWTH 3 DAYS Performed at [facility], [address], [place], [place] [address]    Report Status PENDING  Incomplete  Blood Culture (routine x 2)     Status: None (Preliminary result)   Collection Time: 01/26/19  6:40 AM   Specimen: BLOOD RIGHT HAND  Result Value Ref Range Status   Specimen Description BLOOD RIGHT HAND  Final   Special Requests   Final    BOTTLES DRAWN AEROBIC AND ANAEROBIC Blood Culture adequate volume   Culture   Final    NO GROWTH 3 DAYS Performed at [facility], [address], [place], [place] [address]    Report Status PENDING  Incomplete  Respiratory Panel by PCR     Status: None   Collection Time: 01/26/19  6:58 PM   Specimen: Nasopharyngeal Swab; Respiratory  Result Value Ref Range Status   Adenovirus NOT DETECTED NOT DETECTED Final   Coronavirus 229E NOT DETECTED NOT DETECTED Final    Comment: (NOTE) The Coronavirus on the Respiratory Panel, DOES NOT test for the novel  Coronavirus (2019 nCoV)    Coronavirus HKU1 NOT DETECTED NOT DETECTED Final   Coronavirus NL63 NOT DETECTED NOT DETECTED Final   Coronavirus OC43 NOT DETECTED NOT DETECTED Final   Metapneumovirus NOT DETECTED NOT DETECTED Final   Rhinovirus / Enterovirus NOT DETECTED NOT DETECTED Final   Influenza A NOT DETECTED NOT DETECTED Final   Influenza B NOT DETECTED NOT DETECTED Final   Parainfluenza Virus 1 NOT DETECTED NOT DETECTED Final   Parainfluenza Virus 2 NOT DETECTED NOT DETECTED Final   Parainfluenza Virus 3 NOT DETECTED NOT DETECTED Final   Parainfluenza Virus 4 NOT DETECTED NOT  DETECTED Final   Respiratory Syncytial Virus  NOT DETECTED NOT DETECTED Final   Bordetella pertussis NOT DETECTED NOT DETECTED Final   Chlamydophila pneumoniae NOT DETECTED NOT DETECTED Final   Mycoplasma pneumoniae NOT DETECTED NOT DETECTED Final    Comment: Performed at [facility], [address] [place], [place] [address]  Novel Coronavirus, NAA (hospital order; send-out to ref lab)     Status: None   Collection Time: 01/26/19  6:58 PM  Result Value Ref Range Status   SARS-CoV-2, NAA NOT DETECTED NOT DETECTED Final    Comment: (NOTE) This test was developed and its performance characteristics determined by [facility]. This test has not been FDA cleared or approved. This test has been authorized by FDA under an Emergency Use Authorization (EUA). This test is only authorized for the duration of time the declaration that circumstances exist justifying the authorization of the emergency use of in vitro diagnostic tests for detection of SARS-CoV-2 virus and/or diagnosis of COVID-19 infection under section 564(b)(1) of the Act, 21 U.S.C. 191YNW-2(N)(5360bbb-3(b)(1), unless the authorization is terminated or revoked sooner. When diagnostic testing is negative, the possibility of a false negative result should be considered in the context of a patient's recent exposures and the presence of clinical signs and symptoms consistent with COVID-19. An individual without symptoms of COVID-19 and who is not shedding SARS-CoV-2 virus would expect to have a negative (not detected) result in this assay. Performed  At: [facility]  [address] [place], [place] [id number] Jolene SchimkeNagendra Sanjai MD QI:[id number]:[phone]    Coronavirus Source NASOPHARYNGEAL  Final    Comment: Performed at [facility], [address] [place], [place] [address]  MRSA PCR Screening     Status: None   Collection Time: 01/28/19 10:20 AM   Specimen: Nasal Mucosa; Nasopharyngeal  Result Value Ref Range Status    MRSA by PCR NEGATIVE NEGATIVE Final    Comment:        The GeneXpert MRSA Assay (FDA approved for NASAL specimens only), is one component of a comprehensive MRSA colonization surveillance program. It is not intended to diagnose MRSA infection nor to guide or monitor treatment for MRSA infections. Performed at [facility], [address] [place], [place] [address]       Radiology Studies: Ct Angio Chest Pe W Or Wo Contrast  Result Date: 01/28/2019 CLINICAL DATA:  Shortness of breath EXAM: CT ANGIOGRAPHY CHEST WITH CONTRAST TECHNIQUE: Multidetector CT imaging of the chest was performed using the standard protocol during bolus administration of intravenous contrast. Multiplanar CT image reconstructions and MIPs were obtained to evaluate the vascular anatomy. CONTRAST:  50mL OMNIPAQUE IOHEXOL 350 MG/ML SOLN COMPARISON:  Chest CT January 25, 2019; chest radiograph January 26, 2019 FINDINGS: Cardiovascular: There is no demonstrable pulmonary embolus. There is no appreciable thoracic aortic aneurysm or dissection. Visualized great vessels appear unremarkable. There is a small pericardial effusion which may be within physiologic range. The pericardium does not appear thickened. Mediastinum/Nodes: Visualized thyroid appears unremarkable. There is no appreciable thoracic adenopathy. No esophageal lesions are evident. Lungs/Pleura: There are moderate free-flowing pleural effusions bilaterally. There is consolidation in both lower lobes. There is mild atelectatic change in each upper lobe region. Upper Abdomen: Visualized upper abdominal structures appear unremarkable. Musculoskeletal: There remains extensive soft tissues/subcutaneous thickening throughout the left breast with diffuse edema within the left breast consistent with mastitis. No blastic or lytic bone lesions are evident. Review of the MIP images confirms the above findings. IMPRESSION: 1. No demonstrable pulmonary embolus. No thoracic aortic  aneurysm or dissection. 2. Bilateral free-flowing pleural effusions. Extensive bilateral lower lobe airspace consolidation. There is mild upper lobe atelectatic change bilaterally. 3. Left breast edema and apparent cellulitis consistent with mastitis. This finding has been noted previously. 4.  No appreciable adenopathy. 5. Small pericardial effusion which may be within physiologic range. Electronically Signed   By: Bretta BangWilliam  Woodruff III M.D.   On: 01/28/2019 14:18      Scheduled Meds:  enoxaparin (LOVENOX) injection  40 mg Subcutaneous Q24H   guaiFENesin  600 mg Oral BID   pantoprazole  40 mg Oral BID AC   sodium chloride flush  10-40 mL Intracatheter Q12H   sodium chloride flush  3 mL Intravenous Q12H   Continuous Infusions:  cefTRIAXone (ROCEPHIN)  IV 1 g (01/30/19 1248)     LOS: 4 days     Time spent: 35 minutes   Noralee StainJennifer Norlan Rann, DO [facility] [email or web address] 01/30/2019, 1:46 PM

## 2019-01-30 NOTE — Plan of Care (Signed)
  Problem: Education: Goal: Knowledge of General Education information will improve Description Including pain rating scale, medication(s)/side effects and non-pharmacologic comfort measures Outcome: Progressing   Problem: Health Behavior/Discharge Planning: Goal: Ability to manage health-related needs will improve Outcome: Progressing   Problem: Clinical Measurements: Goal: Ability to maintain clinical measurements within normal limits will improve Outcome: Progressing Goal: Will remain free from infection Outcome: Progressing Goal: Diagnostic test results will improve Outcome: Progressing Goal: Respiratory complications will improve Outcome: Progressing Goal: Cardiovascular complication will be avoided Outcome: Progressing   Problem: Activity: Goal: Risk for activity intolerance will decrease Outcome: Progressing   Problem: Coping: Goal: Level of anxiety will decrease Outcome: Progressing   Problem: Pain Managment: Goal: General experience of comfort will improve Outcome: Progressing   Problem: Safety: Goal: Ability to remain free from injury will improve Outcome: Progressing   

## 2019-01-30 NOTE — Progress Notes (Signed)
Patient is on RA and her sats are maintaining in upper 90's

## 2019-01-30 NOTE — Progress Notes (Signed)
[facility]  Date of Admission:  01/26/2019      Total days of antibiotics 8      Day 5 ceftriaxone           ASSESSMENT: Erica Williamson has multifocal pneumonia with hypoxia that developed after treatment for left breast mastitis. COVID-19 negative x 2 with 3rd test in process. RVP (-). HIV (-). PCT < 10 now. CTA (-) PE but with bilateral pulmonary effusions and multifocal pneumonia. Aspiration pneumonia/pneumonitis vs CAP.  Erica Williamson is breathing comfortably and able to perform ADLs on room air. Encouraged her to continue doing as much independently and walking on room air to follow her progress today with likely discharge in the AM if all continues to go smoothly. She is comfortable with this plan.   No further nausea/vomiting and tolerating meals much better.   Regarding her mastitis would continue treatment with 4 more days of PO Augmentin therapy to complete nearly 14 days. She should have quick follow up care with her GYN/PCP to see this through to resolution and help direct further testing if indicated to determine the cause of her non-lactational mastitis. Of note she tells me her mother had breast cancer.    PLAN: 1. Convert to augmentin BID to complete 4 more days 2. Monitor on room air tonight with possible D/C in AM 3.     Principal Problem:   Mastitis Active Problems:   Acute on chronic respiratory failure with hypoxia (HCC)   HCAP (healthcare-associated pneumonia)   Transient hypotension   AKI (acute kidney injury) (HCC)   Hypokalemia   Normocytic anemia   CAP (community acquired pneumonia)   . enoxaparin (LOVENOX) injection  40 mg Subcutaneous Q24H  . guaiFENesin  600 mg Oral BID  . pantoprazole  40 mg Oral BID AC  . potassium chloride  40 mEq Oral Q4H  . sodium chloride flush  10-40 mL Intracatheter Q12H  . sodium chloride flush  3 mL Intravenous Q12H    SUBJECTIVE: She has been on room air for a few hours now breathing  comfortably and saturating well. She says she is ready to go home when we think she is.   The swelling in her left breast is still present - she mentions that it is "50% better." However she is having tenderness along the underside of the breast, ongoing nipple inversion and swelling.   MRSA PCR negative. HIV neg on admission.   Review of Systems: Review of Systems  All other systems reviewed and are negative. See HPI for details  No Known Allergies  OBJECTIVE: Vitals:   01/30/19 0003 01/30/19 0342 01/30/19 0405 01/30/19 0742  BP: 122/80 109/68 117/82 124/81  Pulse: 86 82 79 81  Resp: 12 (!) 26 16 15   Temp: 98.9 F (37.2 C) 98.5 F (36.9 C) 98.4 F (36.9 C)   TempSrc: Oral Oral Oral   SpO2: 92% 96% 96% 95%  Weight:  80 kg    Height:       Body mass index is 30.27 kg/m.  Physical Exam Constitutional:      Comments: Sitting upright in bed, smiling.   Cardiovascular:     Rate and Rhythm: Normal rate and regular rhythm.     Heart sounds: No murmur.  Pulmonary:     Comments: Room air. Breathing comfortably and conversing easily. Diminished posteriorly with reactive dry cough during deep inspiration Chest:       Comments: Diffuse swelling  that spans from outer quadrant in toward nipple. Firmness felt tracking along to nipple that is unchanged. Nipple remains inverted. Warm, slightly more erythema today. Tenderness along dependent/underside of breast when lifting to examine  Abdominal:     General: Bowel sounds are normal.     Tenderness: There is no abdominal tenderness.  Skin:    General: Skin is warm and dry.     Coloration: Skin is not pale.  Neurological:     Mental Status: She is alert and oriented to person, place, and time.     Lab Results Lab Results  Component Value Date   WBC 19.3 (H) 01/30/2019   HGB 9.0 (L) 01/30/2019   HCT 26.4 (L) 01/30/2019   MCV 78.1 (L) 01/30/2019   PLT 322 01/30/2019    Lab Results  Component Value Date   CREATININE 0.82  01/30/2019   BUN 5 (L) 01/30/2019   NA 138 01/30/2019   K 3.1 (L) 01/30/2019   CL 107 01/30/2019   CO2 23 01/30/2019    Lab Results  Component Value Date   ALT 33 01/30/2019   AST 31 01/30/2019   ALKPHOS 63 01/30/2019   BILITOT 0.6 01/30/2019     Microbiology: Recent Results (from the past 240 hour(s))  SARS Coronavirus 2 (CEPHEID- Performed in [facility]), Hosp Order     Status: None   Collection Time: 01/26/19  5:50 AM   Specimen: Nasopharyngeal Swab  Result Value Ref Range Status   SARS Coronavirus 2 NEGATIVE NEGATIVE Final    Comment: (NOTE) If result is NEGATIVE SARS-CoV-2 target nucleic acids are NOT DETECTED. The SARS-CoV-2 RNA is generally detectable in upper and lower  respiratory specimens during the acute phase of infection. The lowest  concentration of SARS-CoV-2 viral copies this assay can detect is 250  copies / mL. A negative result does not preclude SARS-CoV-2 infection  and should not be used as the sole basis for treatment or other  patient management decisions.  A negative result may occur with  improper specimen collection / handling, submission of specimen other  than nasopharyngeal swab, presence of viral mutation(s) within the  areas targeted by this assay, and inadequate number of viral copies  (<250 copies / mL). A negative result must be combined with clinical  observations, patient history, and epidemiological information. If result is POSITIVE SARS-CoV-2 target nucleic acids are DETECTED. The SARS-CoV-2 RNA is generally detectable in upper and lower  respiratory specimens dur ing the acute phase of infection.  Positive  results are indicative of active infection with SARS-CoV-2.  Clinical  correlation with patient history and other diagnostic information is  necessary to determine patient infection status.  Positive results do  not rule out bacterial infection or co-infection with other viruses. If result is PRESUMPTIVE POSTIVE  SARS-CoV-2 nucleic acids MAY BE PRESENT.   A presumptive positive result was obtained on the submitted specimen  and confirmed on repeat testing.  While 2019 novel coronavirus  (SARS-CoV-2) nucleic acids may be present in the submitted sample  additional confirmatory testing may be necessary for epidemiological  and / or clinical management purposes  to differentiate between  SARS-CoV-2 and other Sarbecovirus currently known to infect humans.  If clinically indicated additional testing with an alternate test  methodology [phone]([id number]) is advised. The SARS-CoV-2 RNA is generally  detectable in upper and lower respiratory sp ecimens during the acute  phase of infection. The expected result is Negative. Fact Sheet for Patients:  BoilerBrush.com.cyhttps://www.fda.gov/media/136312/download Fact  Sheet for Healthcare Providers: BankingDealers.co.za This test is not yet approved or cleared by the [place] and has been authorized for detection and/or diagnosis of SARS-CoV-2 by FDA under an Emergency Use Authorization (EUA).  This EUA will remain in effect (meaning this test can be used) for the duration of the COVID-19 declaration under Section 564(b)(1) of the Act, 21 U.S.C. section 360bbb-3(b)(1), unless the authorization is terminated or revoked sooner. Performed at [facility], [place] [address] [place], [place] [address]   Blood Culture (routine x 2)     Status: None (Preliminary result)   Collection Time: 01/26/19  6:20 AM   Specimen: BLOOD  Result Value Ref Range Status   Specimen Description BLOOD RIGHT ANTECUBITAL  Final   Special Requests   Final    BOTTLES DRAWN AEROBIC AND ANAEROBIC Blood Culture adequate volume   Culture   Final    NO GROWTH 3 DAYS Performed at [facility], [place] [address] [place], [place] [address]    Report Status PENDING  Incomplete  Blood Culture (routine x 2)     Status: None (Preliminary result)   Collection Time: 01/26/19  6:40  AM   Specimen: BLOOD RIGHT HAND  Result Value Ref Range Status   Specimen Description BLOOD RIGHT HAND  Final   Special Requests   Final    BOTTLES DRAWN AEROBIC AND ANAEROBIC Blood Culture adequate volume   Culture   Final    NO GROWTH 3 DAYS Performed at [facility], [place] [address] [place], [place] [address]    Report Status PENDING  Incomplete  Respiratory Panel by PCR     Status: None   Collection Time: 01/26/19  6:58 PM   Specimen: Nasopharyngeal Swab; Respiratory  Result Value Ref Range Status   Adenovirus NOT DETECTED NOT DETECTED Final   Coronavirus 229E NOT DETECTED NOT DETECTED Final    Comment: (NOTE) The Coronavirus on the Respiratory Panel, DOES NOT test for the novel  Coronavirus (2019 nCoV)    Coronavirus HKU1 NOT DETECTED NOT DETECTED Final   Coronavirus NL63 NOT DETECTED NOT DETECTED Final   Coronavirus OC43 NOT DETECTED NOT DETECTED Final   Metapneumovirus NOT DETECTED NOT DETECTED Final   Rhinovirus / Enterovirus NOT DETECTED NOT DETECTED Final   Influenza A NOT DETECTED NOT DETECTED Final   Influenza B NOT DETECTED NOT DETECTED Final   Parainfluenza Virus 1 NOT DETECTED NOT DETECTED Final   Parainfluenza Virus 2 NOT DETECTED NOT DETECTED Final   Parainfluenza Virus 3 NOT DETECTED NOT DETECTED Final   Parainfluenza Virus 4 NOT DETECTED NOT DETECTED Final   Respiratory Syncytial Virus NOT DETECTED NOT DETECTED Final   Bordetella pertussis NOT DETECTED NOT DETECTED Final   Chlamydophila pneumoniae NOT DETECTED NOT DETECTED Final   Mycoplasma pneumoniae NOT DETECTED NOT DETECTED Final    Comment: Performed at [facility], [place]. [address] [place], [place] [address]  Novel Coronavirus, NAA (hospital order; send-out to ref lab)     Status: None   Collection Time: 01/26/19  6:58 PM  Result Value Ref Range Status   SARS-CoV-2, NAA NOT DETECTED NOT DETECTED Final    Comment: (NOTE) This test was developed and its performance characteristics  determined by [employer]. This test has not been FDA cleared or approved. This test has been authorized by FDA under an Emergency Use Authorization (EUA). This test is only authorized for the duration of time the declaration that circumstances exist  justifying the authorization of the emergency use of in vitro diagnostic tests for detection of SARS-CoV-2 virus and/or diagnosis of COVID-19 infection under section 564(b)(1) of the Act, 21 U.S.C. 161WRU-0(A)(5360bbb-3(b)(1), unless the authorization is terminated or revoked sooner. When diagnostic testing is negative, the possibility of a false negative result should be considered in the context of a patient's recent exposures and the presence of clinical signs and symptoms consistent with COVID-19. An individual without symptoms of COVID-19 and who is not shedding SARS-CoV-2 virus would expect to have a negative (not detected) result in this assay. Performed  At: [facility] [address] [place], [place] [id number] Jolene SchimkeNagendra Sanjai MD NW:[id number]:[phone]    Coronavirus Source NASOPHARYNGEAL  Final    Comment: Performed at [facility], [address] [place], [place] [address]  MRSA PCR Screening     Status: None   Collection Time: 01/28/19 10:20 AM   Specimen: Nasal Mucosa; Nasopharyngeal  Result Value Ref Range Status   MRSA by PCR NEGATIVE NEGATIVE Final    Comment:        The GeneXpert MRSA Assay (FDA approved for NASAL specimens only), is one component of a comprehensive MRSA colonization surveillance program. It is not intended to diagnose MRSA infection nor to guide or monitor treatment for MRSA infections. Performed at [facility], [address] [place], [place] [address]      Rexene AlbertsStephanie , MSN, NP-C [facility]  [email or web address] [email or web address] Pager: [phone] Office: [phone] [facility] Main Line: [phone]   01/30/2019  10:19 AM

## 2019-01-31 DIAGNOSIS — J918 Pleural effusion in other conditions classified elsewhere: Secondary | ICD-10-CM

## 2019-01-31 DIAGNOSIS — H9319 Tinnitus, unspecified ear: Secondary | ICD-10-CM

## 2019-01-31 DIAGNOSIS — J9621 Acute and chronic respiratory failure with hypoxia: Secondary | ICD-10-CM

## 2019-01-31 LAB — CULTURE, BLOOD (ROUTINE X 2)
Culture: NO GROWTH
Culture: NO GROWTH
Special Requests: ADEQUATE
Special Requests: ADEQUATE

## 2019-01-31 LAB — BASIC METABOLIC PANEL
Anion gap: 12 (ref 5–15)
BUN: <5 mg/dL — ABNORMAL LOW (ref 6–20)
CO2: 24 mmol/L (ref 22–32)
Calcium: 6.9 mg/dL — ABNORMAL LOW (ref 8.9–10.3)
Chloride: 103 mmol/L (ref 98–111)
Creatinine, Ser: 0.75 mg/dL (ref 0.44–1.00)
GFR calc Af Amer: >60 mL/min (ref >60)
GFR calc non Af Amer: >60 mL/min (ref >60)
Glucose, Bld: 90 mg/dL (ref 70–99)
Potassium: 4.1 mmol/L (ref 3.5–5.1)
Sodium: 139 mmol/L (ref 135–145)

## 2019-01-31 LAB — MAGNESIUM: Magnesium: 1.1 mg/dL — ABNORMAL LOW (ref 1.7–2.4)

## 2019-01-31 LAB — CBC
HCT: 27.4 % — ABNORMAL LOW (ref 36.0–46.0)
Hemoglobin: 9.5 g/dL — ABNORMAL LOW (ref 12.0–15.0)
MCH: 26.8 pg (ref 26.0–34.0)
MCHC: 34.7 g/dL (ref 30.0–36.0)
MCV: 77.4 fL — ABNORMAL LOW (ref 80.0–100.0)
Platelets: 322 10*3/uL (ref 150–400)
RBC: 3.54 MIL/uL — ABNORMAL LOW (ref 3.87–5.11)
RDW: 15 % (ref 11.5–15.5)
WBC: 18.8 10*3/uL — ABNORMAL HIGH (ref 4.0–10.5)
nRBC: 0 % (ref 0.0–0.2)

## 2019-01-31 MED ORDER — MAGNESIUM SULFATE 4 GM/100ML IV SOLN
4.0000 g | Freq: Once | INTRAVENOUS | Status: AC
  Administered 2019-01-31: 4 g via INTRAVENOUS
  Filled 2019-01-31: qty 100

## 2019-01-31 MED ORDER — AMOXICILLIN-POT CLAVULANATE 250-62.5 MG/5ML PO SUSR: 875.0000 mg | Freq: Two times a day (BID) | ORAL | 0 refills | Status: AC

## 2019-01-31 MED ORDER — MAGNESIUM SULFATE 2 GM/50ML IV SOLN: 2.0000 g | Freq: Once | INTRAVENOUS | Status: DC

## 2019-01-31 NOTE — Discharge Summary (Addendum)
Physician Discharge Summary  Erica Williamson ZOX:[id number] DOB: [birth year] DOA: 01/26/2019  PCP: Patient, No Pcp Per  Admit date: 01/26/2019 Discharge date: 01/31/2019  Admitted From: Home Disposition:  Home  Recommendations for Outpatient Follow-up:  1. Follow up with PCP 02/20/2019 2. Follow up with ID 02/12/2019 3. Recommend outpatient follow-up for repeat CBC to ensure resolution of leukocytosis  Discharge Condition: Stable CODE STATUS: Full code Diet recommendation: Regular   Brief/Interim Summary: Erica Williamson a 28 y.o.femalewithoutsignificant past medical history; who presented breast discomfort and enlargement. Patient reported that she initially noted a lump on her left breast on June 24. She followed up with Dr.Galloway of OB/GYN 6 days later, and was started started on antibiotics. Treated with doxycycline and subsequently Augmentinwithout any improvement in symptoms. Associated symptoms included fevers and chills. Patient had been sent for ultrasound of the breast on 7/8, which revealed worsening infection and with no signs of Williamson abscess. [facility] she was admitted into the hospital at that time. Blood cultures were obtained along with placing the patient on empiric antibiotics of vancomycin and Rocephin for treatment of sepsis secondary to mastitis of the left breast. Vital signs were noted to be stable at that time. Admission lab work revealed WBC 20.4, hemoglobin 13.5, platelets 336, creatinine 0.46, and lactic acid 0.8. Despite antibiotics patient reportedly still continued to spike fever up 103 Fand showed worsening white blood cell count. Patient reportedly became more short of breath on around 17 2020.Labs on 7/10 revealed WBC 22, hemoglobin 10.2, creatinine 1.5, troponin negative, and d-dimer 1.98. CT scan of the chest with contrast was obtained showing signs of a multifocal  pneumonia and left mastitis without signs of fluid collection. Patient was noted to be hypotensive and temporarily placed on dopamine and was on nasal cannula oxygen. Patient was transferred prior to COVID-19 testing as there was a 24-hour turnaround time. Transfer was requested and patient had [facility]. 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 She really has not had a cough but has felt short of breath to the point which makes her cough. She complained of heaviness in her chest and pain with taking deep inspiratory breaths. Upon further questioning, she states that her father-in-law who was suffering from lung cancer died of pneumonia just 3 weeks ago and the lived in the same house. That is the only contact she has. There is no other family member sick with similar pneumonia or any respiratory symptoms. When she arrived here at emergency department, she was hypoxic requiring 4 L of nasal cannula with white cells of 23,000. Also hypokalemia with potassium of 2.8 and acute kidney kidney with creatinine of 1.19. She was tested negative for COVID and was admitted under hospitalist service and was placed on IV Rocephin, Zithromax and vancomycin as well as Solu-Medrol. General surgery and infectious disease were consulted. General surgery recommended no surgical intervention. Due to worsening shortness of breath however improving oxygenation, CT angiogram of the chest was done and she was ruled out of PE but this once again showed bilateral infiltrates.   She was eventually weaned off of oxygen.  Infectious disease recommended to complete course of Augmentin and follow-up in office.  On day of discharge, she was feeling much better, mastitis improving.  Discharge Diagnoses:  Principal Problem:   Mastitis Active Problems:   Acute on chronic respiratory failure with hypoxia (HCC)  HCAP (healthcare-associated pneumonia)   Transient  hypotension   AKI (acute kidney injury) (HCC)   Hypokalemia   Normocytic anemia   CAP (community acquired pneumonia)   Acute hypoxic respiratory failure and sepsis secondary to multifocal pneumonia: Appreciate infectious disease.  Weaned off oxygen now and on room air.  Complete course of Augmentin, 3 more days.  Sepsis secondary to left breast mastitis: She was evaluated by surgery and no further recommendations for intervention.  Blood cultures negative to date. Complete course of Augmentin, 3 more days.  Normocytic anemia:Hemoglobin is stable.   Ferritin within normal limit, low iron as well as low TIBC which is not consistent with iron deficiency anemia  Hypomagnesemia: Replace  Discharge Instructions  Discharge Instructions    Call MD for:  difficulty breathing, headache or visual disturbances   Complete by: As directed    Call MD for:  extreme fatigue   Complete by: As directed    Call MD for:  hives   Complete by: As directed    Call MD for:  persistant dizziness or light-headedness   Complete by: As directed    Call MD for:  persistant nausea and vomiting   Complete by: As directed    Call MD for:  severe uncontrolled pain   Complete by: As directed    Call MD for:  temperature >100.4   Complete by: As directed    Diet general   Complete by: As directed    Discharge instructions   Complete by: As directed    You were cared for by a hospitalist during your hospital stay. If you have any questions about your discharge medications or the care you received while you were in the hospital after you are discharged, you can call the unit and ask to speak with the hospitalist on call if the hospitalist that took care of you is not available. Once you are discharged, your primary care physician will handle any further medical issues. Please note that NO REFILLS for any discharge medications will be authorized once you are discharged, as it is imperative that you return to your  primary care physician (or establish a relationship with a primary care physician if you do not have one) for your aftercare needs so that they can reassess your need for medications and monitor your lab values.   Increase activity slowly   Complete by: As directed      Allergies as of 01/31/2019   No Known Allergies     Medication List    STOP taking these medications   amoxicillin-clavulanate 875-125 MG tablet Commonly known as: AUGMENTIN Replaced by: amoxicillin-clavulanate 250-62.5 MG/5ML suspension   doxycycline 100 MG capsule Commonly known as: VIBRAMYCIN     TAKE these medications   amoxicillin-clavulanate 250-62.5 MG/5ML suspension Commonly known as: AUGMENTIN Take 17.5 mLs (875 mg total) by mouth 2 (two) times daily for 3 days. Replaces: amoxicillin-clavulanate 875-125 MG tablet      Follow-up Information     [facility]. Go on 02/20/2019.   Why: 9:30 am, Cain Saupe MD Contact information: [address] [place] [address] [phone]       [facility] Follow up on 02/12/2019.   Specialty: Infectious Diseases Why: Virtual appointment with Rexene Alberts, NP at 11:30 scheduled. If you would rather Williamson in office visit please call to inform of your request. You will receive a link via text message shortly before your appointment. Contact information:  [address], [address] [id number] [place] [id number] [phone]         No Known Allergies  Consultations:  General surgery  Infectious disease   Procedures/Studies: Dg Chest 2 View  Result Date: 01/26/2019 CLINICAL DATA:  Community acquired pneumonia EXAM: CHEST - 2 VIEW COMPARISON:  01/25/2019 FINDINGS: Slight interval increase in small bilateral pleural effusions and associated bibasilar atelectasis or consolidation. No new airspace opacity. IMPRESSION: Slight interval increase in  small bilateral pleural effusions and associated bibasilar atelectasis or consolidation. No new airspace opacity. Electronically Signed   By: Lauralyn PrimesAlex  Bibbey M.D.   On: 01/26/2019 11:12   Ct Angio Chest Pe W Or Wo Contrast  Result Date: 01/28/2019 CLINICAL DATA:  Shortness of breath EXAM: CT ANGIOGRAPHY CHEST WITH CONTRAST TECHNIQUE: Multidetector CT imaging of the chest was performed using the standard protocol during bolus administration of intravenous contrast. Multiplanar CT image reconstructions and MIPs were obtained to evaluate the vascular anatomy. CONTRAST:  50mL OMNIPAQUE IOHEXOL 350 MG/ML SOLN COMPARISON:  Chest CT January 25, 2019; chest radiograph January 26, 2019 FINDINGS: Cardiovascular: There is no demonstrable pulmonary embolus. There is no appreciable thoracic aortic aneurysm or dissection. Visualized great vessels appear unremarkable. There is a small pericardial effusion which may be within physiologic range. The pericardium does not appear thickened. Mediastinum/Nodes: Visualized thyroid appears unremarkable. There is no appreciable thoracic adenopathy. No esophageal lesions are evident. Lungs/Pleura: There are moderate free-flowing pleural effusions bilaterally. There is consolidation in both lower lobes. There is mild atelectatic change in each upper lobe region. Upper Abdomen: Visualized upper abdominal structures appear unremarkable. Musculoskeletal: There remains extensive soft tissues/subcutaneous thickening throughout the left breast with diffuse edema within the left breast consistent with mastitis. No blastic or lytic bone lesions are evident. Review of the MIP images confirms the above findings. IMPRESSION: 1. No demonstrable pulmonary embolus. No thoracic aortic aneurysm or dissection. 2. Bilateral free-flowing pleural effusions. Extensive bilateral lower lobe airspace consolidation. There is mild upper lobe atelectatic change bilaterally. 3. Left breast edema and apparent cellulitis  consistent with mastitis. This finding has been noted previously. 4.  No appreciable adenopathy. 5. Small pericardial effusion which may be within physiologic range. Electronically Signed   By: Bretta BangWilliam  Woodruff III M.D.   On: 01/28/2019 14:18   Nm Pulmonary Perfusion  Result Date: 01/26/2019 CLINICAL DATA:  Respiratory distress and elevated D-dimer EXAM: NUCLEAR MEDICINE PERFUSION LUNG SCAN VIEWS: Anterior, posterior, left lateral, right lateral, RPO, LPO, RAO, LAO RADIOPHARMACEUTICALS:  1.5 mCi Tc-4310m MAA IV COMPARISON:  Chest radiograph January 26, 2019 FINDINGS: Radiotracer uptake is homogeneous and symmetric bilaterally. No perfusion defects are demonstrable. IMPRESSION: No appreciable perfusion defects. Very low probability of pulmonary embolus. Electronically Signed   By: Bretta BangWilliam  Woodruff III M.D.   On: 01/26/2019 15:33       Discharge Exam: Vitals:   01/30/19 2144 01/31/19 0525  BP: 126/85 122/84  Pulse: 74 86  Resp: 16 16  Temp: 98.7 F (37.1 C) 98.6 F (37 C)  SpO2: 95% 93%    General: Pt is alert, awake, not in acute distress Cardiovascular: RRR, S1/S2 +, no rubs, no gallops Respiratory: CTA bilaterally, no wheezing, no rhonchi Abdominal: Soft, NT, ND, bowel sounds + Extremities: no edema, no cyanosis    The results of significant diagnostics from this hospitalization (including imaging, microbiology, ancillary and laboratory) are listed below for reference.     Microbiology: Recent Results (from the past 240 hour(s))  SARS Coronavirus 2 (CEPHEID- Performed in  [facility] hospital lab), Hosp Order     Status: None   Collection Time: 01/26/19  5:50 AM   Specimen: Nasopharyngeal Swab  Result Value Ref Range Status   SARS Coronavirus 2 NEGATIVE NEGATIVE Final    Comment: (NOTE) If result is NEGATIVE SARS-CoV-2 target nucleic acids are NOT DETECTED. The SARS-CoV-2 RNA is generally detectable in upper and lower  respiratory specimens during the acute phase of infection.  The lowest  concentration of SARS-CoV-2 viral copies this assay can detect is 250  copies / mL. A negative result does not preclude SARS-CoV-2 infection  and should not be used as the sole basis for treatment or other  patient management decisions.  A negative result may occur with  improper specimen collection / handling, submission of specimen other  than nasopharyngeal swab, presence of viral mutation(s) within the  areas targeted by this assay, and inadequate number of viral copies  (<250 copies / mL). A negative result must be combined with clinical  observations, patient history, and epidemiological information. If result is POSITIVE SARS-CoV-2 target nucleic acids are DETECTED. The SARS-CoV-2 RNA is generally detectable in upper and lower  respiratory specimens dur ing the acute phase of infection.  Positive  results are indicative of active infection with SARS-CoV-2.  Clinical  correlation with patient history and other diagnostic information is  necessary to determine patient infection status.  Positive results do  not rule out bacterial infection or co-infection with other viruses. If result is PRESUMPTIVE POSTIVE SARS-CoV-2 nucleic acids MAY BE PRESENT.   A presumptive positive result was obtained on the submitted specimen  and confirmed on repeat testing.  While 2019 novel coronavirus  (SARS-CoV-2) nucleic acids may be present in the submitted sample  additional confirmatory testing may be necessary for epidemiological  and / or clinical management purposes  to differentiate between  SARS-CoV-2 and other Sarbecovirus currently known to infect humans.  If clinically indicated additional testing with Williamson alternate test  methodology [phone]([id number]) is advised. The SARS-CoV-2 RNA is generally  detectable in upper and lower respiratory sp ecimens during the acute  phase of infection. The expected result is Negative. Fact Sheet for Patients:   BoilerBrush.com.cyhttps://www.fda.gov/media/136312/download Fact Sheet for Healthcare Providers: https://pope.com/https://www.fda.gov/media/136313/download This test is not yet approved or cleared by the [place] FDA and has been authorized for detection and/or diagnosis of SARS-CoV-2 by FDA under Williamson Emergency Use Authorization (EUA).  This EUA will remain in effect (meaning this test can be used) for the duration of the COVID-19 declaration under Section 564(b)(1) of the Act, 21 U.S.C. section 360bbb-3(b)(1), unless the authorization is terminated or revoked sooner. Performed at [facility], [address] [place], [place] [address]   Blood Culture (routine x 2)     Status: None (Preliminary result)   Collection Time: 01/26/19  6:20 AM   Specimen: BLOOD  Result Value Ref Range Status   Specimen Description BLOOD RIGHT ANTECUBITAL  Final   Special Requests   Final    BOTTLES DRAWN AEROBIC AND ANAEROBIC Blood Culture adequate volume   Culture   Final    NO GROWTH 4 DAYS Performed at [facility], [address] [place], [place] [address]    Report Status PENDING  Incomplete  Blood Culture (routine x 2)     Status: None (Preliminary result)   Collection Time: 01/26/19  6:40 AM   Specimen: BLOOD RIGHT HAND  Result Value Ref Range Status   Specimen Description BLOOD RIGHT HAND  Final   Special Requests   Final    BOTTLES DRAWN AEROBIC AND ANAEROBIC Blood Culture adequate volume   Culture   Final    NO GROWTH 4 DAYS Performed at [facility], [address], [place], [place] [address]    Report Status PENDING  Incomplete  Respiratory Panel by PCR     Status: None   Collection Time: 01/26/19  6:58 PM   Specimen: Nasopharyngeal Swab; Respiratory  Result Value Ref Range Status   Adenovirus NOT DETECTED NOT DETECTED Final   Coronavirus 229E NOT DETECTED NOT DETECTED Final    Comment: (NOTE) The Coronavirus on the Respiratory Panel, DOES NOT test for the novel  Coronavirus (2019 nCoV)     Coronavirus HKU1 NOT DETECTED NOT DETECTED Final   Coronavirus NL63 NOT DETECTED NOT DETECTED Final   Coronavirus OC43 NOT DETECTED NOT DETECTED Final   Metapneumovirus NOT DETECTED NOT DETECTED Final   Rhinovirus / Enterovirus NOT DETECTED NOT DETECTED Final   Influenza A NOT DETECTED NOT DETECTED Final   Influenza B NOT DETECTED NOT DETECTED Final   Parainfluenza Virus 1 NOT DETECTED NOT DETECTED Final   Parainfluenza Virus 2 NOT DETECTED NOT DETECTED Final   Parainfluenza Virus 3 NOT DETECTED NOT DETECTED Final   Parainfluenza Virus 4 NOT DETECTED NOT DETECTED Final   Respiratory Syncytial Virus NOT DETECTED NOT DETECTED Final   Bordetella pertussis NOT DETECTED NOT DETECTED Final   Chlamydophila pneumoniae NOT DETECTED NOT DETECTED Final   Mycoplasma pneumoniae NOT DETECTED NOT DETECTED Final    Comment: Performed at [facility], [address], [place], [place] [address]  Novel Coronavirus, NAA (hospital order; send-out to ref lab)     Status: None   Collection Time: 01/26/19  6:58 PM  Result Value Ref Range Status   SARS-CoV-2, NAA NOT DETECTED NOT DETECTED Final    Comment: (NOTE) This test was developed and its performance characteristics determined by [employer]. This test has not been FDA cleared or approved. This test has been authorized by FDA under Williamson Emergency Use Authorization (EUA). This test is only authorized for the duration of time the declaration that circumstances exist justifying the authorization of the emergency use of in vitro diagnostic tests for detection of SARS-CoV-2 virus and/or diagnosis of COVID-19 infection under section 564(b)(1) of the Act, 21 U.S.C. 098JXB-1(Y)(7), unless the authorization is terminated or revoked sooner. When diagnostic testing is negative, the possibility of a false negative result should be considered in the context of a patient's recent exposures and the presence of clinical signs and symptoms consistent with  COVID-19. Williamson individual without symptoms of COVID-19 and who is not shedding SARS-CoV-2 virus would expect to have a negative (not detected) result in this assay. Performed  At: [facility] [address] [place], [place] [id number] Jolene Schimke MD QM:[id number]    Coronavirus Source NASOPHARYNGEAL  Final    Comment: Performed at [facility], [address] [place], [place] [address]  MRSA PCR Screening     Status: None   Collection Time: 01/28/19 10:20 AM   Specimen: Nasal Mucosa; Nasopharyngeal  Result Value Ref Range Status   MRSA by PCR NEGATIVE NEGATIVE Final    Comment:        The GeneXpert MRSA Assay (FDA approved for NASAL specimens only), is one component of a comprehensive MRSA colonization surveillance program. It is not intended to diagnose MRSA infection nor to guide or monitor treatment for MRSA infections.  Performed at [facility], [address] [place], [place] [address]      Labs: BNP (last 3 results) No results for input(s): BNP in the last 8760 hours. Basic Metabolic Panel: Recent Labs  Lab 01/26/19 1014 01/27/19 0305 01/28/19 1111 01/29/19 0414 01/30/19 0415 01/31/19 0428  NA  --  139 141 139 138 139  K  --  3.9 3.9 2.8* 3.1* 4.1  CL  --  112* 113* 110 107 103  CO2  --  20* 20* GLUCOSE  --  135* 145* 95 90 90  BUN  --  5* <5*  CREATININE  --  0.90 0.93 0.80 0.82 0.75  CALCIUM  --  7.6* 8.2* 7.2* 7.6* 6.9*  MG 1.6* 2.2  --   --   --  1.1*   Liver Function Tests: Recent Labs  Lab 01/26/19 0621 01/28/19 1111 01/29/19 0414 01/30/19 0415  AST ALT 33  ALKPHOS 98 66 53 63  BILITOT 1.0 0.4 0.5 0.6  PROT 5.5* 5.1* 4.4* 4.7*  ALBUMIN 2.0* 2.0* 1.7* 1.7*   No results for input(s): LIPASE, AMYLASE in the last 168 hours. No results for input(s): AMMONIA in the last 168 hours. CBC: Recent Labs  Lab 01/26/19 0621 01/27/19 0305 01/28/19 1111 01/29/19 0414 01/29/19 1201  01/30/19 0415 01/31/19 0428  WBC 23.4* 20.2* 24.5* 17.8*  --  19.3* 18.8*  NEUTROABS 18.5*  --  19.8* 12.1*  --  13.4*  --   HGB 10.0* 10.4* 9.6* 8.6*  --  9.0* 9.5*  HCT 30.2* 29.7* 27.4* 25.3* 26.4* 26.4* 27.4*  MCV 80.7 77.5* 76.3* 78.1*  --  78.1* 77.4*  PLT 269 313 360 322  --  322 322   Cardiac Enzymes: No results for input(s): CKTOTAL, CKMB, CKMBINDEX, TROPONINI in the last 168 hours. BNP: Invalid input(s): POCBNP CBG: Recent Labs  Lab 01/27/19 0339  GLUCAP 121*   D-Dimer No results for input(s): DDIMER in the last 72 hours. Hgb A1c No results for input(s): HGBA1C in the last 72 hours. Lipid Profile No results for input(s): CHOL, HDL, LDLCALC, TRIG, CHOLHDL, LDLDIRECT in the last 72 hours. Thyroid function studies No results for input(s): TSH, T4TOTAL, T3FREE, THYROIDAB in the last 72 hours.  Invalid input(s): FREET3 Anemia work up Recent Labs    01/29/19 1201  VITAMINB12 1,091*  TIBC 195*  IRON 12*  RETICCTPCT 1.5   Urinalysis No results found for: COLORURINE, APPEARANCEUR, LABSPEC, PHURINE, GLUCOSEU, HGBUR, BILIRUBINUR, KETONESUR, PROTEINUR, UROBILINOGEN, NITRITE, LEUKOCYTESUR Sepsis Labs Invalid input(s): PROCALCITONIN,  WBC,  LACTICIDVEN Microbiology Recent Results (from the past 240 hour(s))  SARS Coronavirus 2 (CEPHEID- Performed in [facility] hospital lab), Hosp Order     Status: None   Collection Time: 01/26/19  5:50 AM   Specimen: Nasopharyngeal Swab  Result Value Ref Range Status   SARS Coronavirus 2 NEGATIVE NEGATIVE Final    Comment: (NOTE) If result is NEGATIVE SARS-CoV-2 target nucleic acids are NOT DETECTED. The SARS-CoV-2 RNA is generally detectable in upper and lower  respiratory specimens during the acute phase of infection. The lowest  concentration of SARS-CoV-2 viral copies this assay can detect is 250  copies / mL. A negative result does not preclude SARS-CoV-2 infection  and should not be used as the sole basis for treatment or  other  patient management decisions.  A negative result may occur with  improper specimen collection / handling,  submission of specimen other  than nasopharyngeal swab, presence of viral mutation(s) within the  areas targeted by this assay, and inadequate number of viral copies  (<250 copies / mL). A negative result must be combined with clinical  observations, patient history, and epidemiological information. If result is POSITIVE SARS-CoV-2 target nucleic acids are DETECTED. The SARS-CoV-2 RNA is generally detectable in upper and lower  respiratory specimens dur ing the acute phase of infection.  Positive  results are indicative of active infection with SARS-CoV-2.  Clinical  correlation with patient history and other diagnostic information is  necessary to determine patient infection status.  Positive results do  not rule out bacterial infection or co-infection with other viruses. If result is PRESUMPTIVE POSTIVE SARS-CoV-2 nucleic acids MAY BE PRESENT.   A presumptive positive result was obtained on the submitted specimen  and confirmed on repeat testing.  While 2019 novel coronavirus  (SARS-CoV-2) nucleic acids may be present in the submitted sample  additional confirmatory testing may be necessary for epidemiological  and / or clinical management purposes  to differentiate between  SARS-CoV-2 and other Sarbecovirus currently known to infect humans.  If clinically indicated additional testing with Williamson alternate test  methodology [phone]) is advised. The SARS-CoV-2 RNA is generally  detectable in upper and lower respiratory sp ecimens during the acute  phase of infection. The expected result is Negative. Fact Sheet for Patients:  StrictlyIdeas.no Fact Sheet for Healthcare Providers: BankingDealers.co.za This test is not yet approved or cleared by the [place] FDA and has been authorized for detection and/or diagnosis of  SARS-CoV-2 by FDA under [place] Emergency Use Authorization (EUA).  This EUA will remain in effect (meaning this test can be used) for the duration of the COVID-19 declaration under Section 564(b)(1) of the Act, 21 U.S.C. section 360bbb-3(b)(1), unless the authorization is terminated or revoked sooner. Performed at [facility], [place] [address] [place], [place] [address]   Blood Culture (routine x 2)     Status: None (Preliminary result)   Collection Time: 01/26/19  6:20 AM   Specimen: BLOOD  Result Value Ref Range Status   Specimen Description BLOOD RIGHT ANTECUBITAL  Final   Special Requests   Final    BOTTLES DRAWN AEROBIC AND ANAEROBIC Blood Culture adequate volume   Culture   Final    NO GROWTH 4 DAYS Performed at [facility], [place] [address], [place], [place] [address]    Report Status PENDING  Incomplete  Blood Culture (routine x 2)     Status: None (Preliminary result)   Collection Time: 01/26/19  6:40 AM   Specimen: BLOOD RIGHT HAND  Result Value Ref Range Status   Specimen Description BLOOD RIGHT HAND  Final   Special Requests   Final    BOTTLES DRAWN AEROBIC AND ANAEROBIC Blood Culture adequate volume   Culture   Final    NO GROWTH 4 DAYS Performed at [facility], [place] [address], [place], [place] [address]    Report Status PENDING  Incomplete  Respiratory Panel by PCR     Status: None   Collection Time: 01/26/19  6:58 PM   Specimen: Nasopharyngeal Swab; Respiratory  Result Value Ref Range Status   Adenovirus NOT DETECTED NOT DETECTED Final   Coronavirus 229E NOT DETECTED NOT DETECTED Final    Comment: (NOTE) The Coronavirus on the Respiratory Panel, DOES NOT test for the novel  Coronavirus (2019 nCoV)    Coronavirus HKU1 NOT DETECTED NOT  DETECTED Final   Coronavirus NL63 NOT DETECTED NOT DETECTED Final   Coronavirus OC43 NOT DETECTED NOT DETECTED Final   Metapneumovirus NOT DETECTED NOT DETECTED Final   Rhinovirus / Enterovirus NOT DETECTED NOT  DETECTED Final   Influenza A NOT DETECTED NOT DETECTED Final   Influenza B NOT DETECTED NOT DETECTED Final   Parainfluenza Virus 1 NOT DETECTED NOT DETECTED Final   Parainfluenza Virus 2 NOT DETECTED NOT DETECTED Final   Parainfluenza Virus 3 NOT DETECTED NOT DETECTED Final   Parainfluenza Virus 4 NOT DETECTED NOT DETECTED Final   Respiratory Syncytial Virus NOT DETECTED NOT DETECTED Final   Bordetella pertussis NOT DETECTED NOT DETECTED Final   Chlamydophila pneumoniae NOT DETECTED NOT DETECTED Final   Mycoplasma pneumoniae NOT DETECTED NOT DETECTED Final    Comment: Performed at [facility], [address], [place], [place] [address]  Novel Coronavirus, NAA (hospital order; send-out to ref lab)     Status: None   Collection Time: 01/26/19  6:58 PM  Result Value Ref Range Status   SARS-CoV-2, NAA NOT DETECTED NOT DETECTED Final    Comment: (NOTE) This test was developed and its performance characteristics determined by [employer]. This test has not been FDA cleared or approved. This test has been authorized by FDA under [place] Emergency Use Authorization (EUA). This test is only authorized for the duration of time the declaration that circumstances exist justifying the authorization of the emergency use of in vitro diagnostic tests for detection of SARS-CoV-2 virus and/or diagnosis of COVID-19 infection under section 564(b)(1) of the Act, 21 U.S.C. 604VWU-9(W)(1), unless the authorization is terminated or revoked sooner. When diagnostic testing is negative, the possibility of a false negative result should be considered in the context of a patient's recent exposures and the presence of clinical signs and symptoms consistent with COVID-19. [place] individual without symptoms of COVID-19 and who is not shedding SARS-CoV-2 virus would expect to have a negative (not detected) result in this assay. Performed  At: [facility] [address] [place], [place]  [id number] Jolene Schimke MD AO:[id number]    Coronavirus Source NASOPHARYNGEAL  Final    Comment: Performed at [facility], [address], [place], [place] [address]  MRSA PCR Screening     Status: None   Collection Time: 01/28/19 10:20 AM   Specimen: Nasal Mucosa; Nasopharyngeal  Result Value Ref Range Status   MRSA by PCR NEGATIVE NEGATIVE Final    Comment:        The GeneXpert MRSA Assay (FDA approved for NASAL specimens only), is one component of a comprehensive MRSA colonization surveillance program. It is not intended to diagnose MRSA infection nor to guide or monitor treatment for MRSA infections. Performed at [facility], [address] [place], [place] [address]       Patient was seen and examined on the day of discharge and was found to be in stable condition. Time coordinating discharge: 25 minutes including assessment and coordination of care, as well as examination of the patient.   SIGNED:  Noralee Stain, DO [facility] [email or web address] 01/31/2019, 10:02 AM

## 2019-01-31 NOTE — Discharge Instructions (Signed)
Mastitis  Mastitis is irritation and swelling (inflammation) in an area of the breast. It is often caused by an infection that occurs when germs (bacteria) enter the skin. This most often happens to breastfeeding mothers, but it can happen to other women too as well as some men. Follow these instructions at home: Medicines  Take over-the-counter and prescription medicines only as told by your doctor.  If you were prescribed an antibiotic medicine, take it as told by your doctor. Do not stop taking it even if you start to feel better. General instructions  Do not wear a tight or underwire bra. Wear a soft support bra.  Drink more fluids, especially if you have a fever.  Get plenty of rest. If you are breastfeeding:   Keep emptying your breasts by breastfeeding or by using a breast pump.  Keep your nipples clean and dry.  During breastfeeding, empty the first breast before going to the other breast. Use a breast pump if your baby is not emptying your breasts.  Massage your breasts during feeding or pumping as told by your doctor.  If told, put moist heat on the affected area of your breast right before breastfeeding or pumping. Use the heat source that your doctor tells you to use.  If told, put ice on the affected area of your breast right after breastfeeding or pumping: ? Put ice in a plastic bag. ? Place a towel between your skin and the bag. ? Leave the ice on for 20 minutes.  If you go back to work, pump your breasts while at work.  Avoid letting your breasts get overly filled with milk (engorged). Contact a doctor if:  You have pus-like fluid leaking from your breast.  You have a fever.  Your symptoms do not get better within 2 days. Get help right away if:  Your pain and swelling are getting worse.  Your pain is not helped by medicine.  You have a red line going from your breast toward your armpit. Summary  Mastitis is irritation and swelling in an area of  the breast.  If you were prescribed an antibiotic medicine, do not stop taking it even if you start to feel better.  Drink more fluids and get plenty of rest.  Contact a doctor if your symptoms do not get better within 2 days. This information is not intended to replace advice given to you by your health care provider. Make sure you discuss any questions you have with your health care provider. Document Released: 06/22/2009 Document Revised: 06/16/2017 Document Reviewed: 07/26/2016 Elsevier Patient Education  2020 Elsevier Inc.  

## 2019-01-31 NOTE — Progress Notes (Signed)
[facility]  Date of Admission:  01/26/2019      Total days of antibiotics 9      Day 2 augmentin           ASSESSMENT: Erica HoardSonia has multifocal pneumonia with hypoxia that developed after treatment for left breast mastitis. COVID-19 negative x 3 on multiple platforms. RVP (-). HIV (-). PCT < 10 now. CTA (-) PE but with bilateral pulmonary effusions and multifocal pneumonia. Aspiration pneumonia/pneumonitis vs CAP. Notable improvement in hypoxia/breathing after steroids given.   Erica [place] did well overnight without any further oxygen requirement. She is ready to go home today. No further nausea/vomiting but has a lot of trouble swallowing pills - offered suggestion of liquid suspension for her and she loves that idea.   Regarding her mastitis very slow to resolve which is atypical. ?mismatch of original antibiotic and offending pathogen. Will continue 3 more days of PO Augmentin therapy to complete nearly 14 days. I will see her back virtually in ID clinic in 2 weeks to check in with her to ensure resolution and no further work up needed. She should have quick follow up care with her GYN as well. Of note she tells me her mother had breast cancer.    PLAN: 1. Please give augmentin suspension for 3 more days 2. Follow up in ID clinic with me in 2 weeks arranged     Principal Problem:   Mastitis Active Problems:   Acute on chronic respiratory failure with hypoxia (HCC)   HCAP (healthcare-associated pneumonia)   Transient hypotension   AKI (acute kidney injury) (HCC)   Hypokalemia   Normocytic anemia   CAP (community acquired pneumonia)    amoxicillin-clavulanate  1 tablet Oral Q12H   enoxaparin (LOVENOX) injection  40 mg Subcutaneous Q24H   guaiFENesin  600 mg Oral BID   pantoprazole  40 mg Oral BID AC   sodium chloride flush  10-40 mL Intracatheter Q12H   sodium chloride flush  3 mL Intravenous Q12H    SUBJECTIVE: Did well overnight  without requiring oxygen. Has been up walking in the halls and room more. No fevers. She feels her breathing is slowly improving.   Breast swelling better than yesterday and notes slow improvements. Having a lot of trouble swallowing augmentin d/t gag and in fact she dreads it.   MRSA PCR negative. HIV neg on admission.   Review of Systems: Review of Systems  HENT: Positive for tinnitus.   All other systems reviewed and are negative. See HPI for details  No Known Allergies  OBJECTIVE: Vitals:   01/30/19 0742 01/30/19 2144 01/31/19 0525 01/31/19 0533  BP: 124/81 126/85 122/84   Pulse: 81 74 86   Resp: 15 16 16    Temp:  98.7 F (37.1 C) 98.6 F (37 C)   TempSrc:  Oral Oral   SpO2: 95% 95% 93%   Weight:    75 kg  Height:       Body mass index is 28.38 kg/m.  Physical Exam Constitutional:      Comments: Sitting upright in bed, smiling.   Cardiovascular:     Rate and Rhythm: Normal rate and regular rhythm.     Heart sounds: No murmur.  Pulmonary:     Comments: Room air. Breathing comfortably and conversing easily. Diminished posteriorly with reactive dry cough during deep inspiration Chest:       Comments: Diffuse swelling that spans from outer quadrant in  toward nipple. Firmness felt tracking along to nipple that is unchanged. Nipple remains inverted. Warm, slightly more erythema today. Tenderness along dependent/underside of breast when lifting to examine  Abdominal:     General: Bowel sounds are normal.     Tenderness: There is no abdominal tenderness.  Skin:    General: Skin is warm and dry.     Coloration: Skin is not pale.  Neurological:     Mental Status: She is alert and oriented to person, place, and time.     Lab Results Lab Results  Component Value Date   WBC 18.8 (H) 01/31/2019   HGB 9.5 (L) 01/31/2019   HCT 27.4 (L) 01/31/2019   MCV 77.4 (L) 01/31/2019   PLT 322 01/31/2019    Lab Results  Component Value Date   CREATININE 0.75 01/31/2019   BUN  <5 (L) 01/31/2019   NA 139 01/31/2019   K 4.1 01/31/2019   CL 103 01/31/2019   CO2 24 01/31/2019    Lab Results  Component Value Date   ALT 33 01/30/2019   AST 31 01/30/2019   ALKPHOS 63 01/30/2019   BILITOT 0.6 01/30/2019     Microbiology: Recent Results (from the past 240 hour(s))  SARS Coronavirus 2 (CEPHEID- Performed in [place] hospital lab), Hosp Order     Status: None   Collection Time: 01/26/19  5:50 AM   Specimen: Nasopharyngeal Swab  Result Value Ref Range Status   SARS Coronavirus 2 NEGATIVE NEGATIVE Final    Comment: (NOTE) If result is NEGATIVE SARS-CoV-2 target nucleic acids are NOT DETECTED. The SARS-CoV-2 RNA is generally detectable in upper and lower  respiratory specimens during the acute phase of infection. The lowest  concentration of SARS-CoV-2 viral copies this assay can detect is 250  copies / mL. A negative result does not preclude SARS-CoV-2 infection  and should not be used as the sole basis for treatment or other  patient management decisions.  A negative result may occur with  improper specimen collection / handling, submission of specimen other  than nasopharyngeal swab, presence of viral mutation(s) within the  areas targeted by this assay, and inadequate number of viral copies  (<250 copies / mL). A negative result must be combined with clinical  observations, patient history, and epidemiological information. If result is POSITIVE SARS-CoV-2 target nucleic acids are DETECTED. The SARS-CoV-2 RNA is generally detectable in upper and lower  respiratory specimens dur ing the acute phase of infection.  Positive  results are indicative of active infection with SARS-CoV-2.  Clinical  correlation with patient history and other diagnostic information is  necessary to determine patient infection status.  Positive results do  not rule out bacterial infection or co-infection with other viruses. If result is PRESUMPTIVE POSTIVE SARS-CoV-2 nucleic  acids MAY BE PRESENT.   A presumptive positive result was obtained on the submitted specimen  and confirmed on repeat testing.  While 2019 novel coronavirus  (SARS-CoV-2) nucleic acids may be present in the submitted sample  additional confirmatory testing may be necessary for epidemiological  and / or clinical management purposes  to differentiate between  SARS-CoV-2 and other Sarbecovirus currently known to infect humans.  If clinically indicated additional testing with an alternate test  methodology [phone]) is advised. The SARS-CoV-2 RNA is generally  detectable in upper and lower respiratory sp ecimens during the acute  phase of infection. The expected result is Negative. Fact Sheet for Patients:  StrictlyIdeas.no Fact Sheet for Healthcare Providers: BankingDealers.co.za This test  is not yet approved or cleared by the [place] FDA and has been authorized for detection and/or diagnosis of SARS-CoV-2 by FDA under an Emergency Use Authorization (EUA).  This EUA will remain in effect (meaning this test can be used) for the duration of the COVID-19 declaration under Section 564(b)(1) of the Act, 21 U.S.C. section 360bbb-3(b)(1), unless the authorization is terminated or revoked sooner. Performed at [facility], [address] [place], [place] [address]   Blood Culture (routine x 2)     Status: None (Preliminary result)   Collection Time: 01/26/19  6:20 AM   Specimen: BLOOD  Result Value Ref Range Status   Specimen Description BLOOD RIGHT ANTECUBITAL  Final   Special Requests   Final    BOTTLES DRAWN AEROBIC AND ANAEROBIC Blood Culture adequate volume   Culture   Final    NO GROWTH 4 DAYS Performed at [facility], [address] [place], [place] [address]    Report Status PENDING  Incomplete  Blood Culture (routine x 2)     Status: None (Preliminary result)   Collection Time: 01/26/19  6:40 AM   Specimen: BLOOD  RIGHT HAND  Result Value Ref Range Status   Specimen Description BLOOD RIGHT HAND  Final   Special Requests   Final    BOTTLES DRAWN AEROBIC AND ANAEROBIC Blood Culture adequate volume   Culture   Final    NO GROWTH 4 DAYS Performed at [facility], [address] [place], [place] [address]    Report Status PENDING  Incomplete  Respiratory Panel by PCR     Status: None   Collection Time: 01/26/19  6:58 PM   Specimen: Nasopharyngeal Swab; Respiratory  Result Value Ref Range Status   Adenovirus NOT DETECTED NOT DETECTED Final   Coronavirus 229E NOT DETECTED NOT DETECTED Final    Comment: (NOTE) The Coronavirus on the Respiratory Panel, DOES NOT test for the novel  Coronavirus (2019 nCoV)    Coronavirus HKU1 NOT DETECTED NOT DETECTED Final   Coronavirus NL63 NOT DETECTED NOT DETECTED Final   Coronavirus OC43 NOT DETECTED NOT DETECTED Final   Metapneumovirus NOT DETECTED NOT DETECTED Final   Rhinovirus / Enterovirus NOT DETECTED NOT DETECTED Final   Influenza A NOT DETECTED NOT DETECTED Final   Influenza B NOT DETECTED NOT DETECTED Final   Parainfluenza Virus 1 NOT DETECTED NOT DETECTED Final   Parainfluenza Virus 2 NOT DETECTED NOT DETECTED Final   Parainfluenza Virus 3 NOT DETECTED NOT DETECTED Final   Parainfluenza Virus 4 NOT DETECTED NOT DETECTED Final   Respiratory Syncytial Virus NOT DETECTED NOT DETECTED Final   Bordetella pertussis NOT DETECTED NOT DETECTED Final   Chlamydophila pneumoniae NOT DETECTED NOT DETECTED Final   Mycoplasma pneumoniae NOT DETECTED NOT DETECTED Final    Comment: Performed at [facility], [address] [place], [place] [address]  Novel Coronavirus, NAA (hospital order; send-out to ref lab)     Status: None   Collection Time: 01/26/19  6:58 PM  Result Value Ref Range Status   SARS-CoV-2, NAA NOT DETECTED NOT DETECTED Final    Comment: (NOTE) This test was developed and its performance characteristics determined by [facility]. This test has not been FDA cleared or approved. This test has been authorized by FDA under an Emergency Use Authorization (EUA). This test is only authorized for the duration of time the declaration that circumstances exist justifying the authorization of the emergency use  of in vitro diagnostic tests for detection of SARS-CoV-2 virus and/or diagnosis of COVID-19 infection under section 564(b)(1) of the Act, 21 U.S.C. 161WRU-0(A)(5360bbb-3(b)(1), unless the authorization is terminated or revoked sooner. When diagnostic testing is negative, the possibility of a false negative result should be considered in the context of a patient's recent exposures and the presence of clinical signs and symptoms consistent with COVID-19. An individual without symptoms of COVID-19 and who is not shedding SARS-CoV-2 virus would expect to have a negative (not detected) result in this assay. Performed  At: [facility] [address] [place], [place] [id number] Jolene SchimkeNagendra Sanjai MD NW:[id number]:[phone]    Coronavirus Source NASOPHARYNGEAL  Final    Comment: Performed at [facility], [address] [place], [place] [address]  MRSA PCR Screening     Status: None   Collection Time: 01/28/19 10:20 AM   Specimen: Nasal Mucosa; Nasopharyngeal  Result Value Ref Range Status   MRSA by PCR NEGATIVE NEGATIVE Final    Comment:        The GeneXpert MRSA Assay (FDA approved for NASAL specimens only), is one component of a comprehensive MRSA colonization surveillance program. It is not intended to diagnose MRSA infection nor to guide or monitor treatment for MRSA infections. Performed at [facility], [address] [place], [place] [address]      Rexene AlbertsStephanie Katha Kuehne, MSN, NP-C [facility]  [email or web address] Pager: [phone] Office: [phone] [facility] Main Line: [phone]   01/31/2019  9:30 AM

## 2019-01-31 NOTE — Progress Notes (Signed)
Discharge instruction reviewed with patient, patient verbalized understanding using teach back. Patient IV removed and site is WNL. PT called her ride and he is  coming from [facility], patient requested to shower material provided to do so,  and belongings are packed.

## 2019-02-12 ENCOUNTER — Telehealth (INDEPENDENT_AMBULATORY_CARE_PROVIDER_SITE_OTHER): Payer: Self-pay | Admitting: Infectious Diseases

## 2019-02-12 ENCOUNTER — Encounter: Payer: Self-pay | Admitting: Infectious Diseases

## 2019-02-12 ENCOUNTER — Other Ambulatory Visit: Payer: Self-pay

## 2019-02-12 DIAGNOSIS — N61 Mastitis without abscess: Secondary | ICD-10-CM

## 2019-02-12 DIAGNOSIS — J189 Pneumonia, unspecified organism: Secondary | ICD-10-CM

## 2019-02-12 MED ORDER — AMOXICILLIN-POT CLAVULANATE 600-42.9 MG/5ML PO SUSR: 875.0000 mg | Freq: Two times a day (BID) | ORAL | 0 refills | Status: DC

## 2019-02-12 NOTE — Assessment & Plan Note (Deleted)
Clinically she appears significantly improved from a pneumonia standpoint.  Her pro calcitonin level had resolved prior to leaving the hospital.  It is possible she had a viral etiology or pneumonitis related to aspiration in the setting of vomiting.  She has a very profound and sensitive gag reflex and cannot swallow whole pills.   

## 2019-02-12 NOTE — Assessment & Plan Note (Signed)
Clinically she appears significantly improved from a pneumonia standpoint.  Her pro calcitonin level had resolved prior to leaving the hospital.  It is possible she had a viral etiology or pneumonitis related to aspiration in the setting of vomiting.  She has a very profound and sensitive gag reflex and cannot swallow whole pills.

## 2019-02-12 NOTE — Progress Notes (Signed)
Name: Erica PippinsSonia Sadaf Williamson Date of Birth: 12-09-1990 MRN: [id number]  PCP: Patient, No Pcp Per     Virtual Visit via Video Note I connected with Erica Williamson on 02/12/19 at 11:30 AM EDT by a video enabled telemedicine application and verified that I am speaking with the correct person using two identifiers.  Location: Patient: home Provider: [facility] Office    I discussed the limitations of evaluation and management by telemedicine and the availability of in person appointments. The patient expressed understanding and agreed to proceed.   Patient Active Problem List   Diagnosis Date Noted  . CAP (community acquired pneumonia)   . Mastitis 01/26/2019  . Transient hypotension 01/26/2019  . AKI (acute kidney injury) (HCC) 01/26/2019  . Normocytic anemia 01/26/2019    History of Present Illness: I connected with Erica Williamson to check in after her hospitalization for community-acquired pneumonia and mastitis of the left breast.  She was hospitalized in early July for mastitis of the left breast that failed treatment with oral doxycycline alone.  She was given combination therapy with doxycycline and Augmentin for 2 days however her fevers progressed so did the nausea which warranted inpatient admission as well as intravenous antibiotics.  She continued to have periodic high fevers and developed multifocal pneumonia on hospital day 3 with hypoxia that prompted [facility].  She however she tested negative with nasopharyngeal swabs on multiple platforms x3.    Erica Williamson was treated with IV ceftriaxone, vancomycin for 7 days and azithromycin for 3 days.  She was transitioned to Augmentin to complete a two-week course of treatment total.  She says she did very well with the Augmentin and had no trouble with side effects and appreciated the liquid suspension to swallow.  She said her fevers chills and night sweats  and pain, redness completely resolved up until Saturday 7/25.  She noticed last night that her sweating has returned as well.  She still does not feel quite herself and very fatigued.  Her breathing has resolved and is nearly back to baseline.  She is got no further cough or shortness of breath.  She is scheduled for a breast ultrasound tomorrow.    Observations/Objective: Constitutional: Erica Williamson appears tired but not acutely ill today.  She is smiling and able to converse easily. Skin: painful area at the base of the left breast with return of some swelling/erythema Pulmonary: she is able to breathe easily during conversation without any shortness of breath. No cough observed during our encounter.    Assessment and Plan: Problem List Items Addressed This Visit      Unprioritized   Mastitis    Erica Williamson tells me that she had nearly a complete resolution of the mastitis involving her left breast while completing Augmentin, however 7 days after completing antibiotics it seems that her infection has returned. I explained to her that this is quite unusual. One explaination could be that we have not provided adequate duration of therapy to cover anaerobes.  Given her return of night sweats and symptoms after her ultrasound I will have her resume her Augmentin and treat for 2 weeks with follow up in 3 weeks again.  Fortunately she is set up for a breast ultrasound to repeat tomorrow.  I will be on the look out for these results and asked her to please call me should they have any findings that warrant aspiration.  CAP (community acquired pneumonia)    Clinically she appears significantly improved from a pneumonia standpoint.  Her pro calcitonin level had resolved prior to leaving the hospital.  It is possible she had a viral etiology or pneumonitis related to aspiration in the setting of vomiting.  She has a very profound and sensitive gag reflex and cannot swallow whole pills.        RESOLVED:  HCAP (healthcare-associated pneumonia)       Follow Up Instructions: Start augmentin after tomorrow's breast ultrasound BID x 14d.  Follow up in Clinic virtually x 3 weeks.    I discussed the assessment and treatment plan with the patient. The patient was provided an opportunity to ask questions and all were answered. The patient agreed with the plan and demonstrated an understanding of the instructions.   The patient was advised to call back or seek an in-person evaluation if the symptoms worsen or if the condition fails to improve as anticipated.  I provided 15 minutes of non-face-to-face time during this encounter.   Janene Madeira, MSN, NP-C [facility] for Infectious Disease [email or web address] Pager: [phone] Office: [place]: [phone]

## 2019-02-12 NOTE — Assessment & Plan Note (Signed)
Erica Williamson tells me that she had nearly a complete resolution of the mastitis involving her left breast while completing Augmentin, however 7 days after completing antibiotics it seems that her infection has returned. I explained to her that this is quite unusual. One explaination could be that we have not provided adequate duration of therapy to cover anaerobes.  Given her return of night sweats and symptoms after her ultrasound I will have her resume her Augmentin and treat for 2 weeks with follow up in 3 weeks again.  Fortunately she is set up for a breast ultrasound to repeat tomorrow.  I will be on the look out for these results and asked her to please call me should they have any findings that warrant aspiration.

## 2019-02-13 ENCOUNTER — Telehealth: Payer: Self-pay | Admitting: *Deleted

## 2019-02-13 NOTE — Telephone Encounter (Addendum)
Patient and husband called to report that she is going to have a biopsy and they were told not to start antibiotic until after the biopsy. She wants to know exactly when to start the medication. Advised her will ask the provider and give her a call back.  Next day or after results come back.

## 2019-02-13 NOTE — Telephone Encounter (Signed)
I received a call from Dr. Kathie Dike from [facility] oncology radiology.  She wanted to let me know that Erica Williamson was still having fevers and wanted to ensure that she has not antibiotic to start taking as her biopsy has been completed.  She tells me that she took 2 spots of breast tissue as well as a lymph node on the left today.  There was no mass or organized process seen on ultrasound.  She tells me that it is a very strange appearance of the breast tissue and she does not suspect a malignant process.  I have asked her to please add in addition to conventional aerobic anaerobic culture to see if she can request AFB smear and culture.  She is native to [place] and concern for tuberculosis mastitis given atypical resolution for appropriate antibiotics is on my differential.  I have requested for any pathology and microbiologic data to be forwarded to me in our clinic for review.  Jere will begin taking her Augmentin now.  She has follow-up with me again in 3 weeks.  She will call the clinic to report any further fevers or changes in the meantime.

## 2019-02-13 NOTE — Telephone Encounter (Signed)
Excellent I am glad - her whole case is very weird and unexpected.  She can restart the augmentin as soon as she has her biopsy but not before. Next day is fine.   Can we find out where she is having her biopsy for? I would like for them to add Acid Fast Bacilli smear/culture also (AFB). Given she is from [place] I have a small suspicion that this could be due to one of these bacteria since she totally didn't respond like I hoped .   Can we call to make the recommendation? Happy for you to pass along my cell phone so I can speak with them directly.

## 2019-02-14 ENCOUNTER — Other Ambulatory Visit: Payer: Self-pay

## 2019-02-14 ENCOUNTER — Encounter (HOSPITAL_COMMUNITY): Payer: Self-pay | Admitting: *Deleted

## 2019-02-14 ENCOUNTER — Emergency Department (HOSPITAL_COMMUNITY): Payer: Self-pay

## 2019-02-14 ENCOUNTER — Other Ambulatory Visit: Payer: Self-pay | Admitting: Infectious Diseases

## 2019-02-14 ENCOUNTER — Telehealth: Payer: Self-pay | Admitting: Infectious Diseases

## 2019-02-14 ENCOUNTER — Inpatient Hospital Stay (HOSPITAL_COMMUNITY)
Admission: EM | Admit: 2019-02-14 | Discharge: 2019-02-19 | DRG: 601 | Disposition: A | Discharge status: Home / Self Care | Payer: Self-pay | Attending: Internal Medicine | Admitting: Internal Medicine

## 2019-02-14 ENCOUNTER — Telehealth: Payer: Self-pay

## 2019-02-14 DIAGNOSIS — L52 Erythema nodosum: Secondary | ICD-10-CM | POA: Diagnosis present

## 2019-02-14 DIAGNOSIS — R6510 Systemic inflammatory response syndrome (SIRS) of non-infectious origin without acute organ dysfunction: Secondary | ICD-10-CM

## 2019-02-14 DIAGNOSIS — N61 Mastitis without abscess: Secondary | ICD-10-CM | POA: Diagnosis present

## 2019-02-14 DIAGNOSIS — Z8701 Personal history of pneumonia (recurrent): Secondary | ICD-10-CM

## 2019-02-14 DIAGNOSIS — R739 Hyperglycemia, unspecified: Secondary | ICD-10-CM | POA: Diagnosis present

## 2019-02-14 DIAGNOSIS — R110 Nausea: Secondary | ICD-10-CM | POA: Diagnosis present

## 2019-02-14 DIAGNOSIS — E869 Volume depletion, unspecified: Secondary | ICD-10-CM | POA: Diagnosis present

## 2019-02-14 DIAGNOSIS — R112 Nausea with vomiting, unspecified: Secondary | ICD-10-CM | POA: Diagnosis present

## 2019-02-14 DIAGNOSIS — R7989 Other specified abnormal findings of blood chemistry: Secondary | ICD-10-CM | POA: Diagnosis present

## 2019-02-14 DIAGNOSIS — M2550 Pain in unspecified joint: Secondary | ICD-10-CM | POA: Diagnosis present

## 2019-02-14 DIAGNOSIS — D649 Anemia, unspecified: Secondary | ICD-10-CM | POA: Diagnosis present

## 2019-02-14 DIAGNOSIS — R000 Tachycardia, unspecified: Secondary | ICD-10-CM | POA: Diagnosis present

## 2019-02-14 DIAGNOSIS — N6042 Mammary duct ectasia of left breast: Principal | ICD-10-CM | POA: Diagnosis present

## 2019-02-14 DIAGNOSIS — T380X5A Adverse effect of glucocorticoids and synthetic analogues, initial encounter: Secondary | ICD-10-CM | POA: Diagnosis present

## 2019-02-14 DIAGNOSIS — Z20828 Contact with and (suspected) exposure to other viral communicable diseases: Secondary | ICD-10-CM | POA: Diagnosis present

## 2019-02-14 LAB — CBC WITH DIFFERENTIAL/PLATELET
Abs Immature Granulocytes: 0.07 10*3/uL (ref 0.00–0.07)
Basophils Absolute: 0.1 10*3/uL (ref 0.0–0.1)
Basophils Relative: 1 %
Eosinophils Absolute: 0.1 10*3/uL (ref 0.0–0.5)
Eosinophils Relative: 0 %
HCT: 36 % (ref 36.0–46.0)
Hemoglobin: 11.6 g/dL — ABNORMAL LOW (ref 12.0–15.0)
Immature Granulocytes: 0 %
Lymphocytes Relative: 11 %
Lymphs Abs: 1.9 10*3/uL (ref 0.7–4.0)
MCH: 26.3 pg (ref 26.0–34.0)
MCHC: 32.2 g/dL (ref 30.0–36.0)
MCV: 81.6 fL (ref 80.0–100.0)
Monocytes Absolute: 1.2 10*3/uL — ABNORMAL HIGH (ref 0.1–1.0)
Monocytes Relative: 7 %
Neutro Abs: 13.6 10*3/uL — ABNORMAL HIGH (ref 1.7–7.7)
Neutrophils Relative %: 81 %
Platelets: 389 10*3/uL (ref 150–400)
RBC: 4.41 MIL/uL (ref 3.87–5.11)
RDW: 15.8 % — ABNORMAL HIGH (ref 11.5–15.5)
WBC: 16.9 10*3/uL — ABNORMAL HIGH (ref 4.0–10.5)
nRBC: 0 % (ref 0.0–0.2)

## 2019-02-14 LAB — COMPREHENSIVE METABOLIC PANEL
ALT: 71 U/L — ABNORMAL HIGH (ref 0–44)
AST: 47 U/L — ABNORMAL HIGH (ref 15–41)
Albumin: 3.5 g/dL (ref 3.5–5.0)
Alkaline Phosphatase: 101 U/L (ref 38–126)
Anion gap: 14 (ref 5–15)
BUN: 6 mg/dL (ref 6–20)
CO2: 20 mmol/L — ABNORMAL LOW (ref 22–32)
Calcium: 9.4 mg/dL (ref 8.9–10.3)
Chloride: 100 mmol/L (ref 98–111)
Creatinine, Ser: 0.62 mg/dL (ref 0.44–1.00)
GFR calc Af Amer: >60 mL/min (ref >60)
GFR calc non Af Amer: >60 mL/min (ref >60)
Glucose, Bld: 103 mg/dL — ABNORMAL HIGH (ref 70–99)
Potassium: 3.5 mmol/L (ref 3.5–5.1)
Sodium: 134 mmol/L — ABNORMAL LOW (ref 135–145)
Total Bilirubin: 1.4 mg/dL — ABNORMAL HIGH (ref 0.3–1.2)
Total Protein: 8.4 g/dL — ABNORMAL HIGH (ref 6.5–8.1)

## 2019-02-14 LAB — LACTIC ACID, PLASMA
Lactic Acid, Venous: 1 mmol/L (ref 0.5–1.9)
Lactic Acid, Venous: 1.4 mmol/L (ref 0.5–1.9)

## 2019-02-14 LAB — I-STAT BETA HCG BLOOD, ED (MC, WL, AP ONLY): I-stat hCG, quantitative: <5 m[IU]/mL (ref <5)

## 2019-02-14 MED ORDER — SODIUM CHLORIDE 0.9% FLUSH: 3.0000 mL | Freq: Once | INTRAVENOUS | Status: DC

## 2019-02-14 MED ORDER — ONDANSETRON HCL 4 MG/2ML IJ SOLN
4.0000 mg | Freq: Once | INTRAMUSCULAR | Status: AC
  Administered 2019-02-14: 23:00:00 4 mg via INTRAVENOUS
  Filled 2019-02-14: qty 2

## 2019-02-14 MED ORDER — SODIUM CHLORIDE 0.9 % IV BOLUS
1000.0000 mL | Freq: Once | INTRAVENOUS | Status: AC
  Administered 2019-02-14: 1000 mL via INTRAVENOUS

## 2019-02-14 MED ORDER — ONDANSETRON 4 MG PO TBDP: 4.0000 mg | ORAL_TABLET | Freq: Three times a day (TID) | ORAL | 0 refills | Status: DC | PRN

## 2019-02-14 NOTE — ED Notes (Signed)
Pt stated she cannot provide urine sample at this time.  

## 2019-02-14 NOTE — Telephone Encounter (Addendum)
Call received from radiology team in [facility] regarding breast pathology obtained yesterday.  Histopathology suggests plasma cell mastitis, which is a benign, chronic inflammatory condition of the breast that often requires surgical resection due to relapse.  This can often be mistaken for inflammatory breast cancer but she reviewed with the pathologist who is fairly certain of the diagnosis. Consideration for TB mastitis, given granulomatous appearance, is still to be considered given fevers and AFB smear/culture is pending. 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  There is also discussion about addition of prednisone to her antibiotic course to assist with inflammatory nature of the condition. Will call her tomorrow to see how she is feeling and consider adding this.   Will plan on referral to breast surgeon for evaluation given refractory symptoms.   I understand she is in the emergency room now for nausea vomiting and pain at the biopsy site. Her chest xray in the ER reveals completely resolved infiltrates as seen previously during hospitalization.    Erica Madeira, MSN, NP-C [facility] for Infectious Disease [email or web address] Pager: [phone] Office: [phone] [place]: [phone]

## 2019-02-14 NOTE — Telephone Encounter (Signed)
Patient's husband is calling to say Erica Williamson seems to be getting worse.  She is vomiting every 2 hours with increased pain and fever.  He would like to know what to do.  Patient was advised to go to the ED for evaluation. Her husband stated she may be reluctant to go to ED since they have out of state relatives visiting. They may go on tomorrow.    Patient advised to promote hydration by taking sips of water or juice, fluids throughout the day . Ice sickles if available and continue to monitor symptoms .  If they worsen an ED evaluation is most defiantly needed.    Colletta Maryland will send Zofran to pharmacy, [facility] in [place] .

## 2019-02-14 NOTE — ED Triage Notes (Signed)
Pt in c/o fever and worsening pain to L breast, reports having a biopsy yesterday, pt has a reddened area to the inner L breast, pt reports taking all of her oral medications after discharge from the hospital, A&O x4

## 2019-02-14 NOTE — ED Provider Notes (Signed)
[place] EMERGENCY DEPARTMENT Provider Note   CSN: [id number] Arrival date & time: 02/14/19  1309     History   Chief Complaint Chief Complaint  Patient presents with  . Wound Infection    HPI Erica Williamson is a 28 y.o. female who presents with L breast pain. PMH significant for recent diagnosis of plasma cell mastitis, recent CAP. She is accompanied by her husband. She states that this has been a recurrent problem for the past month. She started to develop redness, swelling, pain of the L breast in June. She was admitted on 7/11 since she was not improving on oral antibiotics and developed CAP. She was ruled out for COVID. ID and general surgery was consulted but medical management was recommended. She was given Ceftriaxone and Solu-medrol and improved and was discharged on Augmentin. Over the past 3-4 days she has been worsening again. She reports fevers up to 101 at home. She has been coughing a little bit but the SOB has been improving. She has been vomiting every day and although she was prescribed Augmentin she was has not been able to keep this down. She endorses a headache and no appetite. She denies abdominal pain, diarrhea, urinary symptoms. Her biopsy came back remarkable for a plasma cell mastitis which is apparently a benign chronic inflammatory condition. Her AFB smear/cultures are pending.   HPI  History reviewed. No pertinent past medical history.  Patient Active Problem List   Diagnosis Date Noted  . CAP (community acquired pneumonia)   . Mastitis 01/26/2019  . Transient hypotension 01/26/2019  . AKI (acute kidney injury) ([place]) 01/26/2019  . Normocytic anemia 01/26/2019    Past Surgical History:  Procedure Laterality Date  . CESAREAN SECTION     X2     OB History    Gravida  1   Para      Term      Preterm      AB      Living        SAB      TAB      Ectopic      Multiple      Live Births                Home Medications    Prior to Admission medications   Medication Sig Start Date End Date Taking? Authorizing Provider  amoxicillin-clavulanate (AUGMENTIN) 600-42.9 MG/5ML suspension Take 7.3 mLs (875 mg total) by mouth 2 (two) times daily. 02/13/19   [place] Callas, NP  ondansetron (ZOFRAN ODT) 4 MG disintegrating tablet Take 1 tablet (4 mg total) by mouth every 8 (eight) hours as needed for nausea or vomiting. 02/14/19   [place] Callas, NP    Family History No family history on file.  Social History Social History   Tobacco Use  . Smoking status: Never Smoker  . Smokeless tobacco: Never Used  Substance Use Topics  . Alcohol use: Never    Frequency: Never  . Drug use: Never     Allergies   Patient has no known allergies.   Review of Systems Review of Systems  Constitutional: Positive for activity change, appetite change, fatigue and fever.  Respiratory: Positive for cough. Negative for shortness of breath.   Cardiovascular: Negative for chest pain.  Gastrointestinal: Positive for nausea and vomiting. Negative for abdominal pain and diarrhea.  Genitourinary: Negative for dysuria.  Neurological: Positive for headaches.  All other systems reviewed and are negative.  Physical Exam Updated Vital Signs BP 116/77   Pulse (!) 128   Temp 98.8 F (37.1 C) (Oral)   Resp 16   Ht 5\' 4"  (1.626 m)   Wt 63.5 kg   LMP 02/13/2019 (Approximate)   SpO2 99%   BMI 24.03 kg/m   Physical Exam Vitals signs and nursing note reviewed.  Constitutional:      General: She is not in acute distress.    Appearance: Normal appearance. She is well-developed. She is ill-appearing.  HENT:     Head: Normocephalic and atraumatic.  Eyes:     General: No scleral icterus.       Right eye: No discharge.        Left eye: No discharge.     Conjunctiva/sclera: Conjunctivae normal.     Pupils: Pupils are equal, round, and reactive to light.  Neck:     Musculoskeletal: Normal range of  motion.  Cardiovascular:     Rate and Rhythm: Regular rhythm. Tachycardia present.  Pulmonary:     Effort: Pulmonary effort is normal. No respiratory distress.     Breath sounds: Normal breath sounds.  Abdominal:     General: There is no distension.     Palpations: Abdomen is soft.     Tenderness: There is no abdominal tenderness.  Skin:    General: Skin is warm and dry.     Comments: L breast: There is diffuse swelling. There is redness over the medial breast, upper breast, and axilla. Biopsy sites are healing  Neurological:     Mental Status: She is alert and oriented to person, place, and time.  Psychiatric:        Behavior: Behavior normal.      ED Treatments / Results  Labs (all labs ordered are listed, but only abnormal results are displayed) Labs Reviewed  COMPREHENSIVE METABOLIC PANEL - Abnormal; Notable for the following components:      Result Value   Sodium 134 (*)    CO2 20 (*)    Glucose, Bld 103 (*)    Total Protein 8.4 (*)    AST 47 (*)    ALT 71 (*)    Total Bilirubin 1.4 (*)    All other components within normal limits  CBC WITH DIFFERENTIAL/PLATELET - Abnormal; Notable for the following components:   WBC 16.9 (*)    Hemoglobin 11.6 (*)    RDW 15.8 (*)    Neutro Abs 13.6 (*)    Monocytes Absolute 1.2 (*)    All other components within normal limits  URINALYSIS, ROUTINE W REFLEX MICROSCOPIC - Abnormal; Notable for the following components:   Hgb urine dipstick LARGE (*)    Ketones, ur 20 (*)    Protein, ur 30 (*)    Leukocytes,Ua TRACE (*)    RBC / HPF >50 (*)    All other components within normal limits  CBC - Abnormal; Notable for the following components:   WBC 11.6 (*)    RBC 3.68 (*)    Hemoglobin 9.7 (*)    HCT 30.6 (*)    RDW 15.8 (*)    All other components within normal limits  BASIC METABOLIC PANEL - Abnormal; Notable for the following components:   Calcium 8.3 (*)    All other components within normal limits  HEPATIC FUNCTION PANEL  - Abnormal; Notable for the following components:   Albumin 2.7 (*)    ALT 49 (*)    All other components within normal limits  SEDIMENTATION  RATE - Abnormal; Notable for the following components:   Sed Rate 109 (*)    All other components within normal limits  C-REACTIVE PROTEIN - Abnormal; Notable for the following components:   CRP 16.5 (*)    All other components within normal limits  SARS CORONAVIRUS 2 (HOSPITAL ORDER, PERFORMED IN [facility])  CULTURE, BLOOD (ROUTINE X 2)  CULTURE, BLOOD (ROUTINE X 2)  LACTIC ACID, PLASMA  LACTIC ACID, PLASMA  MAGNESIUM  PHOSPHORUS  PROCALCITONIN  I-STAT BETA HCG BLOOD, ED ([facility], [facility], [facility] ONLY)    EKG None  Radiology Dg Chest 2 View  Result Date: 02/14/2019 CLINICAL DATA:  Fever.  Vomiting. EXAM: CHEST - 2 VIEW COMPARISON:  01/26/2019 FINDINGS: The heart size and mediastinal contours are within normal limits. Both lungs are clear. The visualized skeletal structures are unremarkable. IMPRESSION: Normal exam.  The clearing of the infiltrates since the prior study. Electronically Signed   By: Francene BoyersJames  Maxwell M.D.   On: 02/14/2019 14:37    Procedures Procedures (including critical care time)  Medications Ordered in ED Medications  sodium chloride flush (NS) 0.9 % injection 3 mL (0 mLs Intravenous Hold 02/14/19 2300)  enoxaparin (LOVENOX) injection 40 mg ( Subcutaneous Canceled Entry 02/15/19 1800)  0.9 % NaCl with KCl 20 mEq/ L  infusion ( Intravenous New Bag/Given 02/15/19 0555)  ondansetron (ZOFRAN) injection 4 mg (4 mg Intravenous Given 02/15/19 1709)  morphine 2 MG/ML injection 1 mg (1 mg Intravenous Given 02/15/19 1709)  sodium chloride 0.9 % bolus 1,000 mL (0 mLs Intravenous Stopped 02/15/19 0216)  ondansetron (ZOFRAN) injection 4 mg (4 mg Intravenous Given 02/14/19 2256)  sodium chloride 0.9 % bolus 1,000 mL (1,000 mLs Intravenous New Bag/Given 02/15/19 0122)  magnesium sulfate IVPB 2 g 50 mL (2 g Intravenous New Bag/Given 02/15/19  0912)  sodium chloride 0.9 % bolus 500 mL (500 mLs Intravenous New Bag/Given 02/15/19 0909)     Initial Impression / Assessment and Plan / ED Course  I have reviewed the triage vital signs and the nursing notes.  Pertinent labs & imaging results that were available during my care of the patient were reviewed by me and considered in my medical decision making (see chart for details).  28 year old female presents with L breast pain, N/V. She has a recent diagnosis of plasma cell mastitis. She has been running fevers and not able to hold her medicines down. On exam she is tachycardic. Lungs are CTA. Abdomen is soft, non-tender. She is fatigued appearing. L breast is swollen and erythematous in three areas which have been biopsied. Her CBC is remarkable for leukocytosis of 16. CMP is remarkable for mild hyponatremia (134), slight transaminitis. UA has 20 ketones and >50 RBC. No evidence of UTI. Lactate is normal. Blood cultures were obtained.  After fluids and Zofran, her HR is not improved. She was given IV abx and I discussed with Dr. Dartha Lodgegbata who will admit.  Final Clinical Impressions(s) / ED Diagnoses   Final diagnoses:  Nausea and vomiting, intractability of vomiting not specified, unspecified vomiting type  SIRS (systemic inflammatory response syndrome) (HCC)  Cellulitis of left breast    ED Discharge Orders    None       Beryle QuantGekas, Hernan Turnage Marie, PA-C 02/15/19 2144    Benjiman CorePickering, Nathan, MD 02/21/19 2330

## 2019-02-14 NOTE — Telephone Encounter (Signed)
Patients husband called back and informed my coworker he is taking Ingra to [facility] ED now.   Laverle Patter, RN

## 2019-02-14 NOTE — ED Notes (Signed)
Called pt x2 for vitals, no response. °

## 2019-02-14 NOTE — ED Notes (Signed)
Pt tachycardic, pt reports fever this morning and took Tylenol

## 2019-02-15 ENCOUNTER — Other Ambulatory Visit: Payer: Self-pay

## 2019-02-15 ENCOUNTER — Encounter (HOSPITAL_COMMUNITY): Payer: Self-pay

## 2019-02-15 DIAGNOSIS — D649 Anemia, unspecified: Secondary | ICD-10-CM

## 2019-02-15 DIAGNOSIS — N6042 Mammary duct ectasia of left breast: Secondary | ICD-10-CM | POA: Diagnosis present

## 2019-02-15 DIAGNOSIS — R112 Nausea with vomiting, unspecified: Secondary | ICD-10-CM

## 2019-02-15 DIAGNOSIS — R000 Tachycardia, unspecified: Secondary | ICD-10-CM | POA: Diagnosis present

## 2019-02-15 DIAGNOSIS — E869 Volume depletion, unspecified: Secondary | ICD-10-CM

## 2019-02-15 DIAGNOSIS — R945 Abnormal results of liver function studies: Secondary | ICD-10-CM

## 2019-02-15 DIAGNOSIS — R110 Nausea: Secondary | ICD-10-CM | POA: Diagnosis present

## 2019-02-15 LAB — CBC
HCT: 30.6 % — ABNORMAL LOW (ref 36.0–46.0)
Hemoglobin: 9.7 g/dL — ABNORMAL LOW (ref 12.0–15.0)
MCH: 26.4 pg (ref 26.0–34.0)
MCHC: 31.7 g/dL (ref 30.0–36.0)
MCV: 83.2 fL (ref 80.0–100.0)
Platelets: 297 10*3/uL (ref 150–400)
RBC: 3.68 MIL/uL — ABNORMAL LOW (ref 3.87–5.11)
RDW: 15.8 % — ABNORMAL HIGH (ref 11.5–15.5)
WBC: 11.6 10*3/uL — ABNORMAL HIGH (ref 4.0–10.5)
nRBC: 0 % (ref 0.0–0.2)

## 2019-02-15 LAB — HEPATIC FUNCTION PANEL
ALT: 49 U/L — ABNORMAL HIGH (ref 0–44)
AST: 26 U/L (ref 15–41)
Albumin: 2.7 g/dL — ABNORMAL LOW (ref 3.5–5.0)
Alkaline Phosphatase: 75 U/L (ref 38–126)
Bilirubin, Direct: 0.2 mg/dL (ref 0.0–0.2)
Indirect Bilirubin: 0.8 mg/dL (ref 0.3–0.9)
Total Bilirubin: 1 mg/dL (ref 0.3–1.2)
Total Protein: 6.5 g/dL (ref 6.5–8.1)

## 2019-02-15 LAB — URINALYSIS, ROUTINE W REFLEX MICROSCOPIC
Bacteria, UA: NONE SEEN
Bilirubin Urine: NEGATIVE
Glucose, UA: NEGATIVE mg/dL
Ketones, ur: 20 mg/dL — AB
Nitrite: NEGATIVE
Protein, ur: 30 mg/dL — AB
RBC / HPF: >50 RBC/hpf — ABNORMAL HIGH (ref 0–5)
Specific Gravity, Urine: 1.011 (ref 1.005–1.030)
pH: 6 (ref 5.0–8.0)

## 2019-02-15 LAB — SARS CORONAVIRUS 2 BY RT PCR (HOSPITAL ORDER, PERFORMED IN ~~LOC~~ HOSPITAL LAB): SARS Coronavirus 2: NEGATIVE

## 2019-02-15 LAB — PHOSPHORUS: Phosphorus: 2.5 mg/dL (ref 2.5–4.6)

## 2019-02-15 LAB — BASIC METABOLIC PANEL
Anion gap: 9 (ref 5–15)
BUN: 7 mg/dL (ref 6–20)
CO2: 22 mmol/L (ref 22–32)
Calcium: 8.3 mg/dL — ABNORMAL LOW (ref 8.9–10.3)
Chloride: 105 mmol/L (ref 98–111)
Creatinine, Ser: 0.61 mg/dL (ref 0.44–1.00)
GFR calc Af Amer: >60 mL/min (ref >60)
GFR calc non Af Amer: >60 mL/min (ref >60)
Glucose, Bld: 90 mg/dL (ref 70–99)
Potassium: 3.6 mmol/L (ref 3.5–5.1)
Sodium: 136 mmol/L (ref 135–145)

## 2019-02-15 LAB — PROCALCITONIN: Procalcitonin: <0.1 ng/mL

## 2019-02-15 LAB — SEDIMENTATION RATE: Sed Rate: 109 mm/hr — ABNORMAL HIGH (ref 0–22)

## 2019-02-15 LAB — MAGNESIUM: Magnesium: 1.7 mg/dL (ref 1.7–2.4)

## 2019-02-15 LAB — C-REACTIVE PROTEIN: CRP: 16.5 mg/dL — ABNORMAL HIGH (ref <1.0)

## 2019-02-15 MED ORDER — SODIUM CHLORIDE 0.9 % IV BOLUS
1000.0000 mL | Freq: Once | INTRAVENOUS | Status: AC
  Administered 2019-02-15: 1000 mL via INTRAVENOUS

## 2019-02-15 MED ORDER — ENOXAPARIN SODIUM 40 MG/0.4ML ~~LOC~~ SOLN
40.0000 mg | Freq: Every day | SUBCUTANEOUS | Status: DC
  Administered 2019-02-15 – 2019-02-19 (×5): 40 mg via SUBCUTANEOUS
  Filled 2019-02-15 (×5): qty 0.4

## 2019-02-15 MED ORDER — ONDANSETRON HCL 4 MG/2ML IJ SOLN
4.0000 mg | Freq: Four times a day (QID) | INTRAMUSCULAR | Status: DC | PRN
  Administered 2019-02-15: 4 mg via INTRAVENOUS
  Filled 2019-02-15: qty 2

## 2019-02-15 MED ORDER — CEFAZOLIN SODIUM-DEXTROSE 1-4 GM/50ML-% IV SOLN
1.0000 g | Freq: Once | INTRAVENOUS | Status: DC
  Filled 2019-02-15: qty 50

## 2019-02-15 MED ORDER — MORPHINE SULFATE (PF) 2 MG/ML IV SOLN
1.0000 mg | INTRAVENOUS | Status: DC | PRN
  Administered 2019-02-15 – 2019-02-17 (×10): 1 mg via INTRAVENOUS
  Filled 2019-02-15 (×9): qty 1

## 2019-02-15 MED ORDER — MAGNESIUM SULFATE 2 GM/50ML IV SOLN
2.0000 g | Freq: Once | INTRAVENOUS | Status: AC
  Administered 2019-02-15: 2 g via INTRAVENOUS
  Filled 2019-02-15: qty 50

## 2019-02-15 MED ORDER — HYDROCODONE-ACETAMINOPHEN 5-325 MG PO TABS
1.0000 | ORAL_TABLET | Freq: Four times a day (QID) | ORAL | Status: DC | PRN
  Administered 2019-02-15: 2 via ORAL
  Filled 2019-02-15: qty 2

## 2019-02-15 MED ORDER — PIPERACILLIN-TAZOBACTAM 3.375 G IVPB
3.3750 g | Freq: Three times a day (TID) | INTRAVENOUS | Status: DC
  Administered 2019-02-15 (×2): 3.375 g via INTRAVENOUS
  Filled 2019-02-15 (×3): qty 50

## 2019-02-15 MED ORDER — POTASSIUM CHLORIDE IN NACL 20-0.9 MEQ/L-% IV SOLN
INTRAVENOUS | Status: DC
  Administered 2019-02-15 – 2019-02-18 (×5): via INTRAVENOUS
  Filled 2019-02-15 (×8): qty 1000

## 2019-02-15 MED ORDER — SODIUM CHLORIDE 0.9 % IV BOLUS
500.0000 mL | Freq: Once | INTRAVENOUS | Status: AC
  Administered 2019-02-15: 500 mL via INTRAVENOUS

## 2019-02-15 MED ORDER — ONDANSETRON HCL 4 MG/2ML IJ SOLN
4.0000 mg | INTRAMUSCULAR | Status: DC
  Administered 2019-02-15 – 2019-02-16 (×6): 4 mg via INTRAVENOUS
  Filled 2019-02-15 (×6): qty 2

## 2019-02-15 NOTE — Consult Note (Signed)
[facility]    Reason for Consult: Plasma cell mastitis with concern for superimposed cellulitis   Referring Physician: Dr. Marlin CanaryJessica Vann  Principal Problem:   Plasma cell mastitis of left breast Active Problems:   Normocytic anemia   Tachycardia   Nausea   . enoxaparin (LOVENOX) injection  40 mg Subcutaneous Daily  . ondansetron (ZOFRAN) IV  4 mg Intravenous Q4H  . sodium chloride flush  3 mL Intravenous Once    Recommendations: * No indication for antibiotics at this time * Surgery referral for plasma cell mastitis as resection may be indicated * Followup blood cultures  Assessment: Ms. Leisa LenzMohydin is a [age] yo female with PMH of anemia and plasma cell mastitis who presents with fever, chills, nausea, vomiting and generalized body aches. No evidence for superimposed cellulitis on exam and no signs of abscess on imaging. Appears that patient has an unrelated viral gastroenteritis in the setting of breast pain from plasma cell mastitis.  Antibiotics: * During current admission: Day 2 of zosyn * During last hospitalization: Patient was treated with IV ceftriaxone, vancomycin for 7 days and azithromycin for 3 days. She was transitioned to Augmentin to complete two-week course of total treatment.    HPI: Deirdre PippinsSonia Sadaf Gueye is a 28 y.o. female with PMH of anemia and plasma cell mastitis who presents with fever, chills, nausea, vomiting, and generalized body aches.  She initially noted a lump on her left breast on June 24th. She followed up on June 30th with outpatient OB/Gyn and was started on antibiotics with doxycycline and subsequently augmentin without improvement in symptoms.   Ultrasound on 7/8 showed worsening infection with no signs for abscess and she was advised to go to [facility] where she was admitted to hospital on 7/8. Patient was treated empirically for sepsis. However, patient continued to spike fevers to 103 despite antibiotics. CT  chest at that time showed multifocal pneumonia and left mastitis without signs of fluid collection.   Patient was transferred to [facility] on 7/11 due to concerns for COVID (COVID result was negative). CTA was again performed due to concern for PE but again demonstrated bilateral infiltrates. Patient was eventually weaned off of O2. Patient was treated with IV ceftriaxone, vancomycin for 7 days and azithromycin for 3 days. She was transitioned to Augmentin to complete two-week course of total treatment. At discharge on 7/16 patient was feeling much better with improving mastitis.  Per outpatient notes, patient reported near complete resolution of the mastitis of her left breast while completing augmentin. However, approximately 7 days following completion of antibiotics (on 7/25) the infection appeared to return when night sweats and fatigue returned. Patient was restarted on 2 weeks of Augmentin per ID on 7/28. Dr. Derinda LateJarosz from [facility] took biopsies from 2 spots of breast tissue as well as lymph nodes on 7/29. Histopathology suggests plasma cell mastitis, which is a benign, chronic inflammatory condition of the breast that often requires surgical resection due to relapse. This can often be mistaken for inflammatory breast cancer but the results were pathologist reviewed and pathologist is fairly certain of the diagnosis. AFB stains were negative. It was thought that she has relapsing superimposed bacterial cellulitis of the breast. There was also discussion of addition of prednisone to antibiotic course to assist with inflammatory nature of the condition.  Patient now presents with fever, chills, nausea, vomiting, and generalized body aches with pain in joints. Patient denies headache, neck pain, shortness of  breath, chest pain, urinary symptoms. Patient is afebrile with Tmax of 100 and WBC of 11.6, down from 18.8 at last discharge on 7/16. CXR showed normal exam with clearing of the  infiltrates noted on prior study. Patient is on day 2 of zosyn. Blood cultures x2 were collected today. UA shows trace leukocytes, negative nitrites, and patient is without urinary symptoms.   Review of Systems:  Review of Systems  Constitutional: Positive for chills, diaphoresis and fever.  Respiratory: Negative for shortness of breath.   Cardiovascular: Negative for chest pain.  Gastrointestinal: Positive for nausea and vomiting. Negative for abdominal pain and diarrhea.  Genitourinary: Negative for dysuria.  Musculoskeletal: Positive for joint pain, myalgias and neck pain.    All other systems reviewed and are negative    History reviewed. No pertinent past medical history.  Social History   Tobacco Use  . Smoking status: Never Smoker  . Smokeless tobacco: Never Used  Substance Use Topics  . Alcohol use: Never    Frequency: Never  . Drug use: Never    History reviewed. No pertinent family history.  No Known Allergies  Physical Exam: Constitutional: Appears in NAD Vitals:   02/15/19 0446 02/15/19 0718  BP: 116/81 122/81  Pulse: (!) 115 (!) 113  Resp:    Temp: 98.8 F (37.1 C) 98.8 F (37.1 C)  SpO2: 100% 100%   EYES: anicteric Cardiovascular: Tachycardic, no m/r/g Respiratory: CTAB GI: Soft, NT, ND Musculoskeletal: No peripheral edema Breast (left): Tender to palpation diffusely. Mild erythema by biopsy sites, no purulence or fluctuance Hematologic: No bruising, petechiae, bleeding  Lab Results  Component Value Date   WBC 11.6 (H) 02/15/2019   HGB 9.7 (L) 02/15/2019   HCT 30.6 (L) 02/15/2019   MCV 83.2 02/15/2019   PLT 297 02/15/2019    Lab Results  Component Value Date   CREATININE 0.61 02/15/2019   BUN 7 02/15/2019   NA 136 02/15/2019   K 3.6 02/15/2019   CL 105 02/15/2019   CO2 22 02/15/2019    Lab Results  Component Value Date   ALT 49 (H) 02/15/2019   AST 26 02/15/2019   ALKPHOS 75 02/15/2019     Microbiology: Recent Results (from  the past 240 hour(s))  SARS Coronavirus 2 (CEPHEID- Performed in [facility] hospital lab), Hosp Order     Status: None   Collection Time: 02/15/19  1:27 AM   Specimen: Nasopharyngeal Swab  Result Value Ref Range Status   SARS Coronavirus 2 NEGATIVE NEGATIVE Final    Comment: (NOTE) If result is NEGATIVE SARS-CoV-2 target nucleic acids are NOT DETECTED. The SARS-CoV-2 RNA is generally detectable in upper and lower  respiratory specimens during the acute phase of infection. The lowest  concentration of SARS-CoV-2 viral copies this assay can detect is 250  copies / mL. A negative result does not preclude SARS-CoV-2 infection  and should not be used as the sole basis for treatment or other  patient management decisions.  A negative result may occur with  improper specimen collection / handling, submission of specimen other  than nasopharyngeal swab, presence of viral mutation(s) within the  areas targeted by this assay, and inadequate number of viral copies  (<250 copies / mL). A negative result must be combined with clinical  observations, patient history, and epidemiological information. If result is POSITIVE SARS-CoV-2 target nucleic acids are DETECTED. The SARS-CoV-2 RNA is generally detectable in upper and lower  respiratory specimens dur ing the acute phase of infection.  Positive  results are indicative of active infection with SARS-CoV-2.  Clinical  correlation with patient history and other diagnostic information is  necessary to determine patient infection status.  Positive results do  not rule out bacterial infection or co-infection with other viruses. If result is PRESUMPTIVE POSTIVE SARS-CoV-2 nucleic acids MAY BE PRESENT.   A presumptive positive result was obtained on the submitted specimen  and confirmed on repeat testing.  While 2019 novel coronavirus  (SARS-CoV-2) nucleic acids may be present in the submitted sample  additional confirmatory testing may be necessary  for epidemiological  and / or clinical management purposes  to differentiate between  SARS-CoV-2 and other Sarbecovirus currently known to infect humans.  If clinically indicated additional testing with an alternate test  methodology [phone]) is advised. The SARS-CoV-2 RNA is generally  detectable in upper and lower respiratory sp ecimens during the acute  phase of infection. The expected result is Negative. Fact Sheet for Patients:  StrictlyIdeas.no Fact Sheet for Healthcare Providers: BankingDealers.co.za This test is not yet approved or cleared by the [place] FDA and has been authorized for detection and/or diagnosis of SARS-CoV-2 by FDA under an Emergency Use Authorization (EUA).  This EUA will remain in effect (meaning this test can be used) for the duration of the COVID-19 declaration under Section 564(b)(1) of the Act, 21 U.S.C. section 360bbb-3(b)(1), unless the authorization is terminated or revoked sooner. Performed at [facility], [place] [address], [place], [place] [address]     Jeanmarie Hubert, MD, PGY1 [facility] [place] [email or web address] 02/15/2019, 1:45 PM

## 2019-02-15 NOTE — Progress Notes (Signed)
Admitted after midnight. 28 yo admitted for persistant fever, nausea and vomiting unable to keep antibiotic down that she is taking for plasma cell mastitis. Max temp 100 orally, tachycardia, leukocytosis (but did receive solumedrol previous hospitaliztion),  lactic acid and procalcitonin within limits of normal. crp and sed rate elevated. Recently discharged after hospitalized 7/11-7/16 for sepsis related to left breast mastitis and respiratory failure secondary to multifocal pna.   Nausea and vomiting: likely related to mastitis and antibiotics taking for same. Has been unable to keep pain med and antibiotics down of late.  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 Unable to keep augmentin down. Chart review indicates she received one dose solumedrol last hospitalization. crp and sed rate elevated -IV zosyn -consider steroids -pain control -if no improvement consider ID consult   Elevated LFTs: Likely related to above. Improving this am. AST 26 from 47 and ALT 49 from 71, total bili within limits of normal.  -IV fluids as noted -monitor  Anemia: normocytic. Ferritin within limit of normal and low iron as well as low TIBC. per recent anemia panel. No s/sx bleeding. Denies nsaid use -monitor   Dyanne Carrel, NP

## 2019-02-15 NOTE — Telephone Encounter (Signed)
Verbal report that AFB stains were negative of breast tissue sample.

## 2019-02-15 NOTE — ED Notes (Signed)
ED TO INPATIENT HANDOFF REPORT  ED Nurse Name and Phone #: [phone]  S Name/Age/Gender Erica Williamson [place] 28 y.o. female Room/Bed: 028C/028C  Code Status   Code Status: Prior  Home/SNF/Other Home Patient oriented to: self, place, time and situation Is this baseline? Yes   Triage Complete: Triage complete  Chief Complaint Fever/Breast infection  Triage Note Pt in c/o fever and worsening pain to L breast, reports having a biopsy yesterday, pt has a reddened area to the inner L breast, pt reports taking all of her oral medications after discharge from the hospital, A&O x4   Allergies No Known Allergies  Level of Care/Admitting Diagnosis ED Disposition    ED Disposition Condition [place]: [place] [[id number]]  Level of Care: Med-Surg [16]  I expect the patient will be discharged within 24 hours: No (not a candidate for 5C-Observation unit)  Covid Evaluation: Asymptomatic Screening Protocol (No Symptoms)  Diagnosis: Plasma cell mastitis of left breast [0867619]  Admitting Physician: Bonnell Public [[id number]]  Attending Physician: Dana Allan I [[id number]]  PT Class (Do Not Modify): Observation [104]  PT Acc Code (Do Not Modify): Observation [10022]       B Medical/Surgery History History reviewed. No pertinent past medical history. Past Surgical History:  Procedure Laterality Date  . CESAREAN SECTION     X2     A IV Location/Drains/Wounds Patient Lines/Drains/Airways Status   Active Line/Drains/Airways    Name:   Placement date:   Placement time:   Site:   Days:   Peripheral IV 02/14/19 Left Antecubital   02/14/19    2248    Antecubital   1   Midline Single Lumen 01/27/19 Midline Left Cephalic 10 cm 0 cm   50/93/26    7124    Cephalic   19          Intake/Output Last 24 hours No intake or output data in the 24 hours ending 02/15/19 0134  Labs/Imaging Results for orders placed or performed during the hospital  encounter of 02/14/19 (from the past 48 hour(s))  Lactic acid, plasma     Status: None   Collection Time: 02/14/19  1:34 PM  Result Value Ref Range   Lactic Acid, Venous 1.0 0.5 - 1.9 mmol/L    Comment: Performed at [facility], [address], [place], [place] [address]  Comprehensive metabolic panel     Status: Abnormal   Collection Time: 02/14/19  1:36 PM  Result Value Ref Range   Sodium 134 (L) 135 - 145 mmol/L   Potassium 3.5 3.5 - 5.1 mmol/L   Chloride 100 98 - 111 mmol/L   CO2 20 (L) 22 - 32 mmol/L   Glucose, Bld 103 (H) 70 - 99 mg/dL   BUN 6 6 - 20 mg/dL   Creatinine, Ser 0.62 0.44 - 1.00 mg/dL   Calcium 9.4 8.9 - 10.3 mg/dL   Total Protein 8.4 (H) 6.5 - 8.1 g/dL   Albumin 3.5 3.5 - 5.0 g/dL   AST 47 (H) 15 - 41 U/L   ALT 71 (H) 0 - 44 U/L   Alkaline Phosphatase 101 38 - 126 U/L   Total Bilirubin 1.4 (H) 0.3 - 1.2 mg/dL   GFR calc non Af Amer >60 >60 mL/min   GFR calc Af Amer >60 >60 mL/min   Anion gap 14 5 - 15    Comment: Performed at [facility], [place],  [place] [id number]  CBC with Differential     Status: Abnormal   Collection Time: 02/14/19  1:36 PM  Result Value Ref Range   WBC 16.9 (H) 4.0 - 10.5 K/uL   RBC 4.41 3.87 - 5.11 MIL/uL   Hemoglobin 11.6 (L) 12.0 - 15.0 g/dL   HCT 91.436.0 78.236.0 - 95.646.0 %   MCV 81.6 80.0 - 100.0 fL   MCH 26.3 26.0 - 34.0 pg   MCHC 32.2 30.0 - 36.0 g/dL   RDW 21.315.8 (H) 08.611.5 - 57.815.5 %   Platelets 389 150 - 400 K/uL   nRBC 0.0 0.0 - 0.2 %   Neutrophils Relative % 81 %   Neutro Abs 13.6 (H) 1.7 - 7.7 K/uL   Lymphocytes Relative 11 %   Lymphs Abs 1.9 0.7 - 4.0 K/uL   Monocytes Relative 7 %   Monocytes Absolute 1.2 (H) 0.1 - 1.0 K/uL   Eosinophils Relative 0 %   Eosinophils Absolute 0.1 0.0 - 0.5 K/uL   Basophils Relative 1 %   Basophils Absolute 0.1 0.0 - 0.1 K/uL   Immature Granulocytes 0 %   Abs Immature Granulocytes 0.07 0.00 - 0.07 K/uL    Comment: Performed at [facility], [address] [place], [place] [address]  I-Stat beta hCG blood, ED     Status: None   Collection Time: 02/14/19  1:55 PM  Result Value Ref Range   I-stat hCG, quantitative <5.0 <5 mIU/mL   Comment 3            Comment:   GEST. AGE      CONC.  (mIU/mL)   <=1 WEEK        5 - 50     2 WEEKS       50 - 500     3 WEEKS       100 - 10,000     4 WEEKS     1,000 - 30,000        FEMALE AND NON-PREGNANT FEMALE:     LESS THAN 5 mIU/mL   Lactic acid, plasma     Status: None   Collection Time: 02/14/19 10:12 PM  Result Value Ref Range   Lactic Acid, Venous 1.4 0.5 - 1.9 mmol/L    Comment: Performed at [facility], [address] [place], [place] [address]  Urinalysis, Routine w reflex microscopic     Status: Abnormal   Collection Time: 02/15/19 12:09 AM  Result Value Ref Range   Color, Urine YELLOW YELLOW   APPearance CLEAR CLEAR   Specific Gravity, Urine 1.011 1.005 - 1.030   pH 6.0 5.0 - 8.0   Glucose, UA NEGATIVE NEGATIVE mg/dL   Hgb urine dipstick LARGE (A) NEGATIVE   Bilirubin Urine NEGATIVE NEGATIVE   Ketones, ur 20 (A) NEGATIVE mg/dL   Protein, ur 30 (A) NEGATIVE mg/dL   Nitrite NEGATIVE NEGATIVE   Leukocytes,Ua TRACE (A) NEGATIVE   RBC / HPF >50 (H) 0 - 5 RBC/hpf   WBC, UA 6-10 0 - 5 WBC/hpf   Bacteria, UA NONE SEEN NONE SEEN   Mucus PRESENT    Hyaline Casts, UA PRESENT     Comment: Performed at [facility], [address] [place], [place] [address]   Dg Chest 2 View  Result Date: 02/14/2019 CLINICAL DATA:  Fever.  Vomiting. EXAM: CHEST - 2 VIEW COMPARISON:  01/26/2019 FINDINGS: The heart size and mediastinal contours are within normal limits. Both lungs  are clear. The visualized skeletal structures are unremarkable. IMPRESSION: Normal exam.  The clearing of the infiltrates since the prior study. Electronically Signed   By: Francene BoyersJames  Maxwell M.D.   On: 02/14/2019 14:37    Pending Labs Unresulted Labs (From admission, onward)    Start     Ordered   02/15/19 0106  SARS  Coronavirus 2 (CEPHEID- Performed in [facility] hospital lab), Hosp Order  (Symptomatic Patients Labs with Precautions )  Once,   STAT    Question:  Patient immune status  Answer:  Normal   02/15/19 0105   02/15/19 0038  Blood culture (routine x 2)  BLOOD CULTURE X 2,   STAT     02/15/19 0038   Signed and Held  CBC  (enoxaparin (LOVENOX)    CrCl >/= 30 ml/min)  Once,   R    Comments: Baseline for enoxaparin therapy IF NOT ALREADY DRAWN.  Notify MD if PLT < 100 K.    Signed and Held   Signed and Held  Creatinine, serum  (enoxaparin (LOVENOX)    CrCl >/= 30 ml/min)  Once,   R    Comments: Baseline for enoxaparin therapy IF NOT ALREADY DRAWN.    Signed and Held   Signed and Held  Creatinine, serum  (enoxaparin (LOVENOX)    CrCl >/= 30 ml/min)  Weekly,   R    Comments: while on enoxaparin therapy    Signed and Held   Signed and Held  Magnesium  Once,   R     Signed and Held   Signed and Held  Phosphorus  Once,   R     Signed and Held   Signed and Armed forces training and education officerHeld  Basic metabolic panel  Tomorrow morning,   R     Signed and Held   Medical illustratorigned and Held  CBC  Tomorrow morning,   R     Signed and Held   Medical illustratorigned and Held  Hepatic function panel  Once,   R     Signed and Held   Signed and Held  Procalcitonin - Baseline  ONCE - STAT,   STAT     Signed and Held   Signed and Held  Sedimentation rate  Once,   R     Signed and Held   Signed and Held  C-reactive protein  Once,   R     Signed and Held          Vitals/Pain Today's Vitals   02/14/19 2230 02/14/19 2245 02/14/19 2300 02/14/19 2358  BP: 116/77 94/81 120/78   Pulse: (!) 128 (!) 124 (!) 117   Resp:      Temp:      TempSrc:      SpO2: 99% 98% 100%   Weight:      Height:      PainSc:    5     Isolation Precautions No active isolations  Medications Medications  sodium chloride flush (NS) 0.9 % injection 3 mL (0 mLs Intravenous Hold 02/14/19 2300)  piperacillin-tazobactam (ZOSYN) IVPB 3.375 g (has no administration in time range)   sodium chloride 0.9 % bolus 1,000 mL (1,000 mLs Intravenous New Bag/Given 02/14/19 2251)  ondansetron (ZOFRAN) injection 4 mg (4 mg Intravenous Given 02/14/19 2256)  sodium chloride 0.9 % bolus 1,000 mL (1,000 mLs Intravenous New Bag/Given 02/15/19 0122)    Mobility walks Low fall risk   Focused Assessments  R Recommendations: See Admitting Provider Note  Report given to:  Additional Notes:

## 2019-02-15 NOTE — H&P (Signed)
History and Physical  Deirdre PippinsSonia Sadaf Campillo HQI:[id number]:[id number] DOB: October 14, 1990 DOA: 02/14/2019  Referring physician: ER provider PCP: Patient, No Pcp Per  Outpatient Specialists: Infectious disease as well as OB/GYN Patient coming from: Home  Chief Complaint: Fever, nausea and vomiting  HPI:  Patient is a 28 year old female who has been worked up for cellulitis and induration of the left breast in the last couple of weeks.  Patient was discharged from this hospital on 01/31/2019, with the plan being for the patient to follow-up with the infectious disease team.  Patient was seen in consultation by the surgical team on 01/26/2019.  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  Patient presents with fever, chills, nausea and vomiting.  According to the patient, she has not been able to keep anything down in the last 2 days.  Last nausea vomiting was about 12 hours ago.  Hospitalist team has been asked to admit patient due to volume depletion and concerns that patient is not keeping down her oral antibiotics, Augmentin.  No headache, no neck pain, no shortness of breath, no chest pain, no urinary symptoms.  Patient will be admitted for further assessment and management.  ED Course: On presentation to the hospital, temperature was 98.8, heart rate of 117 bpm, respiratory rate of 16 and blood pressure 120/70 mmHg.  O2 sat is 100% on room air.  Pertinent labs revealed WBC of 16.9 (improving), sodium of 134, CO2 of 20, potassium of 3.5 lactic acid of 1.4 with blood sugar 103.  Pertinent labs: CBC reveals WBC of 16.9, hemoglobin of 11.6, hematocrit of 36, MCV of 81.6 with platelet count of 389.  Chemistry reveals sodium of 134, potassium of 3.5, chloride 100, CO2 of 20, BUN of 6 and creatinine of 0.62 with blood sugar of 138.  AST is 47 and ALT 71.  Low bilirubin is 1.4.  Lactic acid is 1.4.  Review of Systems:  Negative for fever, visual changes, sore throat, rash, new  muscle aches, chest pain, SOB, dysuria, bleeding, n/v/abdominal pain.  History reviewed. No pertinent past medical history.  Past Surgical History:  Procedure Laterality Date  . CESAREAN SECTION     X2     reports that she has never smoked. She has never used smokeless tobacco. She reports that she does not drink alcohol or use drugs.  No Known Allergies  No family history on file.   Prior to Admission medications   Medication Sig Start Date End Date Taking? Authorizing Provider  amoxicillin-clavulanate (AUGMENTIN) 600-42.9 MG/5ML suspension Take 7.3 mLs (875 mg total) by mouth 2 (two) times daily. 02/13/19   Blanchard Kelchixon, Stephanie N, NP  ondansetron (ZOFRAN ODT) 4 MG disintegrating tablet Take 1 tablet (4 mg total) by mouth every 8 (eight) hours as needed for nausea or vomiting. 02/14/19   Blanchard Kelchixon, Stephanie N, NP    Physical Exam: Vitals:   02/14/19 2217 02/14/19 2230 02/14/19 2245 02/14/19 2300  BP:  116/77 94/81 120/78  Pulse:  (!) 128 (!) 124 (!) 117  Resp:      Temp:      TempSrc:      SpO2:  99% 98% 100%  Weight: 63.5 kg     Height: 5\' 4"  (1.626 m)       Constitutional:  . Not in any significant distress. Eyes:  . No pallor. No jaundice.  ENMT:  . external ears, nose appear normal.  Dry buccal mucosa. Neck:  . Neck is supple. No JVD  Respiratory:  . CTA bilaterally, no w/r/r.  . Respiratory effort normal. No retractions or accessory muscle use Cardiovascular:  . S1S2 . No LE extremity edema   Abdomen:  . Abdomen is soft and non tender. Organs are difficult to assess. Neurologic:  . Awake and alert. . Moves all limbs. Breast examination: Induration of the left breast, with scanty areas of redness, and tenderness on palpating left axillary lymph nodes. Wt Readings from Last 3 Encounters:  02/14/19 63.5 kg  01/31/19 75 kg    I have personally reviewed following labs and imaging studies  Labs on Admission:  CBC: Recent Labs  Lab 02/14/19 1336  WBC 16.9*   NEUTROABS 13.6*  HGB 11.6*  HCT 36.0  MCV 81.6  PLT 389   Basic Metabolic Panel: Recent Labs  Lab 02/14/19 1336  NA 134*  K 3.5  CL 100  CO2 20*  GLUCOSE 103*  BUN 6  CREATININE 0.62  CALCIUM 9.4   Liver Function Tests: Recent Labs  Lab 02/14/19 1336  AST 47*  ALT 71*  ALKPHOS 101  BILITOT 1.4*  PROT 8.4*  ALBUMIN 3.5   No results for input(s): LIPASE, AMYLASE in the last 168 hours. No results for input(s): AMMONIA in the last 168 hours. Coagulation Profile: No results for input(s): INR, PROTIME in the last 168 hours. Cardiac Enzymes: No results for input(s): CKTOTAL, CKMB, CKMBINDEX, TROPONINI in the last 168 hours. BNP (last 3 results) No results for input(s): PROBNP in the last 8760 hours. HbA1C: No results for input(s): HGBA1C in the last 72 hours. CBG: No results for input(s): GLUCAP in the last 168 hours. Lipid Profile: No results for input(s): CHOL, HDL, LDLCALC, TRIG, CHOLHDL, LDLDIRECT in the last 72 hours. Thyroid Function Tests: No results for input(s): TSH, T4TOTAL, FREET4, T3FREE, THYROIDAB in the last 72 hours. Anemia Panel: No results for input(s): VITAMINB12, FOLATE, FERRITIN, TIBC, IRON, RETICCTPCT in the last 72 hours. Urine analysis:    Component Value Date/Time   COLORURINE YELLOW 02/15/2019 0009   APPEARANCEUR CLEAR 02/15/2019 0009   LABSPEC 1.011 02/15/2019 0009   PHURINE 6.0 02/15/2019 0009   GLUCOSEU NEGATIVE 02/15/2019 0009   HGBUR LARGE (A) 02/15/2019 0009   BILIRUBINUR NEGATIVE 02/15/2019 0009   KETONESUR 20 (A) 02/15/2019 0009   PROTEINUR 30 (A) 02/15/2019 0009   NITRITE NEGATIVE 02/15/2019 0009   LEUKOCYTESUR TRACE (A) 02/15/2019 0009   Sepsis Labs: @LABRCNTIP (procalcitonin:4,lacticidven:4) )No results found for this or any previous visit (from the past 240 hour(s)).    Radiological Exams on Admission: Dg Chest 2 View  Result Date: 02/14/2019 CLINICAL DATA:  Fever.  Vomiting. EXAM: CHEST - 2 VIEW COMPARISON:   01/26/2019 FINDINGS: The heart size and mediastinal contours are within normal limits. Both lungs are clear. The visualized skeletal structures are unremarkable. IMPRESSION: Normal exam.  The clearing of the infiltrates since the prior study. Electronically Signed   By: Francene BoyersJames  Maxwell M.D.   On: 02/14/2019 14:37    Active Problems:   Plasma cell mastitis of left breast   Assessment/Plan Plasma cell mastitis of the left breast: Patient will benefit from surgical team's (breast surgeon) input. IV Zosyn as patient not keeping down Augmentin.  Please monitor liver function tests. Please consult the surgical team later this morning. Please also consult infectious disease team.  Nausea and vomiting: Supportive care for now. Etiology unclear.  Volume depletion: IV fluids.  Elevated LFTs: Cause unclear. Could be related to nausea and vomiting Could be related to antibiotics Continue  to assess Further management will depend on hospital course   DVT prophylaxis: Subcute Lovenox Code Status: Full code Family Communication: Husband Disposition Plan: Will depend on hospital course Consults called: Please consult surgery and infectious disease teams later this morning Admission status: Observation  Time spent: 60 minutes.   Dana Allan, MD  [facility] Pager #: [phone] 7PM-7AM contact night coverage as above  02/15/2019, 1:17 AM

## 2019-02-15 NOTE — TOC Initial Note (Signed)
Transition of Care [facility]) - Initial/Assessment Note    Patient Details  Name: Erica Williamson MRN: [id number] Date of Birth: 01-05-1991  Transition of Care [facility]) CM/SW Contact:    Pollie Friar, RN Phone Number: 02/15/2019, 4:47 PM  Clinical Narrative:                 Pt without insurance and no PCP. CM met with her and she is already set up at [facility]. Appt is on AVS.  Pt has transportation home when medically ready. She may require medication assist at d/c.  TOC following.  Expected Discharge Plan: Home/Self Care Barriers to Discharge: Continued Medical Work up, Inadequate or no insurance   Patient Goals and CMS Choice        Expected Discharge Plan and Services Expected Discharge Plan: Home/Self Care   Discharge Planning Services: CM Consult                                          Prior Living Arrangements/Services   Lives with:: Spouse Patient language and need for interpreter reviewed:: Yes(no needs) Do you feel safe going back to the place where you live?: Yes      Need for Family Participation in Patient Care: No (Comment) Care giver support system in place?: No (comment)   Criminal Activity/Legal Involvement Pertinent to Current Situation/Hospitalization: No - Comment as needed  Activities of Daily Living Home Assistive Devices/Equipment: None ADL Screening (condition at time of admission) Patient's cognitive ability adequate to safely complete daily activities?: Yes Is the patient deaf or have difficulty hearing?: No Does the patient have difficulty seeing, even when wearing glasses/contacts?: No Does the patient have difficulty concentrating, remembering, or making decisions?: No Patient able to express need for assistance with ADLs?: Yes Does the patient have difficulty dressing or bathing?: No Independently performs ADLs?: Yes (appropriate for developmental age) Does the patient have difficulty walking or climbing stairs?: No Weakness of  Legs: None Weakness of Arms/Hands: None  Permission Sought/Granted                  Emotional Assessment Appearance:: Appears stated age Attitude/Demeanor/Rapport: Engaged Affect (typically observed): Accepting, Pleasant Orientation: : Oriented to Self, Oriented to Place, Oriented to  Time, Oriented to Situation   Psych Involvement: No (comment)  Admission diagnosis:  SIRS (systemic inflammatory response syndrome) (HCC) [R65.10] Cellulitis of left breast [N61.0] Nausea and vomiting, intractability of vomiting not specified, unspecified vomiting type [R11.2] Patient Active Problem List   Diagnosis Date Noted  . Plasma cell mastitis of left breast 02/15/2019  . Tachycardia 02/15/2019  . Nausea 02/15/2019  . CAP (community acquired pneumonia)   . Mastitis 01/26/2019  . Transient hypotension 01/26/2019  . AKI (acute kidney injury) ([place]) 01/26/2019  . Normocytic anemia 01/26/2019   PCP:  Patient, No Pcp Per Pharmacy:   [facility] [place], [address] [place] [address] Phone: [phone] Fax: [phone]  [place], [place] - [place] [address] [place] [address] [place] [address] Phone: [phone] Fax: [phone]     Social Determinants of Health (SDOH) Interventions    Readmission Risk Interventions No flowsheet data found.

## 2019-02-15 NOTE — ED Notes (Addendum)
Report give to floor(susan RN), waiting approval of bed per charge on floor and pending covid test.

## 2019-02-15 NOTE — Progress Notes (Signed)
Skin assessment: large firm non movable area left lateral breast. Tender to touch. No redness or warmth noted. 2 large steri strips making an "X" close to the lateral side of nipple. No open area noted. Pt had a biopsy done of the area a couple of days ago. No drainage from site.

## 2019-02-16 MED ORDER — MAGNESIUM SULFATE 2 GM/50ML IV SOLN
2.0000 g | Freq: Once | INTRAVENOUS | Status: AC
  Administered 2019-02-16: 2 g via INTRAVENOUS
  Filled 2019-02-16: qty 50

## 2019-02-16 MED ORDER — ONDANSETRON HCL 4 MG/2ML IJ SOLN
4.0000 mg | Freq: Four times a day (QID) | INTRAMUSCULAR | Status: DC | PRN
  Administered 2019-02-17 – 2019-02-19 (×3): 4 mg via INTRAVENOUS
  Filled 2019-02-16 (×3): qty 2

## 2019-02-16 MED ORDER — METHOCARBAMOL 1000 MG/10ML IJ SOLN
500.0000 mg | Freq: Three times a day (TID) | INTRAVENOUS | Status: DC | PRN
  Administered 2019-02-17: 500 mg via INTRAVENOUS
  Filled 2019-02-16 (×2): qty 5

## 2019-02-16 NOTE — Progress Notes (Signed)
Updated patient's husband per MD notes and available labs. He attempted a visit today and was denied by security. I informed him to have them call the nurses' station tomorrow, as Dr. Eliseo Squires has authorized him to visit.

## 2019-02-16 NOTE — Progress Notes (Signed)
Progress Note    Erica Williamson  ZDG:[id number] DOB: 02/22/1991  DOA: 02/14/2019 PCP: Patient, No Pcp Per    Brief Narrative:     Medical records reviewed and are as summarized below:  Erica Williamson is an 28 y.o. female PMH of anemia and plasma cell mastitis who presents with fever, chills, nausea, vomiting and generalized body aches.  Assessment/Plan:   Principal Problem:   Plasma cell mastitis of left breast Active Problems:   Mastitis   Normocytic anemia   Tachycardia   Nausea   Nausea and vomiting-- ? Viral gastroenteritis with a reactive arthritis Supportive care for now.  Volume depletion: IV fluids.  Plasma cell mastitis of the left breast: -will need outpatient breast surgeon follow up for possible resection -per ID no need for further abx  Elevated LFTs: Cause unclear. Could be related to nausea and vomiting Could be related to antibiotics trend   Family Communication/Anticipated D/C date and plan/Code Status   DVT prophylaxis: Lovenox ordered. Code Status: Full Code.  Family Communication:  Disposition Plan:    Medical Consultants:    ID  Subjective:   C/o pain in her joint and weakness C/o neck pain  Objective:    Vitals:   02/16/19 0126 02/16/19 0354 02/16/19 0540 02/16/19 0740  BP:  102/74 104/76 104/79  Pulse:  (!) 104 (!) 101 (!) 101  Resp:    16  Temp: 99.5 F (37.5 C) 98.3 F (36.8 C) 98.6 F (37 C) 98.3 F (36.8 C)  TempSrc: Oral Oral Oral Oral  SpO2:  100%  100%  Weight:      Height:        Intake/Output Summary (Last 24 hours) at 02/16/2019 1453 Last data filed at 02/16/2019 0947 Gross per 24 hour  Intake 2331.63 ml  Output 350 ml  Net 1981.63 ml   Filed Weights   02/14/19 2217  Weight: 63.5 kg    Exam: In bed, ill appearing  Data Reviewed:   I have personally reviewed following labs and imaging studies:  Labs: Labs show the following:   Basic Metabolic Panel: Recent Labs  Lab  02/14/19 1336 02/15/19 0254  NA 134* 136  K 3.5 3.6  CL 100 105  CO2 20* 22  GLUCOSE 103* 90  BUN 6 7  CREATININE 0.62 0.61  CALCIUM 9.4 8.3*  MG  --  1.7  PHOS  --  2.5   GFR Estimated Creatinine Clearance: 89.6 mL/min (by C-G formula based on SCr of 0.61 mg/dL). Liver Function Tests: Recent Labs  Lab 02/14/19 1336 02/15/19 0254  AST 47* 26  ALT 71* 49*  ALKPHOS 101 75  BILITOT 1.4* 1.0  PROT 8.4* 6.5  ALBUMIN 3.5 2.7*   No results for input(s): LIPASE, AMYLASE in the last 168 hours. No results for input(s): AMMONIA in the last 168 hours. Coagulation profile No results for input(s): INR, PROTIME in the last 168 hours.  CBC: Recent Labs  Lab 02/14/19 1336 02/15/19 0254  WBC 16.9* 11.6*  NEUTROABS 13.6*  --   HGB 11.6* 9.7*  HCT 36.0 30.6*  MCV 81.6 83.2  PLT 389 297   Cardiac Enzymes: No results for input(s): CKTOTAL, CKMB, CKMBINDEX, TROPONINI in the last 168 hours. BNP (last 3 results) No results for input(s): PROBNP in the last 8760 hours. CBG: No results for input(s): GLUCAP in the last 168 hours. D-Dimer: No results for input(s): DDIMER in the last 72 hours. Hgb A1c: No results for input(s):  HGBA1C in the last 72 hours. Lipid Profile: No results for input(s): CHOL, HDL, LDLCALC, TRIG, CHOLHDL, LDLDIRECT in the last 72 hours. Thyroid function studies: No results for input(s): TSH, T4TOTAL, T3FREE, THYROIDAB in the last 72 hours.  Invalid input(s): FREET3 Anemia work up: No results for input(s): VITAMINB12, FOLATE, FERRITIN, TIBC, IRON, RETICCTPCT in the last 72 hours. Sepsis Labs: Recent Labs  Lab 02/14/19 1334 02/14/19 1336 02/14/19 2212 02/15/19 0254  PROCALCITON  --   --   --  <0.10  WBC  --  16.9*  --  11.6*  LATICACIDVEN 1.0  --  1.4  --     Microbiology Recent Results (from the past 240 hour(s))  Blood culture (routine x 2)     Status: None (Preliminary result)   Collection Time: 02/15/19 12:57 AM   Specimen: BLOOD  Result  Value Ref Range Status   Specimen Description BLOOD RIGHT ANTECUBITAL  Final   Special Requests   Final    BOTTLES DRAWN AEROBIC AND ANAEROBIC Blood Culture results may not be optimal due to an excessive volume of blood received in culture bottles   Culture   Final    NO GROWTH 1 DAY Performed at [facility], [address] [place], [place] [address]    Report Status PENDING  Incomplete  Blood culture (routine x 2)     Status: None (Preliminary result)   Collection Time: 02/15/19 12:58 AM   Specimen: BLOOD RIGHT HAND  Result Value Ref Range Status   Specimen Description BLOOD RIGHT HAND  Final   Special Requests   Final    BOTTLES DRAWN AEROBIC AND ANAEROBIC Blood Culture results may not be optimal due to an excessive volume of blood received in culture bottles   Culture   Final    NO GROWTH 1 DAY Performed at [facility], [address] [place], [place] [address]    Report Status PENDING  Incomplete  SARS Coronavirus 2 (CEPHEID- Performed in [facility] hospital lab), Hosp Order     Status: None   Collection Time: 02/15/19  1:27 AM   Specimen: Nasopharyngeal Swab  Result Value Ref Range Status   SARS Coronavirus 2 NEGATIVE NEGATIVE Final    Comment: (NOTE) If result is NEGATIVE SARS-CoV-2 target nucleic acids are NOT DETECTED. The SARS-CoV-2 RNA is generally detectable in upper and lower  respiratory specimens during the acute phase of infection. The lowest  concentration of SARS-CoV-2 viral copies this assay can detect is 250  copies / mL. A negative result does not preclude SARS-CoV-2 infection  and should not be used as the sole basis for treatment or other  patient management decisions.  A negative result may occur with  improper specimen collection / handling, submission of specimen other  than nasopharyngeal swab, presence of viral mutation(s) within the  areas targeted by this assay, and inadequate number of viral copies  (<250 copies / mL). A negative  result must be combined with clinical  observations, patient history, and epidemiological information. If result is POSITIVE SARS-CoV-2 target nucleic acids are DETECTED. The SARS-CoV-2 RNA is generally detectable in upper and lower  respiratory specimens dur ing the acute phase of infection.  Positive  results are indicative of active infection with SARS-CoV-2.  Clinical  correlation with patient history and other diagnostic information is  necessary to determine patient infection status.  Positive results do  not rule out bacterial infection or co-infection with other viruses. If result is PRESUMPTIVE POSTIVE  SARS-CoV-2 nucleic acids MAY BE PRESENT.   A presumptive positive result was obtained on the submitted specimen  and confirmed on repeat testing.  While 2019 novel coronavirus  (SARS-CoV-2) nucleic acids may be present in the submitted sample  additional confirmatory testing may be necessary for epidemiological  and / or clinical management purposes  to differentiate between  SARS-CoV-2 and other Sarbecovirus currently known to infect humans.  If clinically indicated additional testing with an alternate test  methodology [phone]([id number]) is advised. The SARS-CoV-2 RNA is generally  detectable in upper and lower respiratory sp ecimens during the acute  phase of infection. The expected result is Negative. Fact Sheet for Patients:  BoilerBrush.com.cyhttps://www.fda.gov/media/136312/download Fact Sheet for Healthcare Providers: https://pope.com/https://www.fda.gov/media/136313/download This test is not yet approved or cleared by the [place] FDA and has been authorized for detection and/or diagnosis of SARS-CoV-2 by FDA under an Emergency Use Authorization (EUA).  This EUA will remain in effect (meaning this test can be used) for the duration of the COVID-19 declaration under Section 564(b)(1) of the Act, 21 U.S.C. section 360bbb-3(b)(1), unless the authorization is terminated or revoked sooner. Performed at [facility], [address] [place], [place] [address]     Procedures and diagnostic studies:  No results found.  Medications:   . enoxaparin (LOVENOX) injection  40 mg Subcutaneous Daily  . sodium chloride flush  3 mL Intravenous Once   Continuous Infusions: . 0.9 % NaCl with KCl 20 mEq / L 75 mL/hr at 02/15/19 2154  . methocarbamol (ROBAXIN) IV       LOS: 1 day   Joseph ArtJessica U Beyonka Pitney  [facility]   How to contact the [facility] Attending or Consulting provider 7A - 7P or covering provider during after hours 7P -7A, for this patient?  1. Check the care team in [facility] and look for a) attending/consulting [facility] provider listed and b) the [facility] team listed 2. Log into [email or web address] and use [facility]'s universal password to access. If you do not have the password, please contact the hospital operator. 3. Locate the [facility] provider you are looking for under [facility] and page to a number that you can be directly reached. 4. If you still have difficulty reaching the provider, please page the [facility] (Director on Call) for the Hospitalists listed on amion for assistance.  02/16/2019, 2:53 PM

## 2019-02-17 LAB — COMPREHENSIVE METABOLIC PANEL
ALT: 34 U/L (ref 0–44)
ALT: 33 U/L (ref 0–44)
AST: 20 U/L (ref 15–41)
AST: 20 U/L (ref 15–41)
Albumin: 2.5 g/dL — ABNORMAL LOW (ref 3.5–5.0)
Albumin: 2.7 g/dL — ABNORMAL LOW (ref 3.5–5.0)
Alkaline Phosphatase: 78 U/L (ref 38–126)
Alkaline Phosphatase: 84 U/L (ref 38–126)
Anion gap: 8 (ref 5–15)
Anion gap: 9 (ref 5–15)
BUN: <5 mg/dL — ABNORMAL LOW (ref 6–20)
BUN: 6 mg/dL (ref 6–20)
CO2: 23 mmol/L (ref 22–32)
CO2: 23 mmol/L (ref 22–32)
Calcium: 8.7 mg/dL — ABNORMAL LOW (ref 8.9–10.3)
Calcium: 8.9 mg/dL (ref 8.9–10.3)
Chloride: 106 mmol/L (ref 98–111)
Chloride: 104 mmol/L (ref 98–111)
Creatinine, Ser: 0.56 mg/dL (ref 0.44–1.00)
Creatinine, Ser: 0.5 mg/dL (ref 0.44–1.00)
GFR calc Af Amer: >60 mL/min (ref >60)
GFR calc Af Amer: >60 mL/min (ref >60)
GFR calc non Af Amer: >60 mL/min (ref >60)
GFR calc non Af Amer: >60 mL/min (ref >60)
Glucose, Bld: 102 mg/dL — ABNORMAL HIGH (ref 70–99)
Glucose, Bld: 131 mg/dL — ABNORMAL HIGH (ref 70–99)
Potassium: 4.2 mmol/L (ref 3.5–5.1)
Potassium: 4.1 mmol/L (ref 3.5–5.1)
Sodium: 137 mmol/L (ref 135–145)
Sodium: 136 mmol/L (ref 135–145)
Total Bilirubin: 0.7 mg/dL (ref 0.3–1.2)
Total Bilirubin: 0.5 mg/dL (ref 0.3–1.2)
Total Protein: 6.9 g/dL (ref 6.5–8.1)
Total Protein: 7.5 g/dL (ref 6.5–8.1)

## 2019-02-17 LAB — CBC
HCT: 29.3 % — ABNORMAL LOW (ref 36.0–46.0)
Hemoglobin: 9.7 g/dL — ABNORMAL LOW (ref 12.0–15.0)
MCH: 26.5 pg (ref 26.0–34.0)
MCHC: 33.1 g/dL (ref 30.0–36.0)
MCV: 80.1 fL (ref 80.0–100.0)
Platelets: 302 10*3/uL (ref 150–400)
RBC: 3.66 MIL/uL — ABNORMAL LOW (ref 3.87–5.11)
RDW: 15.3 % (ref 11.5–15.5)
WBC: 13.7 10*3/uL — ABNORMAL HIGH (ref 4.0–10.5)
nRBC: 0 % (ref 0.0–0.2)

## 2019-02-17 LAB — MAGNESIUM: Magnesium: 1.9 mg/dL (ref 1.7–2.4)

## 2019-02-17 MED ORDER — METHYLPREDNISOLONE SODIUM SUCC 40 MG IJ SOLR
40.0000 mg | Freq: Three times a day (TID) | INTRAMUSCULAR | Status: DC
  Administered 2019-02-17 – 2019-02-18 (×4): 40 mg via INTRAVENOUS
  Filled 2019-02-17 (×4): qty 1

## 2019-02-17 NOTE — Progress Notes (Signed)
Patient is complaining that her joint pain is worsening. She says she had it when she came in (pain in shoulders and the back of her neck, for example) when she came in, but it is getting progressively worse. This morning, she says that her feet and ankles hurt so badly that it is difficult for her to bear weight. She is also complaining of pain in her hips and wrists (all joints). She has swelling in her feet and ankles (non-pitting) and has developed scattered red spots and light bruising from the knees downward. The redness and bruising is concentrated around the feet, ankles and knees. Patient was able to get up to the bsc this morning, but required two hands for transfer assistance. Notified Dr. Eliseo Squires.

## 2019-02-17 NOTE — Progress Notes (Signed)
Progress Note    Erica RisingSonia Sadaf Rae  [id number] DOB: October 23, 1990  DOA: 02/14/2019 PCP: Patient, No Pcp Per    Brief Narrative:     Medical records reviewed and are as summarized below:  Erica PippinsSonia Sadaf Buresh is an 28 y.o. female PMH of anemia and plasma cell mastitis who presents with fever, chills, nausea, vomiting and generalized body aches.  Assessment/Plan:   Principal Problem:   Plasma cell mastitis of left breast Active Problems:   Mastitis   Normocytic anemia   Tachycardia   Nausea   Nausea and vomiting-- ? Viral gastroenteritis with a reactive arthritis Supportive care for now. -never had diarrhea  Volume depletion: IV fluids.  Plasma cell mastitis of the left breast: -will need outpatient breast surgeon follow up for possible resection -per ID no need for further abx -have asked for pathology results-- ? IGM (granulomatous mastitis)-- -have started steroids   Joint pain -improved with IV steroids  Lower ext nodules -appears to be erythema nodosum -will see if we can get biopsy done  Elevated LFTs: -resolved   Family Communication/Anticipated D/C date and plan/Code Status   DVT prophylaxis: Lovenox ordered. Code Status: Full Code.  Family Communication:  Disposition Plan: pending improvement   Medical Consultants:    ID  Subjective:   C/o pain in her lower legs  Objective:    Vitals:   02/16/19 0740 02/16/19 1553 02/16/19 2343 02/17/19 0741  BP: 104/79 104/69 104/68 119/81  Pulse: (!) 101 (!) 123 (!) 109 (!) 124  Resp: 16 16  16   Temp: 98.3 F (36.8 C) 99.6 F (37.6 C) 98.6 F (37 C) 99.2 F (37.3 C)  TempSrc: Oral Oral Oral Oral  SpO2: 100% 99% 100% 99%  Weight:      Height:        Intake/Output Summary (Last 24 hours) at 02/17/2019 1353 Last data filed at 02/17/2019 0849 Gross per 24 hour  Intake 1845.33 ml  Output 800 ml  Net 1045.33 ml   Filed Weights   02/14/19 2217  Weight: 63.5 kg    Exam: Had new  red tender nodules on b/l LE A+OX3, NAD Uncomfortable appearing with movement  Data Reviewed:   I have personally reviewed following labs and imaging studies:  Labs: Labs show the following:   Basic Metabolic Panel: Recent Labs  Lab 02/14/19 1336 02/15/19 0254 02/17/19 0700 02/17/19 0947  NA 134* 136 137 136  K 3.5 3.6 4.2 4.1  CL 100 105 106 104  CO2 20* 22 23 23   GLUCOSE 103* 90 102* 131*  BUN 6 7 <5* 6  CREATININE 0.62 0.61 0.56 0.50  CALCIUM 9.4 8.3* 8.7* 8.9  MG  --  1.7  --  1.9  PHOS  --  2.5  --   --    GFR Estimated Creatinine Clearance: 89.6 mL/min (by C-G formula based on SCr of 0.5 mg/dL). Liver Function Tests: Recent Labs  Lab 02/14/19 1336 02/15/19 0254 02/17/19 0700 02/17/19 0947  AST 47* 26 20 20   ALT 71* 49* 34 33  ALKPHOS 101 75 78 84  BILITOT 1.4* 1.0 0.7 0.5  PROT 8.4* 6.5 6.9 7.5  ALBUMIN 3.5 2.7* 2.5* 2.7*   No results for input(s): LIPASE, AMYLASE in the last 168 hours. No results for input(s): AMMONIA in the last 168 hours. Coagulation profile No results for input(s): INR, PROTIME in the last 168 hours.  CBC: Recent Labs  Lab 02/14/19 1336 02/15/19 0254 02/17/19 0700  WBC 16.9*  11.6* 13.7*  NEUTROABS 13.6*  --   --   HGB 11.6* 9.7* 9.7*  HCT 36.0 30.6* 29.3*  MCV 81.6 83.2 80.1  PLT 389 297 302   Cardiac Enzymes: No results for input(s): CKTOTAL, CKMB, CKMBINDEX, TROPONINI in the last 168 hours. BNP (last 3 results) No results for input(s): PROBNP in the last 8760 hours. CBG: No results for input(s): GLUCAP in the last 168 hours. D-Dimer: No results for input(s): DDIMER in the last 72 hours. Hgb A1c: No results for input(s): HGBA1C in the last 72 hours. Lipid Profile: No results for input(s): CHOL, HDL, LDLCALC, TRIG, CHOLHDL, LDLDIRECT in the last 72 hours. Thyroid function studies: No results for input(s): TSH, T4TOTAL, T3FREE, THYROIDAB in the last 72 hours.  Invalid input(s): FREET3 Anemia work up: No results  for input(s): VITAMINB12, FOLATE, FERRITIN, TIBC, IRON, RETICCTPCT in the last 72 hours. Sepsis Labs: Recent Labs  Lab 02/14/19 1334 02/14/19 1336 02/14/19 2212 02/15/19 0254 02/17/19 0700  PROCALCITON  --   --   --  <0.10  --   WBC  --  16.9*  --  11.6* 13.7*  LATICACIDVEN 1.0  --  1.4  --   --     Microbiology Recent Results (from the past 240 hour(s))  Blood culture (routine x 2)     Status: None (Preliminary result)   Collection Time: 02/15/19 12:57 AM   Specimen: BLOOD  Result Value Ref Range Status   Specimen Description BLOOD RIGHT ANTECUBITAL  Final   Special Requests   Final    BOTTLES DRAWN AEROBIC AND ANAEROBIC Blood Culture results may not be optimal due to an excessive volume of blood received in culture bottles   Culture   Final    NO GROWTH 2 DAYS Performed at [facility], [address] [place], [place] [address]    Report Status PENDING  Incomplete  Blood culture (routine x 2)     Status: None (Preliminary result)   Collection Time: 02/15/19 12:58 AM   Specimen: BLOOD RIGHT HAND  Result Value Ref Range Status   Specimen Description BLOOD RIGHT HAND  Final   Special Requests   Final    BOTTLES DRAWN AEROBIC AND ANAEROBIC Blood Culture results may not be optimal due to an excessive volume of blood received in culture bottles   Culture   Final    NO GROWTH 2 DAYS Performed at [facility], [address] [place], [place] [address]    Report Status PENDING  Incomplete  SARS Coronavirus 2 (CEPHEID- Performed in [facility] hospital lab), Hosp Order     Status: None   Collection Time: 02/15/19  1:27 AM   Specimen: Nasopharyngeal Swab  Result Value Ref Range Status   SARS Coronavirus 2 NEGATIVE NEGATIVE Final    Comment: (NOTE) If result is NEGATIVE SARS-CoV-2 target nucleic acids are NOT DETECTED. The SARS-CoV-2 RNA is generally detectable in upper and lower  respiratory specimens during the acute phase of infection. The lowest   concentration of SARS-CoV-2 viral copies this assay can detect is 250  copies / mL. A negative result does not preclude SARS-CoV-2 infection  and should not be used as the sole basis for treatment or other  patient management decisions.  A negative result may occur with  improper specimen collection / handling, submission of specimen other  than nasopharyngeal swab, presence of viral mutation(s) within the  areas targeted by this assay, and inadequate number of viral copies  (<  250 copies / mL). A negative result must be combined with clinical  observations, patient history, and epidemiological information. If result is POSITIVE SARS-CoV-2 target nucleic acids are DETECTED. The SARS-CoV-2 RNA is generally detectable in upper and lower  respiratory specimens dur ing the acute phase of infection.  Positive  results are indicative of active infection with SARS-CoV-2.  Clinical  correlation with patient history and other diagnostic information is  necessary to determine patient infection status.  Positive results do  not rule out bacterial infection or co-infection with other viruses. If result is PRESUMPTIVE POSTIVE SARS-CoV-2 nucleic acids MAY BE PRESENT.   A presumptive positive result was obtained on the submitted specimen  and confirmed on repeat testing.  While 2019 novel coronavirus  (SARS-CoV-2) nucleic acids may be present in the submitted sample  additional confirmatory testing may be necessary for epidemiological  and / or clinical management purposes  to differentiate between  SARS-CoV-2 and other Sarbecovirus currently known to infect humans.  If clinically indicated additional testing with an alternate test  methodology [phone]) is advised. The SARS-CoV-2 RNA is generally  detectable in upper and lower respiratory sp ecimens during the acute  phase of infection. The expected result is Negative. Fact Sheet for Patients:  StrictlyIdeas.no Fact Sheet  for Healthcare Providers: BankingDealers.co.za This test is not yet approved or cleared by the [place] FDA and has been authorized for detection and/or diagnosis of SARS-CoV-2 by FDA under an Emergency Use Authorization (EUA).  This EUA will remain in effect (meaning this test can be used) for the duration of the COVID-19 declaration under Section 564(b)(1) of the Act, 21 U.S.C. section 360bbb-3(b)(1), unless the authorization is terminated or revoked sooner. Performed at [facility], [place] [address], [place], [place] [address]     Procedures and diagnostic studies:  No results found.  Medications:   . enoxaparin (LOVENOX) injection  40 mg Subcutaneous Daily  . methylPREDNISolone (SOLU-MEDROL) injection  40 mg Intravenous Q8H  . sodium chloride flush  3 mL Intravenous Once   Continuous Infusions: . 0.9 % NaCl with KCl 20 mEq / L 75 mL/hr at 02/17/19 0307  . methocarbamol (ROBAXIN) IV 500 mg (02/17/19 0849)     LOS: 2 days   Geradine Girt  [facility]   How to contact the [facility] Attending or Consulting provider [place] or covering provider during after hours [place], for this patient?  1. Check the care team in [facility] and look for a) attending/consulting [facility] provider listed and b) the [facility] team listed 2. Log into [email or web address] and use [employer] universal password to access. If you do not have the password, please contact the hospital operator. 3. Locate the [facility] provider you are looking for under [facility] and page to a number that you can be directly reached. 4. If you still have difficulty reaching the provider, please page the [facility] (Director on Call) for the Hospitalists listed on amion for assistance.  02/17/2019, 1:53 PM

## 2019-02-17 NOTE — Progress Notes (Signed)
Patient has shown marked improvement in joint pain on steroids. She is able to get out of bed unassisted again and is often sitting up (and smiling). She reports feeling much better. Dr. Eliseo Squires updated.

## 2019-02-18 DIAGNOSIS — R739 Hyperglycemia, unspecified: Secondary | ICD-10-CM | POA: Diagnosis present

## 2019-02-18 LAB — CBC
HCT: 32.4 % — ABNORMAL LOW (ref 36.0–46.0)
Hemoglobin: 10.3 g/dL — ABNORMAL LOW (ref 12.0–15.0)
MCH: 25.8 pg — ABNORMAL LOW (ref 26.0–34.0)
MCHC: 31.8 g/dL (ref 30.0–36.0)
MCV: 81 fL (ref 80.0–100.0)
Platelets: 370 10*3/uL (ref 150–400)
RBC: 4 MIL/uL (ref 3.87–5.11)
RDW: 15.4 % (ref 11.5–15.5)
WBC: 16.2 10*3/uL — ABNORMAL HIGH (ref 4.0–10.5)
nRBC: 0 % (ref 0.0–0.2)

## 2019-02-18 LAB — BASIC METABOLIC PANEL
Anion gap: 10 (ref 5–15)
BUN: 8 mg/dL (ref 6–20)
CO2: 24 mmol/L (ref 22–32)
Calcium: 9.3 mg/dL (ref 8.9–10.3)
Chloride: 107 mmol/L (ref 98–111)
Creatinine, Ser: 0.39 mg/dL — ABNORMAL LOW (ref 0.44–1.00)
GFR calc Af Amer: >60 mL/min (ref >60)
GFR calc non Af Amer: >60 mL/min (ref >60)
Glucose, Bld: 139 mg/dL — ABNORMAL HIGH (ref 70–99)
Potassium: 4.2 mmol/L (ref 3.5–5.1)
Sodium: 141 mmol/L (ref 135–145)

## 2019-02-18 LAB — TSH: TSH: 0.491 u[IU]/mL (ref 0.350–4.500)

## 2019-02-18 MED ORDER — PANTOPRAZOLE SODIUM 40 MG PO TBEC
40.0000 mg | DELAYED_RELEASE_TABLET | Freq: Every day | ORAL | Status: DC
  Administered 2019-02-18 – 2019-02-19 (×2): 40 mg via ORAL
  Filled 2019-02-18 (×2): qty 1

## 2019-02-18 MED ORDER — PREDNISONE 20 MG PO TABS: 50.0000 mg | ORAL_TABLET | Freq: Every day | ORAL | Status: DC

## 2019-02-18 MED ORDER — PREDNISONE 20 MG PO TABS
60.0000 mg | ORAL_TABLET | Freq: Every day | ORAL | Status: DC
  Administered 2019-02-19: 60 mg via ORAL
  Filled 2019-02-18: qty 3

## 2019-02-18 NOTE — Progress Notes (Addendum)
[facility] PROGRESS NOTE  Erica Williamson [id number] DOB: 23-May-1991 DOA: 02/14/2019 PCP: Patient, No Pcp Per  Patient is a 28 year old female who has been worked up for cellulitis and induration of the left breast in the last couple of weeks along with a pneumonia.  Patient was discharged from this hospital on 01/31/2019.  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  Patient presents with fever, chills, nausea and vomiting on 7/30.     Assessment/Plan: Nausea and vomiting--  -Appears resolved this am. Tolerating breakfast -Supportive care for now. -monitor  Volume depletion: S/p IV fluids.  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  If steroids do not fix, consideration can be given to mastectomy (resection may not be curative).  He would also recommend follow up with [facility].  Referral being made.  Elevated LFTs: resolved this am. Cause unclear  Joint pain -improved with IV steroids -will transition to po steroids (see above for length of time)  Lower ext nodules  -. 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 Need to biopsy outpatient -Sed rate (109) and CRP (16.5)  -repeat in AM s/p steroids  Hyperglycemia: steroid induced -monitor  Code Status: full Family Communication: patient Disposition Plan: home hopefully tomorrow   Consultants:   ID   Antibiotics:  Zosyn 7/31 for 2 doses.  HPI/Subjective: Awake alert reports feeling "a lot better" states steroids have helped with the discomfort.   Objective: Vitals:   02/17/19 2324 02/18/19 0845  BP: 106/73 113/75  Pulse: 95 89  Resp: 16    Temp: (!) 97.5 F (36.4 C) (!) 97.5 F (36.4 C)  SpO2: 100% 99%    Intake/Output Summary (Last 24 hours) at 02/18/2019 1228 Last data filed at 02/18/2019 0620 Gross per 24 hour  Intake 5157.39 ml  Output 700 ml  Net 4457.39 ml   Filed Weights   02/14/19 2217  Weight: 63.5 kg    Exam:   General:  Awake alert no acute distress  Cardiovascular: tachycardia regular, no mgr no LE edema  Respiratory: normal effort BS clear bilaterally no wheeze  Abdomen: soft +BS no guarding or rebounding  Musculoskeletal: joints without swelling. Bilateral LE purplish nodules on shins and left great toe.   Data Reviewed: Basic Metabolic Panel: Recent Labs  Lab 02/14/19 1336 02/15/19 0254 02/17/19 0700 02/17/19 0947 02/18/19 0544  NA 134* 136 137 136 141  K 3.5 3.6 4.2 4.1 4.2  CL 100 105 106 104 107  CO2 20* 22 23 23 24   GLUCOSE 103* 90 102* 131* 139*  BUN 6 7 <5* 6 8  CREATININE 0.62 0.61 0.56 0.50 0.39*  CALCIUM 9.4 8.3* 8.7* 8.9 9.3  MG  --  1.7  --  1.9  --   PHOS  --  2.5  --   --   --    Liver Function Tests: Recent Labs  Lab 02/14/19 1336 02/15/19 0254 02/17/19 0700 02/17/19 0947  AST 47* 26 20 20   ALT 71* 49* 34 33  ALKPHOS 101 75 78 84  BILITOT 1.4* 1.0 0.7 0.5  PROT 8.4* 6.5 6.9 7.5  ALBUMIN 3.5 2.7* 2.5* 2.7*  No results for input(s): LIPASE, AMYLASE in the last 168 hours. No results for input(s): AMMONIA in the last 168 hours. CBC: Recent Labs  Lab 02/14/19 1336 02/15/19 0254 02/17/19 0700 02/18/19 0544  WBC 16.9* 11.6* 13.7* 16.2*  NEUTROABS 13.6*  --   --   --   HGB 11.6* 9.7* 9.7* 10.3*  HCT 36.0 30.6* 29.3* 32.4*  MCV 81.6 83.2 80.1 81.0  PLT 389 297 302 370   Cardiac Enzymes: No results for input(s): CKTOTAL, CKMB, CKMBINDEX, TROPONINI in the last 168 hours. BNP (last 3 results) No results for input(s): BNP in the last 8760 hours.  ProBNP (last 3 results) No results for input(s): PROBNP in the last 8760 hours.  CBG: No results for  input(s): GLUCAP in the last 168 hours.  Recent Results (from the past 240 hour(s))  Blood culture (routine x 2)     Status: None (Preliminary result)   Collection Time: 02/15/19 12:57 AM   Specimen: BLOOD  Result Value Ref Range Status   Specimen Description BLOOD RIGHT ANTECUBITAL  Final   Special Requests   Final    BOTTLES DRAWN AEROBIC AND ANAEROBIC Blood Culture results may not be optimal due to an excessive volume of blood received in culture bottles   Culture   Final    NO GROWTH 3 DAYS Performed at [facility], [address] [place], [place] [address]    Report Status PENDING  Incomplete  Blood culture (routine x 2)     Status: None (Preliminary result)   Collection Time: 02/15/19 12:58 AM   Specimen: BLOOD RIGHT HAND  Result Value Ref Range Status   Specimen Description BLOOD RIGHT HAND  Final   Special Requests   Final    BOTTLES DRAWN AEROBIC AND ANAEROBIC Blood Culture results may not be optimal due to an excessive volume of blood received in culture bottles   Culture   Final    NO GROWTH 3 DAYS Performed at [facility], [address] [place], [place] [address]    Report Status PENDING  Incomplete  SARS Coronavirus 2 (CEPHEID- Performed in [facility] hospital lab), Hosp Order     Status: None   Collection Time: 02/15/19  1:27 AM   Specimen: Nasopharyngeal Swab  Result Value Ref Range Status   SARS Coronavirus 2 NEGATIVE NEGATIVE Final    Comment: (NOTE) If result is NEGATIVE SARS-CoV-2 target nucleic acids are NOT DETECTED. The SARS-CoV-2 RNA is generally detectable in upper and lower  respiratory specimens during the acute phase of infection. The lowest  concentration of SARS-CoV-2 viral copies this assay can detect is 250  copies / mL. A negative result does not preclude SARS-CoV-2 infection  and should not be used as the sole basis for treatment or other  patient management decisions.  A negative result may occur with  improper specimen  collection / handling, submission of specimen other  than nasopharyngeal swab, presence of viral mutation(s) within the  areas targeted by this assay, and inadequate number of viral copies  (<250 copies / mL). A negative result must be combined with clinical  observations, patient history, and epidemiological information. If result is POSITIVE SARS-CoV-2 target nucleic acids are DETECTED. The SARS-CoV-2 RNA is generally detectable in upper and lower  respiratory specimens dur ing the acute phase of infection.  Positive  results are indicative of active infection with SARS-CoV-2.  Clinical  correlation with patient history and other diagnostic information is  necessary to  determine patient infection status.  Positive results do  not rule out bacterial infection or co-infection with other viruses. If result is PRESUMPTIVE POSTIVE SARS-CoV-2 nucleic acids MAY BE PRESENT.   A presumptive positive result was obtained on the submitted specimen  and confirmed on repeat testing.  While 2019 novel coronavirus  (SARS-CoV-2) nucleic acids may be present in the submitted sample  additional confirmatory testing may be necessary for epidemiological  and / or clinical management purposes  to differentiate between  SARS-CoV-2 and other Sarbecovirus currently known to infect humans.  If clinically indicated additional testing with an alternate test  methodology [phone]([id number]) is advised. The SARS-CoV-2 RNA is generally  detectable in upper and lower respiratory sp ecimens during the acute  phase of infection. The expected result is Negative. Fact Sheet for Patients:  BoilerBrush.com.cyhttps://www.fda.gov/media/136312/download Fact Sheet for Healthcare Providers: https://pope.com/https://www.fda.gov/media/136313/download This test is not yet approved or cleared by the [place] FDA and has been authorized for detection and/or diagnosis of SARS-CoV-2 by FDA under an Emergency Use Authorization (EUA).  This EUA will remain in effect  (meaning this test can be used) for the duration of the COVID-19 declaration under Section 564(b)(1) of the Act, 21 U.S.C. section 360bbb-3(b)(1), unless the authorization is terminated or revoked sooner. Performed at [facility], [address] [place], [place] [address]      Studies: No results found.  Scheduled Meds: . enoxaparin (LOVENOX) injection  40 mg Subcutaneous Daily  . predniSONE  50 mg Oral Q breakfast  . sodium chloride flush  3 mL Intravenous Once   Continuous Infusions: . methocarbamol (ROBAXIN) IV 500 mg (02/17/19 0849)    Principal Problem:   Plasma cell mastitis of left breast Active Problems:   Tachycardia   Nausea   Hyperglycemia   Normocytic anemia   Mastitis    Time spent: 45 minutes   Marlin CanaryJessica Deannie Resetar DO [facility]  If 7PM-7AM, please contact night-coverage at [email or web address], password [email or web address] 02/18/2019, 12:28 PM  LOS: 3 days

## 2019-02-18 NOTE — TOC Progression Note (Signed)
Transition of Care [facility]) - Progression Note    Patient Details  Name: Erica Williamson MRN: [id number] Date of Birth: [birth year]  Transition of Care [facility]) CM/SW Contact  Sharin Mons, RN Phone Number: 02/18/2019, 6:42 PM  Clinical Narrative:     NCM spoke with pt regarding TOC needs. Pt without PCP.... NCM rescheduled hospital f/u appointment @ [facility] for 02/27/2019 @ 10:30 am. [facility] to make dermatological referral @ clinic visit.  Pt states does have health insurance. States will be able to afford Rx meds. Pt with transportation to home, husband.  Dreonna Hussein (Spouse)     [phone]      NCM will continue to monitor for toc needs ....  Expected Discharge Plan: Home/Self Care Barriers to Discharge: Continued Medical Work up, Inadequate or no insurance  Expected Discharge Plan and Services Expected Discharge Plan: Home/Self Care   Discharge Planning Services: CM Consult                                           Social Determinants of Health (SDOH) Interventions    Readmission Risk Interventions No flowsheet data found.

## 2019-02-19 LAB — BASIC METABOLIC PANEL
Anion gap: 10 (ref 5–15)
BUN: 17 mg/dL (ref 6–20)
CO2: 25 mmol/L (ref 22–32)
Calcium: 9.4 mg/dL (ref 8.9–10.3)
Chloride: 106 mmol/L (ref 98–111)
Creatinine, Ser: 0.56 mg/dL (ref 0.44–1.00)
GFR calc Af Amer: >60 mL/min (ref >60)
GFR calc non Af Amer: >60 mL/min (ref >60)
Glucose, Bld: 105 mg/dL — ABNORMAL HIGH (ref 70–99)
Potassium: 3.6 mmol/L (ref 3.5–5.1)
Sodium: 141 mmol/L (ref 135–145)

## 2019-02-19 LAB — CBC
HCT: 31.9 % — ABNORMAL LOW (ref 36.0–46.0)
Hemoglobin: 10.5 g/dL — ABNORMAL LOW (ref 12.0–15.0)
MCH: 26.2 pg (ref 26.0–34.0)
MCHC: 32.9 g/dL (ref 30.0–36.0)
MCV: 79.6 fL — ABNORMAL LOW (ref 80.0–100.0)
Platelets: 412 10*3/uL — ABNORMAL HIGH (ref 150–400)
RBC: 4.01 MIL/uL (ref 3.87–5.11)
RDW: 15.5 % (ref 11.5–15.5)
WBC: 23.9 10*3/uL — ABNORMAL HIGH (ref 4.0–10.5)
nRBC: 0 % (ref 0.0–0.2)

## 2019-02-19 LAB — C-REACTIVE PROTEIN: CRP: 12.6 mg/dL — ABNORMAL HIGH (ref <1.0)

## 2019-02-19 LAB — SEDIMENTATION RATE: Sed Rate: 118 mm/hr — ABNORMAL HIGH (ref 0–22)

## 2019-02-19 MED ORDER — PREDNISONE 20 MG PO TABS: ORAL_TABLET | ORAL | 0 refills | Status: DC

## 2019-02-19 MED ORDER — PANTOPRAZOLE SODIUM 40 MG PO TBEC: 40.0000 mg | DELAYED_RELEASE_TABLET | Freq: Every day | ORAL | 2 refills | Status: DC

## 2019-02-19 MED FILL — PANTOPRAZOLE SOD DR 40 MG T: 40 | 30 days supply | Qty: 30 | Fill #0

## 2019-02-19 MED FILL — predniSONE 20 MG TABS: 20 | 90 days supply | Qty: 105 | Fill #0

## 2019-02-19 NOTE — Discharge Summary (Addendum)
Physician Discharge Summary  Erica RisingSonia Sadaf Williamson VWU:[id number] DOB: 12-14-1990 DOA: 02/14/2019  PCP: Patient, No Pcp Per  Admit date: 02/14/2019 Discharge date: 02/19/2019  Time spent: 45 minutes  Recommendations for Outpatient Follow-up:  1. Follow up with PCP as scheduled. Recommend cbg to follow steroid induced hyperglycemia 2. Referral [facility] to [facility]   Discharge Diagnoses:  Principal Problem:   Plasma cell mastitis of left breast Active Problems:   Tachycardia   Nausea   Hyperglycemia   Normocytic anemia   Mastitis   Discharge Condition: stable  Diet recommendation: regular  Filed Weights   02/14/19 2217  Weight: 63.5 kg    History of present illness:   Patient is a 28 year old female who has been worked up for cellulitis and induration of the left breast in the last couple of weeks.  Patient was discharged from [facility] on 01/31/2019, with the plan being for the patient to follow-up with the infectious disease team.  Patient was seen in consultation by the surgical team on 01/26/2019.  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  Patient presented 7/31 with fever, chills, nausea and vomiting.  According to the patient, she had not been able to keep anything down in the previous 2 days.  Last nausea vomiting was about 12 hours prior.  Hospitalist team has been asked to admit patient due to volume depletion and concerns that patient is not keeping down her oral antibiotics, Augmentin.  No headache, no neck pain, no shortness of breath, no chest pain, no urinary symptoms.    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 AFB stains negative per ID report -discussed with a breast surgeon who recommended a trial of steroids -- high dose with taper to 20 mg with continuation of this for 3 months.  If steroids do not fix, consideration can be given to  mastectomy (resection may not be curative).  He would also recommend follow up with [facility].  Referral made. -will give PPI while taking steroids  Elevated LFTs: resolved. Cause unclear  Joint pain. Resolved at discharge. Steroids as noted above  Lower ext nodules . 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 Need to biopsy outpatient.  Nausea and vomiting-- resolved at discharge.   Volume depletion: resolved at discharge    Consultations:  [facility] ID  Discharge Exam: Vitals:   02/18/19 2000 02/19/19 0749  BP: 111/80 (!) 95/56  Pulse: 93 73  Resp: 18   Temp: 97.8 F (36.6 C) 97.6 F (36.4 C)  SpO2: 99% 100%    General: sitting up in bed eating breakfast. smiling Cardiovascular: rrr no mgr no LE edema Respiratory: normal effort BS clear bilaterally Skin: left breast tender with minimal swelling/erythema. Remains tender. Bilateral shins with purplish nodules  Discharge Instructions   Discharge Instructions    Diet - low sodium heart healthy   Complete by: As directed    Discharge instructions   Complete by: As directed    Follow up with PCP as scheduled Call [facility] as instructed for appointment Take medications as prescribed   Increase activity slowly   Complete by: As directed      Allergies as of 02/19/2019   No Known Allergies     Medication List    STOP taking these medications   amoxicillin-clavulanate 600-42.9 MG/5ML suspension Commonly known as: AUGMENTIN     TAKE these  medications   ondansetron 4 MG disintegrating tablet Commonly known as: Zofran ODT Take 1 tablet (4 mg total) by mouth every 8 (eight) hours as needed for nausea or vomiting.   pantoprazole 40 MG tablet Commonly known as: PROTONIX Take 1 tablet (40 mg total) by mouth daily. Start taking on: February 20, 2019   predniSONE 20 MG tablet Commonly known as: Deltasone Take 3 tabs for 5 days take 2 tabs for 5 days then take one tab for 80  days      No Known Allergies Follow-up Information    [facility] Follow up on 02/27/2019.   Why:  Hospital follow up scheduled for 2:30 pm with [name] PA.  Contact information: [address] [place] [id number] [phone]       [facility] should be making a follow up apointment-should hear something by Thursday (if not,call [phone]) Follow up.            The results of significant diagnostics from this hospitalization (including imaging, microbiology, ancillary and laboratory) are listed below for reference.    Significant Diagnostic Studies: Dg Chest 2 View  Result Date: 02/14/2019 CLINICAL DATA:  Fever.  Vomiting. EXAM: CHEST - 2 VIEW COMPARISON:  01/26/2019 FINDINGS: The heart size and mediastinal contours are within normal limits. Both lungs are clear. The visualized skeletal structures are unremarkable. IMPRESSION: Normal exam.  The clearing of the infiltrates since the prior study. Electronically Signed   By: Francene BoyersJames  Maxwell M.D.   On: 02/14/2019 14:37   Dg Chest 2 View  Result Date: 01/26/2019 CLINICAL DATA:  Community acquired pneumonia EXAM: CHEST - 2 VIEW COMPARISON:  01/25/2019 FINDINGS: Slight interval increase in small bilateral pleural effusions and associated bibasilar atelectasis or consolidation. No new airspace opacity. IMPRESSION: Slight interval increase in small bilateral pleural effusions and associated bibasilar atelectasis or consolidation. No new airspace opacity. Electronically Signed   By: Lauralyn PrimesAlex  Bibbey M.D.   On: 01/26/2019 11:12   Ct Angio Chest Pe W Or Wo Contrast  Result Date: 01/28/2019 CLINICAL DATA:  Shortness of breath EXAM: CT ANGIOGRAPHY CHEST WITH CONTRAST TECHNIQUE: Multidetector CT imaging of the chest was performed using the standard protocol during bolus administration of intravenous contrast. Multiplanar CT image reconstructions and MIPs were obtained to evaluate the  vascular anatomy. CONTRAST:  50mL OMNIPAQUE IOHEXOL 350 MG/ML SOLN COMPARISON:  Chest CT January 25, 2019; chest radiograph January 26, 2019 FINDINGS: Cardiovascular: There is no demonstrable pulmonary embolus. There is no appreciable thoracic aortic aneurysm or dissection. Visualized great vessels appear unremarkable. There is a small pericardial effusion which may be within physiologic range. The pericardium does not appear thickened. Mediastinum/Nodes: Visualized thyroid appears unremarkable. There is no appreciable thoracic adenopathy. No esophageal lesions are evident. Lungs/Pleura: There are moderate free-flowing pleural effusions bilaterally. There is consolidation in both lower lobes. There is mild atelectatic change in each upper lobe region. Upper Abdomen: Visualized upper abdominal structures appear unremarkable. Musculoskeletal: There remains extensive soft tissues/subcutaneous thickening throughout the left breast with diffuse edema within the left breast consistent with mastitis. No blastic or lytic bone lesions are evident. Review of the MIP images confirms the above findings. IMPRESSION: 1. No demonstrable pulmonary embolus. No thoracic aortic aneurysm or dissection. 2. Bilateral free-flowing pleural effusions. Extensive bilateral lower lobe airspace consolidation. There is mild upper lobe atelectatic change bilaterally. 3. Left breast edema and apparent cellulitis consistent with mastitis. This finding has been noted previously. 4.  No appreciable  adenopathy. 5. Small pericardial effusion which may be within physiologic range. Electronically Signed   By: Lowella Grip III M.D.   On: 01/28/2019 14:18   Nm Pulmonary Perfusion  Result Date: 01/26/2019 CLINICAL DATA:  Respiratory distress and elevated D-dimer EXAM: NUCLEAR MEDICINE PERFUSION LUNG SCAN VIEWS: Anterior, posterior, left lateral, right lateral, RPO, LPO, RAO, LAO RADIOPHARMACEUTICALS:  1.5 mCi Tc-21m MAA IV COMPARISON:  Chest radiograph  January 26, 2019 FINDINGS: Radiotracer uptake is homogeneous and symmetric bilaterally. No perfusion defects are demonstrable. IMPRESSION: No appreciable perfusion defects. Very low probability of pulmonary embolus. Electronically Signed   By: Lowella Grip III M.D.   On: 01/26/2019 15:33    Microbiology: Recent Results (from the past 240 hour(s))  Blood culture (routine x 2)     Status: None (Preliminary result)   Collection Time: 02/15/19 12:57 AM   Specimen: BLOOD  Result Value Ref Range Status   Specimen Description BLOOD RIGHT ANTECUBITAL  Final   Special Requests   Final    BOTTLES DRAWN AEROBIC AND ANAEROBIC Blood Culture results may not be optimal due to an excessive volume of blood received in culture bottles   Culture   Final    NO GROWTH 4 DAYS Performed at [facility], [place] [address] [place], [place] [address]    Report Status PENDING  Incomplete  Blood culture (routine x 2)     Status: None (Preliminary result)   Collection Time: 02/15/19 12:58 AM   Specimen: BLOOD RIGHT HAND  Result Value Ref Range Status   Specimen Description BLOOD RIGHT HAND  Final   Special Requests   Final    BOTTLES DRAWN AEROBIC AND ANAEROBIC Blood Culture results may not be optimal due to an excessive volume of blood received in culture bottles   Culture   Final    NO GROWTH 4 DAYS Performed at [facility], [place] [address], [place], [place] [address]    Report Status PENDING  Incomplete  SARS Coronavirus 2 (CEPHEID- Performed in [place] hospital lab), Hosp Order     Status: None   Collection Time: 02/15/19  1:27 AM   Specimen: Nasopharyngeal Swab  Result Value Ref Range Status   SARS Coronavirus 2 NEGATIVE NEGATIVE Final    Comment: (NOTE) If result is NEGATIVE SARS-CoV-2 target nucleic acids are NOT DETECTED. The SARS-CoV-2 RNA is generally detectable in upper and lower  respiratory specimens during the acute phase of infection. The lowest  concentration of SARS-CoV-2  viral copies this assay can detect is 250  copies / mL. A negative result does not preclude SARS-CoV-2 infection  and should not be used as the sole basis for treatment or other  patient management decisions.  A negative result may occur with  improper specimen collection / handling, submission of specimen other  than nasopharyngeal swab, presence of viral mutation(s) within the  areas targeted by this assay, and inadequate number of viral copies  (<250 copies / mL). A negative result must be combined with clinical  observations, patient history, and epidemiological information. If result is POSITIVE SARS-CoV-2 target nucleic acids are DETECTED. The SARS-CoV-2 RNA is generally detectable in upper and lower  respiratory specimens dur ing the acute phase of infection.  Positive  results are indicative of active infection with SARS-CoV-2.  Clinical  correlation with patient history and other diagnostic information is  necessary to determine patient infection status.  Positive results do  not rule out bacterial infection or co-infection with other  viruses. If result is PRESUMPTIVE POSTIVE SARS-CoV-2 nucleic acids MAY BE PRESENT.   A presumptive positive result was obtained on the submitted specimen  and confirmed on repeat testing.  While 2019 novel coronavirus  (SARS-CoV-2) nucleic acids may be present in the submitted sample  additional confirmatory testing may be necessary for epidemiological  and / or clinical management purposes  to differentiate between  SARS-CoV-2 and other Sarbecovirus currently known to infect humans.  If clinically indicated additional testing with an alternate test  methodology [phone]([id number]) is advised. The SARS-CoV-2 RNA is generally  detectable in upper and lower respiratory sp ecimens during the acute  phase of infection. The expected result is Negative. Fact Sheet for Patients:  BoilerBrush.com.cyhttps://www.fda.gov/media/136312/download Fact Sheet for Healthcare  Providers: https://pope.com/https://www.fda.gov/media/136313/download This test is not yet approved or cleared by the [place] FDA and has been authorized for detection and/or diagnosis of SARS-CoV-2 by FDA under an Emergency Use Authorization (EUA).  This EUA will remain in effect (meaning this test can be used) for the duration of the COVID-19 declaration under Section 564(b)(1) of the Act, 21 U.S.C. section 360bbb-3(b)(1), unless the authorization is terminated or revoked sooner. Performed at [facility], [address] [place], [place] [address]      Labs: Basic Metabolic Panel: Recent Labs  Lab 02/15/19 0254 02/17/19 0700 02/17/19 0947 02/18/19 0544 02/19/19 0706  NA 136 137 136 141 141  K 3.6 4.2 4.1 4.2 3.6  CL 105 106 104 107 106  CO2 22 23 23 24 25   GLUCOSE 90 102* 131* 139* 105*  BUN 7 <5* 6 8 17   CREATININE 0.61 0.56 0.50 0.39* 0.56  CALCIUM 8.3* 8.7* 8.9 9.3 9.4  MG 1.7  --  1.9  --   --   PHOS 2.5  --   --   --   --    Liver Function Tests: Recent Labs  Lab 02/14/19 1336 02/15/19 0254 02/17/19 0700 02/17/19 0947  AST 47* 26 20 20   ALT 71* 49* 34 33  ALKPHOS 101 75 78 84  BILITOT 1.4* 1.0 0.7 0.5  PROT 8.4* 6.5 6.9 7.5  ALBUMIN 3.5 2.7* 2.5* 2.7*   No results for input(s): LIPASE, AMYLASE in the last 168 hours. No results for input(s): AMMONIA in the last 168 hours. CBC: Recent Labs  Lab 02/14/19 1336 02/15/19 0254 02/17/19 0700 02/18/19 0544 02/19/19 0706  WBC 16.9* 11.6* 13.7* 16.2* 23.9*  NEUTROABS 13.6*  --   --   --   --   HGB 11.6* 9.7* 9.7* 10.3* 10.5*  HCT 36.0 30.6* 29.3* 32.4* 31.9*  MCV 81.6 83.2 80.1 81.0 79.6*  PLT 389 297 302 370 412*   Cardiac Enzymes: No results for input(s): CKTOTAL, CKMB, CKMBINDEX, TROPONINI in the last 168 hours. BNP: BNP (last 3 results) No results for input(s): BNP in the last 8760 hours.  ProBNP (last 3 results) No results for input(s): PROBNP in the last 8760 hours.  CBG: No results for  input(s): GLUCAP in the last 168 hours.     Signed:  Marlin CanaryJessica Floree Zuniga DO [facility] 02/19/2019, 9:53 AM

## 2019-02-20 ENCOUNTER — Inpatient Hospital Stay: Payer: Self-pay | Admitting: Family Medicine

## 2019-02-20 LAB — CULTURE, BLOOD (ROUTINE X 2)
Culture: NO GROWTH
Culture: NO GROWTH

## 2019-02-26 NOTE — Progress Notes (Signed)
Patient ID: Erica Williamson, female   DOB: 04-22-1991, 28 y.o.   MRN: [id number]  Virtual Visit via Telephone Note  I connected with Erica Williamson on 02/27/19 at  2:30 PM EDT by telephone and verified that I am speaking with the correct person using two identifiers.   I discussed the limitations, risks, security and privacy concerns of performing an evaluation and management service by telephone and the availability of in person appointments. I also discussed with the patient that there may be a patient responsible charge related to this service. The patient expressed understanding and agreed to proceed.  Patient location:  home My Location:  [facility] office Persons on the call:  The patient and myself   History of Present Illness: After hospitalization 7/30-8/10/2018.  LMP 7/28.  Not pregnant. Still having diffuse joint pain.  No fever.  Still on prednisone.  Appetite is good.  No melena.  No hematochezia.  No abdominal pain.  Occasional nausea.  From discharge summary:  History of present illness:  Patient is a 28 year old female who has been worked up for cellulitis and induration of the left breast in the last couple of weeks. Patient was discharged from [facility] on 01/31/2019, with the plan being for the patient to follow-up with the infectious disease team. Patient was seen in consultation by the surgical team on 01/26/2019. 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 Patient presented 7/31 with fever, chills, nausea and vomiting. According to the patient, she had not been able to keep anything down in the previous 2 days. Last nausea vomiting was about 12 hours prior. Hospitalist team has been asked to admit patient due to volume depletion and concerns that patient is not keeping down her oral antibiotics, Augmentin. No headache, no neck pain, no shortness of breath, no chest pain, no urinary symptoms.   Hospital Course:    Plasma cell mastitis of the left breast: evaluated by ID who opine no need for further abx. AFB stains negative per ID report -discussed with a breast surgeon who recommended a trial of steroids -- high dose with taper to 20 mg with continuation of this for 3 months. If steroids do not fix, consideration can be given to mastectomy (resection may not be curative). He would also recommend follow up with [facility]. Referral made. -will give PPI while taking steroids  Elevated LFTs:resolved. Cause unclear  Joint pain. Resolved at discharge. Steroids as noted above  Lower ext nodules. 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 Need to biopsy outpatient.  Nausea and vomiting--resolved at discharge.   Volume depletion: resolved at discharge    Observations/Objective: A&Ox3   Assessment and Plan: 1. Plasma cell mastitis of left breast For joint pain.  Hopefully prednisone will continue to help.  She has appt with [facility] on 8/19.  Rheumatology appt still in process.   - traMADol (ULTRAM) 50 MG tablet; Take 1 tablet (50 mg total) by mouth every 8 (eight) hours as needed for up to 5 days.  Dispense: 15 tablet; Refill: 0  2. Hospital discharge follow-up improving    Follow Up Instructions: Assign PCP in 1 month   I discussed the assessment and treatment plan with the patient. The patient was provided an opportunity to ask questions and all were answered. The patient agreed with the plan and demonstrated an understanding of the instructions.   The patient was advised to call back or seek an in-person evaluation if the  symptoms worsen or if the condition fails to improve as anticipated.  I provided 12 minutes of non-face-to-face time during this encounter.   Freeman Caldron, PA-C

## 2019-02-27 ENCOUNTER — Inpatient Hospital Stay (INDEPENDENT_AMBULATORY_CARE_PROVIDER_SITE_OTHER): Payer: Self-pay | Admitting: Primary Care

## 2019-02-27 ENCOUNTER — Other Ambulatory Visit: Payer: Self-pay

## 2019-02-27 ENCOUNTER — Ambulatory Visit: Payer: BC Managed Care – PPO | Attending: Family Medicine | Admitting: Physician Assistant

## 2019-02-27 DIAGNOSIS — N6042 Mammary duct ectasia of left breast: Secondary | ICD-10-CM

## 2019-02-27 DIAGNOSIS — Z09 Encounter for follow-up examination after completed treatment for conditions other than malignant neoplasm: Secondary | ICD-10-CM

## 2019-02-27 MED ORDER — TRAMADOL HCL 50 MG PO TABS: 50.0000 mg | ORAL_TABLET | Freq: Three times a day (TID) | ORAL | 0 refills | Status: AC | PRN

## 2019-02-27 NOTE — Progress Notes (Signed)
Pt. Is following up for HFU. Pt. Stated she is having pain in her joint.

## 2019-03-04 ENCOUNTER — Telehealth: Payer: Self-pay

## 2019-03-04 NOTE — Telephone Encounter (Signed)
I would suggest that someone see here this week in office

## 2019-03-04 NOTE — Telephone Encounter (Signed)
McClung prescribed Pain meds that did not help. Hospitalist Ref pt to [facility] but [facility] says diagnosis is not related reumatology that pt needs ref to hematology. where should she go next?   Pt ph [phone]. Pt needs to est care.

## 2019-03-06 ENCOUNTER — Ambulatory Visit (INDEPENDENT_AMBULATORY_CARE_PROVIDER_SITE_OTHER): Payer: BC Managed Care – PPO | Admitting: Infectious Diseases

## 2019-03-06 ENCOUNTER — Telehealth: Payer: Self-pay | Admitting: Infectious Diseases

## 2019-03-06 ENCOUNTER — Other Ambulatory Visit: Payer: Self-pay

## 2019-03-06 DIAGNOSIS — M2550 Pain in unspecified joint: Secondary | ICD-10-CM

## 2019-03-06 DIAGNOSIS — N6042 Mammary duct ectasia of left breast: Secondary | ICD-10-CM | POA: Diagnosis not present

## 2019-03-06 NOTE — Assessment & Plan Note (Signed)
I suggested she for short 3 to 5-day span go back to the 60 mg of prednisone/day and then taper to 40 mg again. This time instead of a 5-day period would continue for 10 days then try to decrease to 20 mg daily until follow up with hematology. The plan as she understands it will be to continue prednisone for 3 months; if refractory then will require mastectomy.   No further follow up here recommended at this time. I wished her well.

## 2019-03-06 NOTE — Assessment & Plan Note (Signed)
Seems to be related to the underlying inflammatory process. Previously thought to be arthritis in the setting of possible viral gastroenteritis; however I am not sure that was the case. Defer to hematology for ultimate plan for duration/dose of prednisone for maintenance.

## 2019-03-06 NOTE — Progress Notes (Signed)
Name: Erica Williamson Date of Birth: 1991-04-29 MRN: [id number]  PCP: Patient, No Pcp Per     Virtual Visit via Telephone Note I connected with Erica Williamson on 03/06/19 at  2:30 PM EDT by a video enabled telemedicine application and verified that I am speaking with the correct person using two identifiers.  Location: Patient: home Provider: [facility] Office    I discussed the limitations of evaluation and management by telemedicine and the availability of in person appointments. The patient expressed understanding and agreed to proceed.   Patient Active Problem List   Diagnosis Date Noted   Polyarthralgia 03/06/2019   Hyperglycemia 02/18/2019   Plasma cell mastitis of left breast 02/15/2019   Tachycardia 02/15/2019   Nausea 02/15/2019   Transient hypotension 01/26/2019   Normocytic anemia 01/26/2019    History of Present Illness: Erica Williamson describes the condition of her left breast to be about the same as when she left from the hospital.  It still a bit swollen but nonpainful.  The wound from the biopsy site has healed completely without concern for infection.  She has been fever free since discharge home.  The biggest concern she has today is since weaning her prednisone down to 20 mg daily she has noticed a severe increase in severity of polyarthralgia.  She notes that it is more severe in her legs now but she does feel the pain in all hand, shoulder, jaw, feet ankle pain.  The rash she had over the lower legs has resolved.  [facility] is [facility] team and they declined to see her citing that the condition she has should be monitored by hematology.  She is asking for help with coordinating a referral there.  She reports her pain to be a 7 out of 10 consistently.  No other over-the-counter medications seems to help the pain.    Observations/Objective: Constitutional: Erica Williamson sounds to be frustrated with the current condition over the phone.  She is breathing  normally and conversing easily without shortness of breath.  Understandably frustrated.   Assessment and Plan: Problem List Items Addressed This Visit      Unprioritized   Plasma cell mastitis of left breast - Primary    I suggested she for short 3 to 5-day span go back to the 60 mg of prednisone/day and then taper to 40 mg again. This time instead of a 5-day period would continue for 10 days then try to decrease to 20 mg daily until follow up with hematology. The plan as she understands it will be to continue prednisone for 3 months; if refractory then will require mastectomy.   No further follow up here recommended at this time. I wished her well.        Relevant Orders   Ambulatory referral to Hematology   Polyarthralgia    Seems to be related to the underlying inflammatory process. Previously thought to be arthritis in the setting of possible viral gastroenteritis; however I am not sure that was the case. Defer to hematology for ultimate plan for duration/dose of prednisone for maintenance.       Relevant Orders   Ambulatory referral to Hematology       Follow Up Instructions: No further follow up recommended with ID. Referral made to hematology to manage chronic non-infectious condition.    I discussed the assessment and treatment plan with the patient. The patient was provided an opportunity to ask questions and all were answered. The patient agreed with the  plan and demonstrated an understanding of the instructions.   The patient was advised to call back or seek an in-person evaluation if the symptoms worsen or if the condition fails to improve as anticipated.  I provided 11 minutes of non-face-to-face time during this encounter.   Janene Madeira, MSN, NP-C [facility] for Infectious Disease [email or web address] Pager: [phone] Office: [phone] [place]: [phone]

## 2019-03-07 ENCOUNTER — Ambulatory Visit: Payer: Self-pay

## 2019-03-07 ENCOUNTER — Telehealth: Payer: Self-pay | Admitting: General Practice

## 2019-03-07 NOTE — Telephone Encounter (Signed)
Patient states she did not want to schedule due to still deciding who she would like to establish care with

## 2019-03-07 NOTE — Telephone Encounter (Signed)
-----   Message from [place], [place] sent at 03/06/2019  5:14 PM EDT ----- Return in about 1 month (around 03/30/2019) for assign PCP.

## 2019-03-13 ENCOUNTER — Telehealth: Payer: Self-pay | Admitting: Family Medicine

## 2019-03-13 ENCOUNTER — Encounter: Payer: Self-pay | Admitting: Family Medicine

## 2019-03-13 ENCOUNTER — Ambulatory Visit: Payer: BC Managed Care – PPO | Attending: Family Medicine | Admitting: Family Medicine

## 2019-03-13 ENCOUNTER — Other Ambulatory Visit: Payer: Self-pay

## 2019-03-13 VITALS — BP 114/77 | HR 104 | Temp 98.5°F | Ht 64.0 in | Wt 146.4 lb

## 2019-03-13 DIAGNOSIS — N6042 Mammary duct ectasia of left breast: Secondary | ICD-10-CM | POA: Diagnosis not present

## 2019-03-13 DIAGNOSIS — Z7952 Long term (current) use of systemic steroids: Secondary | ICD-10-CM

## 2019-03-13 DIAGNOSIS — Z131 Encounter for screening for diabetes mellitus: Secondary | ICD-10-CM | POA: Diagnosis not present

## 2019-03-13 DIAGNOSIS — M2550 Pain in unspecified joint: Secondary | ICD-10-CM | POA: Diagnosis not present

## 2019-03-13 LAB — POCT GLYCOSYLATED HEMOGLOBIN (HGB A1C): HbA1c, POC (controlled diabetic range): 5.3 % (ref 0.0–7.0)

## 2019-03-13 MED ORDER — CONTOUR NEXT MONITOR W/DEVICE KIT: 1.0000 | PACK | Freq: Every day | 0 refills | Status: AC

## 2019-03-13 MED ORDER — CONTOUR NEXT TEST VI STRP: ORAL_STRIP | 12 refills | Status: AC

## 2019-03-13 NOTE — Progress Notes (Signed)
Subjective:  Patient ID: Erica Williamson, female    DOB: [birth year]  Age: 28 y.o. MRN: [id number]  CC: Establish Care   HPI Erica RisingSonia Williamson Erica Williamson is a 28 year old female who presents with her husband to establish care.  She has a diagnosis of plasma cell mastitis (from pathology reports at [facility] [place]).  This was initially treated as cellulitis with different rounds of antibiotic however during her most recent hospitalization at [facility] she was commenced on steroids per surgery recommendation and if uncontrolled consideration for mastectomy. She also developed arthralgias which improved with steroids.  Her last hospitalization was from 7/30 to 02/19/2019 [facility] for nausea and vomiting.  Since discharge she has been seen for hospital follow-up by the PA 2 weeks ago, and last week by infectious disease NP. Currently on prednisone taper and was supposed to be on 20 mg daily however she still had difficulty ambulating and was advised to increase prednisone to 40 mg by infectious disease NP.  She is able to walk now but is concerned about prolonged use of steroids.  She was referred to [facility] rheumatology who declined referral and recommended hematology referral.  I have reviewed notes in her chart and biopsy reports have been requested by [facility]. She denies presence of facial rash, fever, swelling of her joints but does have pain in both hips, both ankles, both knees, both hands and difficulty ambulating which is controlled on her dose of prednisone 40 mg.  Denies family history of autoimmune disorder. Her husband is concerned about the slow progress with regards to visit with the specialist.  I have provided him with phone number and fax number to [facility] hemat[facility].  History reviewed. No pertinent past medical history.  Past Surgical History:  Procedure Laterality Date  . CESAREAN SECTION     X2    Family History  Problem Relation Age of Onset  . Diabetes Neg  Hx     No Known Allergies  Outpatient Medications Prior to Visit  Medication Sig Dispense Refill  . predniSONE (DELTASONE) 20 MG tablet Take 3 tabs for 5 days take 2 tabs for 5 days then take one tab for 80 days 105 tablet 0  . ondansetron (ZOFRAN ODT) 4 MG disintegrating tablet Take 1 tablet (4 mg total) by mouth every 8 (eight) hours as needed for nausea or vomiting. (Patient not taking: Reported on 03/13/2019) 30 tablet 0  . pantoprazole (PROTONIX) 40 MG tablet Take 1 tablet (40 mg total) by mouth daily. (Patient not taking: Reported on 03/13/2019) 30 tablet 2   No facility-administered medications prior to visit.      ROS Review of Systems  Constitutional: Negative for activity change, appetite change and fatigue.  HENT: Negative for congestion, sinus pressure and sore throat.   Eyes: Negative for visual disturbance.  Respiratory: Negative for cough, chest tightness, shortness of breath and wheezing.   Cardiovascular: Negative for chest pain and palpitations.  Gastrointestinal: Negative for abdominal distention, abdominal pain and constipation.  Endocrine: Negative for polydipsia.  Genitourinary: Negative for dysuria and frequency.  Musculoskeletal:       See HPI  Skin: Negative for rash.  Neurological: Negative for tremors, light-headedness and numbness.  Hematological: Does not bruise/bleed easily.  Psychiatric/Behavioral: Negative for agitation and behavioral problems.    Objective:  BP 114/77   Pulse (!) 104   Temp 98.5 F (36.9 C) (Oral)   Ht 5\' 4"  (1.626 m)   Wt 146 lb 6.4 oz (  66.4 kg)   LMP 02/13/2019 (Approximate)   SpO2 99%   BMI 25.13 kg/m   BP/Weight 03/13/2019 02/19/2019 02/14/2019  Systolic BP 114 95 -  Diastolic BP 77 56 -  Wt. (Lbs) 146.4 - 140  BMI 25.13 - 24.03      Physical Exam Constitutional:      Appearance: She is well-developed.  Cardiovascular:     Rate and Rhythm: Normal rate.     Heart sounds: Normal heart sounds. No murmur.   Pulmonary:     Effort: Pulmonary effort is normal.     Breath sounds: Normal breath sounds. No wheezing or rales.  Chest:     Chest wall: No tenderness.     Breasts:        Right: Normal. No swelling or mass.        Left: Mass (on lateral aspect of left breast) and skin change (hyperemic and hyperpigmented patches on lateral aspect of left breast) present.  Abdominal:     General: Bowel sounds are normal. There is no distension.     Palpations: Abdomen is soft. There is no mass.     Tenderness: There is no abdominal tenderness.  Musculoskeletal:     Comments: Normal appearance of all joints Tenderness on range of motion of bilateral ankles and hips  Neurological:     Mental Status: She is alert and oriented to person, place, and time.     CMP Latest Ref Rng & Units 02/19/2019 02/18/2019 02/17/2019  Glucose 70 - 99 mg/dL 553(Z) 482(L) 078(M)  BUN 6 - 20 mg/dL 17 8 6   Creatinine 0.44 - 1.00 mg/dL 7.54 4.92(E) 1.00  Sodium 135 - 145 mmol/L 141 141 136  Potassium 3.5 - 5.1 mmol/L 3.6 4.2 4.1  Chloride 98 - 111 mmol/L 106 107 104  CO2 22 - 32 mmol/L 25 24 23   Calcium 8.9 - 10.3 mg/dL 9.4 9.3 8.9  Total Protein 6.5 - 8.1 g/dL - - 7.5  Total Bilirubin 0.3 - 1.2 mg/dL - - 0.5  Alkaline Phos 38 - 126 U/L - - 84  AST 15 - 41 U/L - - 20  ALT 0 - 44 U/L - - 33    Lipid Panel     Component Value Date/Time   TRIG 93 01/26/2019 0621    CBC    Component Value Date/Time   WBC 23.9 (H) 02/19/2019 0706   RBC 4.01 02/19/2019 0706   HGB 10.5 (L) 02/19/2019 0706   HCT 31.9 (L) 02/19/2019 0706   HCT 26.4 (L) 01/29/2019 1201   PLT 412 (H) 02/19/2019 0706   MCV 79.6 (L) 02/19/2019 0706   MCH 26.2 02/19/2019 0706   MCHC 32.9 02/19/2019 0706   RDW 15.5 02/19/2019 0706   LYMPHSABS 1.9 02/14/2019 1336   MONOABS 1.2 (H) 02/14/2019 1336   EOSABS 0.1 02/14/2019 1336   BASOSABS 0.1 02/14/2019 1336    Lab Results  Component Value Date   HGBA1C 5.3 03/13/2019    Assessment & Plan:   1.  Plasma cell mastitis of left breast Breast is asymptomatic Currently on Prednisone 40mg  Awaiting appointment with [facility] hematology - Ambulatory referral to General Surgery  2. Screening for diabetes mellitus A1c is normal at 5.3 We will prescribe glucometer given she is on long-term steroid therapy so she can keep a log of her blood sugars - POCT glycosylated hemoglobin (Hb A1C)  3. Arthralgia, unspecified joint Unknown etiology We will order autoimmune labs - ANA,IFA RA Diag Pnl w/rflx Tit/Patn -  Rheumatoid factor - CYCLIC CITRUL PEPTIDE ANTIBODY, IGG/IGA - Anti-DNA antibody, double-stranded - Anti-Smooth Muscle Ab by IFA - Lupus anticoagulant - C3 and C4 - Complement, total    No orders of the defined types were placed in this encounter.   Follow-up: Return in about 1 month (around 04/13/2019) for coordination of care.       Charlott Rakes, MD, FAAFP. [facility] [place], [place]   03/13/2019, 12:26 PM

## 2019-03-13 NOTE — Telephone Encounter (Signed)
Erica Williamson, can you please discuss blood sugar checks with this patient which I did not get to during her visit (daily will suffice) She is on long term steroid therapy, A1c was 5.3- no h/o DM we will just need to keep an eye on her sugars to notice progression towards preDM or DM. Thanks.

## 2019-03-13 NOTE — Telephone Encounter (Addendum)
Called pt and verified I was speaking to her using two patient identifiers. Discussed the need to perform daily BG checks. Went over clinical definitions of elevated and normal blood sugars. Pt is comfortable using her meter and has no further questions at this time. Reinforced healthy eating, which is important as she is on long-term steroids and this can cause elevations in blood sugar. Pt is amenable.

## 2019-03-14 ENCOUNTER — Telehealth: Payer: Self-pay | Admitting: General Practice

## 2019-03-14 NOTE — Telephone Encounter (Deleted)
We dicussed this at her visit and she is awaiting an appointment with [facility]ematology

## 2019-03-14 NOTE — Telephone Encounter (Signed)
Will route to PCP for review. 

## 2019-03-14 NOTE — Telephone Encounter (Signed)
Patient called to report that the rheumatology office called her to inform they dont see patients for the diagnoses she needs to be seen for and needs to sent to another office. Please follow up.

## 2019-03-14 NOTE — Telephone Encounter (Signed)
We discussed that Rheumatology wouldn't see her however I just saw a note that Hematology will not see her as well. I will perform some research and reach out to them with possible suggestions and specialists.

## 2019-03-15 LAB — FANA STAINING PATTERNS
Homogeneous Pattern: 1:80 {titer}
Speckled Pattern: 1:80 {titer}

## 2019-03-15 LAB — ANA,IFA RA DIAG PNL W/RFLX TIT/PATN
ANA Titer 1: POSITIVE — AB
Cyclic Citrullin Peptide Ab: 5 units (ref 0–19)
Rheumatoid fact SerPl-aCnc: 10.7 IU/mL (ref 0.0–13.9)

## 2019-03-15 LAB — LUPUS ANTICOAGULANT
Dilute Viper Venom Time: 35.8 s (ref 0.0–47.0)
PTT Lupus Anticoagulant: 34.1 s (ref 0.0–51.9)
Thrombin Time: 16.5 s (ref 0.0–23.0)
dPT Confirm Ratio: 1.14 Ratio (ref 0.00–1.40)
dPT: 32.1 s (ref 0.0–55.0)

## 2019-03-15 LAB — COMPLEMENT, TOTAL: Compl, Total (CH50): 56 U/mL (ref >41)

## 2019-03-15 LAB — C3 AND C4
Complement C3, Serum: 145 mg/dL (ref 82–167)
Complement C4, Serum: 21 mg/dL (ref 14–44)

## 2019-03-15 LAB — ANTI-DNA ANTIBODY, DOUBLE-STRANDED: dsDNA Ab: <1 IU/mL (ref 0–9)

## 2019-03-15 NOTE — Telephone Encounter (Signed)
Noted  

## 2019-03-17 ENCOUNTER — Encounter: Payer: Self-pay | Admitting: Family Medicine

## 2019-03-18 ENCOUNTER — Encounter: Payer: Self-pay | Admitting: Family Medicine

## 2019-03-18 NOTE — Telephone Encounter (Signed)
Please follow up on patient's referral.

## 2019-03-18 NOTE — Telephone Encounter (Signed)
Referral has been faxed over to [facility] breast surgery.

## 2019-03-19 ENCOUNTER — Telehealth: Payer: Self-pay | Admitting: General Practice

## 2019-03-19 ENCOUNTER — Other Ambulatory Visit: Payer: Self-pay | Admitting: Family Medicine

## 2019-03-19 DIAGNOSIS — M2550 Pain in unspecified joint: Secondary | ICD-10-CM

## 2019-03-19 NOTE — Telephone Encounter (Signed)
[facility] called stating [facility] has not received the referral and if it could be resent please follow up.

## 2019-03-20 NOTE — Telephone Encounter (Signed)
Noted  

## 2019-03-22 ENCOUNTER — Encounter: Payer: Self-pay | Admitting: Family Medicine

## 2019-03-26 ENCOUNTER — Telehealth: Payer: Self-pay | Admitting: Family Medicine

## 2019-03-26 NOTE — Telephone Encounter (Signed)
Patient called requesting  Prednisone for 2or 3 weeks   She use [facility] in [place]  Thank you.

## 2019-03-27 MED ORDER — PREDNISONE 20 MG PO TABS: 40.0000 mg | ORAL_TABLET | Freq: Every day | ORAL | 1 refills | Status: AC

## 2019-03-27 NOTE — Telephone Encounter (Signed)
Done

## 2019-03-27 NOTE — Telephone Encounter (Signed)
Patient was called and states that she needs the medications for breast inflammation.

## 2019-03-27 NOTE — Telephone Encounter (Signed)
Patient was called and informed of refill.

## 2019-03-29 DIAGNOSIS — M79662 Pain in left lower leg: Secondary | ICD-10-CM | POA: Diagnosis not present

## 2019-03-29 DIAGNOSIS — R609 Edema, unspecified: Secondary | ICD-10-CM | POA: Diagnosis not present

## 2019-03-29 DIAGNOSIS — M25551 Pain in right hip: Secondary | ICD-10-CM | POA: Diagnosis not present

## 2019-03-29 DIAGNOSIS — M25552 Pain in left hip: Secondary | ICD-10-CM | POA: Diagnosis not present

## 2019-03-29 DIAGNOSIS — M2550 Pain in unspecified joint: Secondary | ICD-10-CM | POA: Diagnosis not present

## 2019-03-29 DIAGNOSIS — R21 Rash and other nonspecific skin eruption: Secondary | ICD-10-CM | POA: Diagnosis not present

## 2019-03-29 DIAGNOSIS — M79661 Pain in right lower leg: Secondary | ICD-10-CM | POA: Diagnosis not present

## 2019-03-31 LAB — ANTI-SMOOTH MUSCLE AB BY IFA: Anti-Smooth Muscle Ab by IFA: <1:20 {titer}

## 2019-04-01 DIAGNOSIS — N61 Mastitis without abscess: Secondary | ICD-10-CM | POA: Diagnosis not present

## 2019-04-01 DIAGNOSIS — M79604 Pain in right leg: Secondary | ICD-10-CM | POA: Diagnosis not present

## 2019-04-01 DIAGNOSIS — M7989 Other specified soft tissue disorders: Secondary | ICD-10-CM | POA: Diagnosis not present

## 2019-04-01 DIAGNOSIS — M25551 Pain in right hip: Secondary | ICD-10-CM | POA: Diagnosis not present

## 2019-04-01 DIAGNOSIS — M25552 Pain in left hip: Secondary | ICD-10-CM | POA: Diagnosis not present

## 2019-04-01 DIAGNOSIS — M792 Neuralgia and neuritis, unspecified: Secondary | ICD-10-CM | POA: Diagnosis not present

## 2019-04-01 DIAGNOSIS — M25561 Pain in right knee: Secondary | ICD-10-CM | POA: Diagnosis not present

## 2019-04-01 DIAGNOSIS — M25562 Pain in left knee: Secondary | ICD-10-CM | POA: Diagnosis not present

## 2019-04-01 DIAGNOSIS — M79605 Pain in left leg: Secondary | ICD-10-CM | POA: Diagnosis not present

## 2019-04-03 DIAGNOSIS — N6042 Mammary duct ectasia of left breast: Secondary | ICD-10-CM | POA: Diagnosis not present

## 2019-04-16 ENCOUNTER — Ambulatory Visit: Payer: BC Managed Care – PPO | Admitting: Family Medicine

## 2019-04-17 ENCOUNTER — Emergency Department (HOSPITAL_COMMUNITY): Payer: BC Managed Care – PPO

## 2019-04-17 ENCOUNTER — Encounter (HOSPITAL_COMMUNITY): Payer: Self-pay | Admitting: *Deleted

## 2019-04-17 ENCOUNTER — Inpatient Hospital Stay (HOSPITAL_COMMUNITY)
Admission: EM | Admit: 2019-04-17 | Discharge: 2019-04-20 | DRG: 871 | Disposition: A | Discharge status: Home / Self Care | Payer: BC Managed Care – PPO | Attending: Family Medicine | Admitting: Family Medicine

## 2019-04-17 ENCOUNTER — Telehealth: Payer: Self-pay | Admitting: General Practice

## 2019-04-17 ENCOUNTER — Other Ambulatory Visit: Payer: Self-pay

## 2019-04-17 DIAGNOSIS — N6042 Mammary duct ectasia of left breast: Secondary | ICD-10-CM | POA: Diagnosis not present

## 2019-04-17 DIAGNOSIS — M2550 Pain in unspecified joint: Secondary | ICD-10-CM | POA: Diagnosis not present

## 2019-04-17 DIAGNOSIS — X58XXXA Exposure to other specified factors, initial encounter: Secondary | ICD-10-CM | POA: Diagnosis present

## 2019-04-17 DIAGNOSIS — Z23 Encounter for immunization: Secondary | ICD-10-CM | POA: Diagnosis not present

## 2019-04-17 DIAGNOSIS — R768 Other specified abnormal immunological findings in serum: Secondary | ICD-10-CM | POA: Diagnosis present

## 2019-04-17 DIAGNOSIS — T380X5A Adverse effect of glucocorticoids and synthetic analogues, initial encounter: Secondary | ICD-10-CM | POA: Diagnosis not present

## 2019-04-17 DIAGNOSIS — A419 Sepsis, unspecified organism: Secondary | ICD-10-CM | POA: Diagnosis not present

## 2019-04-17 DIAGNOSIS — E860 Dehydration: Secondary | ICD-10-CM | POA: Diagnosis present

## 2019-04-17 DIAGNOSIS — Z20828 Contact with and (suspected) exposure to other viral communicable diseases: Secondary | ICD-10-CM | POA: Diagnosis present

## 2019-04-17 DIAGNOSIS — J189 Pneumonia, unspecified organism: Secondary | ICD-10-CM | POA: Diagnosis present

## 2019-04-17 DIAGNOSIS — Z7952 Long term (current) use of systemic steroids: Secondary | ICD-10-CM

## 2019-04-17 DIAGNOSIS — Z79899 Other long term (current) drug therapy: Secondary | ICD-10-CM | POA: Diagnosis not present

## 2019-04-17 DIAGNOSIS — R739 Hyperglycemia, unspecified: Secondary | ICD-10-CM | POA: Diagnosis not present

## 2019-04-17 DIAGNOSIS — Z8701 Personal history of pneumonia (recurrent): Secondary | ICD-10-CM

## 2019-04-17 DIAGNOSIS — Y95 Nosocomial condition: Secondary | ICD-10-CM | POA: Diagnosis present

## 2019-04-17 DIAGNOSIS — I959 Hypotension, unspecified: Secondary | ICD-10-CM | POA: Diagnosis not present

## 2019-04-17 DIAGNOSIS — R000 Tachycardia, unspecified: Secondary | ICD-10-CM | POA: Diagnosis present

## 2019-04-17 DIAGNOSIS — R079 Chest pain, unspecified: Secondary | ICD-10-CM | POA: Diagnosis not present

## 2019-04-17 DIAGNOSIS — R0602 Shortness of breath: Secondary | ICD-10-CM | POA: Diagnosis not present

## 2019-04-17 LAB — BASIC METABOLIC PANEL WITH GFR
Anion gap: 13 (ref 5–15)
BUN: 13 mg/dL (ref 6–20)
CO2: 24 mmol/L (ref 22–32)
Calcium: 9.2 mg/dL (ref 8.9–10.3)
Chloride: 100 mmol/L (ref 98–111)
Creatinine, Ser: 0.71 mg/dL (ref 0.44–1.00)
GFR calc Af Amer: >60 mL/min
GFR calc non Af Amer: >60 mL/min
Glucose, Bld: 228 mg/dL — ABNORMAL HIGH (ref 70–99)
Potassium: 4 mmol/L (ref 3.5–5.1)
Sodium: 137 mmol/L (ref 135–145)

## 2019-04-17 LAB — CBC
HCT: 41.6 % (ref 36.0–46.0)
Hemoglobin: 13.6 g/dL (ref 12.0–15.0)
MCH: 26 pg (ref 26.0–34.0)
MCHC: 32.7 g/dL (ref 30.0–36.0)
MCV: 79.5 fL — ABNORMAL LOW (ref 80.0–100.0)
Platelets: 242 K/uL (ref 150–400)
RBC: 5.23 MIL/uL — ABNORMAL HIGH (ref 3.87–5.11)
RDW: 17.6 % — ABNORMAL HIGH (ref 11.5–15.5)
WBC: 22.1 K/uL — ABNORMAL HIGH (ref 4.0–10.5)
nRBC: 0 % (ref 0.0–0.2)

## 2019-04-17 LAB — TROPONIN I (HIGH SENSITIVITY)
Troponin I (High Sensitivity): 8 ng/L
Troponin I (High Sensitivity): 4 ng/L

## 2019-04-17 LAB — LACTIC ACID, PLASMA
Lactic Acid, Venous: 2.2 mmol/L (ref 0.5–1.9)
Lactic Acid, Venous: 1.1 mmol/L (ref 0.5–1.9)

## 2019-04-17 LAB — I-STAT BETA HCG BLOOD, ED (MC, WL, AP ONLY): I-stat hCG, quantitative: <5 m[IU]/mL

## 2019-04-17 LAB — CK: Total CK: 17 U/L — ABNORMAL LOW (ref 38–234)

## 2019-04-17 MED ORDER — SODIUM CHLORIDE 0.9 % IV BOLUS (SEPSIS)
250.0000 mL | Freq: Once | INTRAVENOUS | Status: AC
  Administered 2019-04-18: 250 mL via INTRAVENOUS

## 2019-04-17 MED ORDER — SODIUM CHLORIDE 0.9 % IV BOLUS (SEPSIS)
1000.0000 mL | Freq: Once | INTRAVENOUS | Status: AC
  Administered 2019-04-17: 21:00:00 1000 mL via INTRAVENOUS

## 2019-04-17 MED ORDER — SODIUM CHLORIDE 0.9% FLUSH: 3.0000 mL | Freq: Once | INTRAVENOUS | Status: DC

## 2019-04-17 MED ORDER — SODIUM CHLORIDE 0.9 % IV SOLN
1.0000 g | Freq: Once | INTRAVENOUS | Status: AC
  Administered 2019-04-17: 1 g via INTRAVENOUS
  Filled 2019-04-17: qty 10

## 2019-04-17 MED ORDER — SODIUM CHLORIDE 0.9 % IV BOLUS: 1000.0000 mL | Freq: Once | INTRAVENOUS | Status: DC

## 2019-04-17 MED ORDER — SODIUM CHLORIDE 0.9 % IV BOLUS (SEPSIS)
1000.0000 mL | Freq: Once | INTRAVENOUS | Status: AC
  Administered 2019-04-17: 1000 mL via INTRAVENOUS

## 2019-04-17 MED ORDER — SODIUM CHLORIDE 0.9 % IV SOLN
500.0000 mg | Freq: Once | INTRAVENOUS | Status: AC
  Administered 2019-04-17: 500 mg via INTRAVENOUS
  Filled 2019-04-17: qty 500

## 2019-04-17 NOTE — Telephone Encounter (Signed)
Patient called stating she believes she has pneumonia again and has been having on going symptoms for about 4-5 days. Patient would like to be seen sooner than their appt but was advised that no sooner appt was available and requested to speak to someone. Please follow up.   . Advised to go to the ed or UC if needing to get seen sooner!

## 2019-04-17 NOTE — Telephone Encounter (Signed)
If she has respiratory symptoms I would recommend she go to the ED or urgent care if there is no availability for virtual clinic visit in respiratory symptoms are not in person visit in the clinic at this time.

## 2019-04-17 NOTE — Telephone Encounter (Signed)
Route to PCP

## 2019-04-17 NOTE — ED Provider Notes (Signed)
[place] EMERGENCY DEPARTMENT Provider Note   CSN: [id number] Arrival date & time: 04/17/19  1632     History   Chief Complaint Chief Complaint  Patient presents with  . Generalized Body Aches    HPI Erica Williamson is a 28 y.o. female with PMHx plasma cell mastitis of left breast who presents to the ED today complaining of new onset, constant, shortness of breath, productive cough, chest pain x5 days.  Patient also complains of generalized body aches.  She reports history of pneumonia 2 months ago and states this feels fairly similar.  She denies any fevers or chills.  She has been taking over-the-counter medication without relief.  States that the shortness of breath became worse today prompting her to come to the ED.  No other symptoms at this time.       History reviewed. No pertinent past medical history.  Patient Active Problem List   Diagnosis Date Noted  . HCAP (healthcare-associated pneumonia) 04/17/2019  . Sepsis ([place]) 04/17/2019  . Polyarthralgia 03/06/2019  . Hyperglycemia 02/18/2019  . Plasma cell mastitis of left breast 02/15/2019  . Tachycardia 02/15/2019  . Nausea 02/15/2019  . Transient hypotension 01/26/2019  . Normocytic anemia 01/26/2019    Past Surgical History:  Procedure Laterality Date  . CESAREAN SECTION     X2     OB History    Gravida  1   Para      Term      Preterm      AB      Living        SAB      TAB      Ectopic      Multiple      Live Births               Home Medications    Prior to Admission medications   Medication Sig Start Date End Date Taking? Authorizing Provider  pantoprazole (PROTONIX) 40 MG tablet Take 1 tablet (40 mg total) by mouth daily. Patient taking differently: Take 40 mg by mouth daily as needed (for reflux symptoms).  02/20/19 04/17/19 Yes Black, Lezlie Octave, NP  predniSONE (DELTASONE) 20 MG tablet Take 2 tablets (40 mg total) by mouth daily with breakfast. 03/27/19  Yes  Newlin, Enobong, MD  Blood Glucose Monitoring Suppl (CONTOUR NEXT MONITOR) w/Device KIT 1 each by Does not apply route daily before breakfast. 03/13/19   Charlott Rakes, MD  glucose blood (CONTOUR NEXT TEST) test strip Use as instructed daily 03/13/19   Charlott Rakes, MD  ondansetron (ZOFRAN ODT) 4 MG disintegrating tablet Take 1 tablet (4 mg total) by mouth every 8 (eight) hours as needed for nausea or vomiting. Patient not taking: Reported on 04/17/2019 02/14/19   [place] Callas, NP    Family History Family History  Problem Relation Age of Onset  . Diabetes Neg Hx     Social History Social History   Tobacco Use  . Smoking status: Never Smoker  . Smokeless tobacco: Never Used  Substance Use Topics  . Alcohol use: Never    Frequency: Never  . Drug use: Never     Allergies   Patient has no known allergies.   Review of Systems Review of Systems  Constitutional: Negative for chills and fever.  HENT: Negative for congestion.   Eyes: Negative for visual disturbance.  Respiratory: Positive for cough and shortness of breath.   Cardiovascular: Positive for chest pain.  Gastrointestinal: Negative for  abdominal pain, diarrhea, nausea and vomiting.  Genitourinary: Negative for dysuria and frequency.  Musculoskeletal: Positive for myalgias.  Skin: Negative for rash.  Neurological: Negative for headaches.     Physical Exam Updated Vital Signs BP 133/84 (BP Location: Right Arm)   Pulse (!) 115   Temp 98.7 F (37.1 C) (Oral)   Resp 19   Ht _0  (1.626 m)   Wt 66.4 kg   LMP 04/10/2019   SpO2 99%   BMI 25.13 kg/m   Physical Exam Vitals signs and nursing note reviewed.  Constitutional:      Appearance: She is not ill-appearing.  HENT:     Head: Normocephalic and atraumatic.     Right Ear: Tympanic membrane normal.     Left Ear: Tympanic membrane normal.  Eyes:     Conjunctiva/sclera: Conjunctivae normal.  Neck:     Musculoskeletal: Neck supple.   Cardiovascular:     Rate and Rhythm: Regular rhythm. Tachycardia present.  Pulmonary:     Effort: Pulmonary effort is normal.     Breath sounds: Rales present. No wheezing or rhonchi.  Abdominal:     Palpations: Abdomen is soft.     Tenderness: There is no abdominal tenderness. There is no guarding or rebound.  Musculoskeletal:     Right lower leg: No edema.     Left lower leg: No edema.  Skin:    General: Skin is warm and dry.  Neurological:     Mental Status: She is alert.      ED Treatments / Results  Labs (all labs ordered are listed, but only abnormal results are displayed) Labs Reviewed  BASIC METABOLIC PANEL - Abnormal; Notable for the following components:      Result Value   Glucose, Bld 228 (*)    All other components within normal limits  CBC - Abnormal; Notable for the following components:   WBC 22.1 (*)    RBC 5.23 (*)    MCV 79.5 (*)    RDW 17.6 (*)    All other components within normal limits  LACTIC ACID, PLASMA - Abnormal; Notable for the following components:   Lactic Acid, Venous 2.2 (*)    All other components within normal limits  CULTURE, BLOOD (ROUTINE X 2)  CULTURE, BLOOD (ROUTINE X 2)  SARS CORONAVIRUS 2 (HOSPITAL ORDER, [facility])  RESPIRATORY PANEL BY PCR  MRSA PCR SCREENING  LACTIC ACID, PLASMA  URINALYSIS, ROUTINE W REFLEX MICROSCOPIC  CK  LACTATE DEHYDROGENASE  ALDOLASE  CBC WITH DIFFERENTIAL/PLATELET  HEPATIC FUNCTION PANEL  I-STAT BETA HCG BLOOD, ED ([facility], [facility], [facility] ONLY)  TROPONIN I (HIGH SENSITIVITY)  TROPONIN I (HIGH SENSITIVITY)    EKG None  Radiology Dg Chest 2 View  Result Date: 04/17/2019 CLINICAL DATA:  Chest pain EXAM: CHEST - 2 VIEW COMPARISON:  02/14/2019 chest radiograph. FINDINGS: Stable cardiomediastinal silhouette with normal heart size. No pneumothorax. No pleural effusion. Hazy patchy opacities at both lung bases. No pulmonary edema. IMPRESSION: Hazy patchy opacities at both lung  bases, suggestive of pneumonia. Electronically Signed   By: Ilona Sorrel M.D.   On: 04/17/2019 17:10    Procedures Procedures (including critical care time)  Medications Ordered in ED Medications  sodium chloride flush (NS) 0.9 % injection 3 mL (3 mLs Intravenous Not Given 04/17/19 2050)  sodium chloride 0.9 % bolus 1,000 mL (1,000 mLs Intravenous New Bag/Given 04/17/19 2056)    And  sodium chloride 0.9 % bolus 1,000 mL (0  mLs Intravenous Stopped 04/17/19 2208)    And  sodium chloride 0.9 % bolus 250 mL (has no administration in time range)  cefTRIAXone (ROCEPHIN) 1 g in sodium chloride 0.9 % 100 mL IVPB ( Intravenous Stopped 04/17/19 2130)  azithromycin (ZITHROMAX) 500 mg in sodium chloride 0.9 % 250 mL IVPB ( Intravenous Stopped 04/17/19 2205)     Initial Impression / Assessment and Plan / ED Course  I have reviewed the triage vital signs and the nursing notes.  Pertinent labs & imaging results that were available during my care of the patient were reviewed by me and considered in my medical decision making (see chart for details).    29 year old female who presents to the ED complaining of myalgias, chest pain, shortness of breath x5 days.  History of pneumonia 2 months ago for which she was admitted.  Work was obtained while patient was in the waiting room - leukocytosis present at 22,000 chest x-ray with bilateral opacities.  Arrival patient was tachycardic at 128.  No lactic acid was obtained while patient was in the waiting room.  On arrival to the room we have collected a lactic acid.  Cultures collected.  Started patient on ceftriaxone and Zithromax and given IV fluids.  Lactic acid positive at 2.2.  Remainder of IV fluids ordered.  Patient will need to be admitted at this time with concern for sepsis secondary to pneumonia.  Consult hospitalist for admission.  Will obtain rapid covid test given bilateral opacities.   Discussed case with hospitalist Dr. Roel Cluck who agrees to accept  patient for admission.   This note was prepared using Dragon voice recognition software and may include unintentional dictation errors due to the inherent limitations of voice recognition software.      Final Clinical Impressions(s) / ED Diagnoses   Final diagnoses:  Community acquired pneumonia, unspecified laterality  Sepsis without acute organ dysfunction, due to unspecified organism [facility])    ED Discharge Orders    None       Eustaquio Maize, PA-C 04/17/19 2328    Gareth Morgan, MD 04/18/19 1228

## 2019-04-17 NOTE — ED Triage Notes (Signed)
The pt is c/o chest pain and generalized body aches for 5 days no temp  Some sob  None now  Hx of pneumonia 2 months ago  No temp

## 2019-04-17 NOTE — Telephone Encounter (Signed)
Patient was called and informed of PCP response to go to ED.

## 2019-04-17 NOTE — H&P (Signed)
Erica Williamson SKS:[id number] DOB: 03-Aug-1990 DOA: 04/17/2019     PCP: Patient, No Pcp Per   Outpatient Specialists:  Ruematology at [facility]   Patient arrived to ER on 04/17/19 at 1632  Patient coming from: home Lives   With family    Chief Complaint:   Chief Complaint  Patient presents with  . Generalized Body Aches    HPI: Erica Williamson is a 28 y.o. female with medical history significant of plasma cell mastitis     Presented with   5 days of respiratory symptoms and joint pain in her arms and legs  She has been still on prednisone for her plasma cell mastitis No fever, some shortness of breath states it feels like PNA she had before Reports no sick contacts She called her PCP who asked her to go to ER  She has a diagnosis of plasma cell mastitis (from pathology reports at [facility]).  This was initially treated as cellulitis with different rounds of antibiotic however during her most recent hospitalization at [facility] she was commenced on steroids per surgery recommendation and if uncontrolled consideration for mastectomy. She also developed arthralgias which improved with steroids.  Her last hospitalization was from 7/30 to 02/19/2019 at [facility] for nausea and vomiting. referred to [facility] rheumatology who declined referral and recommended hematology referral.  If she does not take the steroids she has generalized body aches If she takes only 20 mg she has severe pain Hematogy/oncology at [facility] trying to establish care. ANA titer was positive with homogeneous and speckled pattern 1:80 But Anti-smooth muscle antibody and lupus anticoagulant has been negative Complement C3 and C4 was both within normal limits  Tested negative for COVID in July Respiratory panel was negative as well HIV nonreactive  During prior admission sed rate elevated 118 CRP 12.6 proCalcitonin was unremarkable  Infectious risk factors:   Reports shortness of breath, dry cough, chest pain,     In  ER RAPID COVID TEST  in house testing  Pending  Lab Results  Component Value Date   [place] NEGATIVE 02/15/2019   [place] NOT DETECTED 01/26/2019   [place] NEGATIVE 01/26/2019     Regarding pertinent Chronic problems:   Plasma cell Mastitis on prednisone 40 mg Qday    While in ER: CXR worrisome for PNA  The following Work up has been ordered so far:  Orders Placed This Encounter  Procedures  . Blood Culture (routine x 2)  . SARS Coronavirus 2 [facility] order, Performed in [facility] hospital lab) Nasopharyngeal Nasopharyngeal Swab  . DG Chest 2 View  . Basic metabolic panel  . CBC  . Lactic acid, plasma  . Diet NPO time specified  . Cardiac monitoring  . Saline Lock IV, Maintain IV access  . Cardiac monitoring  . Refer to Sidebar Report: Sepsis Sidebar ED/IP  . Document vital signs within 1-hour of fluid bolus completion and notify provider of bolus completion  . Document Actual / Estimated Weight  . Insert peripheral IV x 2  . Consult to hospitalist  ALL PATIENTS BEING ADMITTED/HAVING PROCEDURES NEED COVID-19 SCREENING  . Airborne and Contact precautions  . Pulse oximetry, continuous  . Pulse oximetry, continuous  . I-Stat beta hCG blood, ED  . ED EKG     Following Medications were ordered in ER: Medications  sodium chloride flush (NS) 0.9 % injection 3 mL (3 mLs Intravenous Not Given 04/17/19 2050)  azithromycin (ZITHROMAX) 500  mg in sodium chloride 0.9 % 250 mL IVPB (500 mg Intravenous New Bag/Given 04/17/19 2104)  sodium chloride 0.9 % bolus 1,000 mL (1,000 mLs Intravenous New Bag/Given 04/17/19 2056)    And  sodium chloride 0.9 % bolus 1,000 mL (1,000 mLs Intravenous New Bag/Given 04/17/19 2100)    And  sodium chloride 0.9 % bolus 250 mL (has no administration in time range)  cefTRIAXone (ROCEPHIN) 1 g in sodium chloride 0.9 % 100 mL IVPB (1 g Intravenous New Bag/Given 04/17/19 2059)         Consult Orders  (From admission, onward)         Start     Ordered   04/17/19 2124  Consult to hospitalist  ALL PATIENTS BEING ADMITTED/HAVING PROCEDURES NEED COVID-19 SCREENING PAGED [facility]  Once    Comments: ALL PATIENTS BEING ADMITTED/HAVING PROCEDURES NEED COVID-19 SCREENING  Provider:  (Not yet assigned)  Question Answer Comment  Place call to: [facility]   Reason for Consult Admit      04/17/19 2123            Significant initial  Findings: Abnormal Labs Reviewed  BASIC METABOLIC PANEL - Abnormal; Notable for the following components:      Result Value   Glucose, Bld 228 (*)    All other components within normal limits  CBC - Abnormal; Notable for the following components:   WBC 22.1 (*)    RBC 5.23 (*)    MCV 79.5 (*)    RDW 17.6 (*)    All other components within normal limits  LACTIC ACID, PLASMA - Abnormal; Notable for the following components:   Lactic Acid, Venous 2.2 (*)    All other components within normal limits      Otherwise labs showing:    Recent Labs  Lab 04/17/19 1652  NA 137  K 4.0  CO2 24  GLUCOSE 228*  BUN 13  CREATININE 0.71  CALCIUM 9.2    Cr    stable,   Lab Results  Component Value Date   CREATININE 0.71 04/17/2019   CREATININE 0.56 02/19/2019   CREATININE 0.39 (L) 02/18/2019    No results for input(s): AST, ALT, ALKPHOS, BILITOT, PROT, ALBUMIN in the last 168 hours. Lab Results  Component Value Date   CALCIUM 9.2 04/17/2019   PHOS 2.5 02/15/2019     WBC      Component Value Date/Time   WBC 22.1 (H) 04/17/2019 1652   ANC    Component Value Date/Time   NEUTROABS 13.6 (H) 02/14/2019 1336   ALC No components found for: LYMPHAB    Plt: Lab Results  Component Value Date   PLT 242 04/17/2019    Lactic Acid, Venous    Component Value Date/Time   LATICACIDVEN 2.2 ([place]) 04/17/2019 2002    Procalcitonin  Ordered   COVID-19 Labs  No results for input(s): DDIMER, FERRITIN, LDH, CRP in the  last 72 hours.  Lab Results  Component Value Date   SARSCOV2NAA NEGATIVE 02/15/2019   SARSCOV2NAA NOT DETECTED 01/26/2019   [place] NEGATIVE 01/26/2019      HG/HCT   stable,      Component Value Date/Time   HGB 13.6 04/17/2019 1652   HCT 41.6 04/17/2019 1652   HCT 26.4 (L) 01/29/2019 1201      Troponin  ordered Cardiac Panel (last 3 results) No results for input(s): CKTOTAL, CKMB, TROPONINI, RELINDX in the last 72 hours.     ECG: Ordered Personally reviewed by me showing:  HR : 131 Rhythm:  Sinus tachycardia  Ischemic changes , no evidence of ischemic changes QTC 434    DM  labs:  HbA1C: Recent Labs    03/13/19 1140  HGBA1C 5.3       UA   Ordered      CXR - Hazy patchy opacities at both lung bases, suggestive of pneumonia     ED Triage Vitals  Enc Vitals Group     BP 04/17/19 1640 120/79     Pulse Rate 04/17/19 1640 (!) 128     Resp 04/17/19 1640 (!) 160     Temp 04/17/19 1640 97.6 F (36.4 C)     Temp Source 04/17/19 1903 Oral     SpO2 04/17/19 1640 100 %     Weight 04/17/19 1643 146 lb 6.2 oz (66.4 kg)     Height 04/17/19 1643 '5\' 4"'  (1.626 m)     Head Circumference --      Peak Flow --      Pain Score 04/17/19 1643 6     Pain Loc --      Pain Edu? --      Excl. in [place]? --   TMAX(24)@       Latest  Blood pressure 103/77, pulse (!) 102, temperature 98.7 F (37.1 C), temperature source Oral, resp. rate (!) 28, height '5\' 4"'  (1.626 m), weight 66.4 kg, last menstrual period 04/10/2019, SpO2 98 %, unknown if currently breastfeeding.     Hospitalist was called for admission for Sepsis due to PNA   Review of Systems:    Pertinent positives include: chest pain,  shortness of breath at rest.  dyspnea on exertion  Constitutional:  No weight loss, night sweats, Fevers, chills, fatigue, weight loss  HEENT:  No headaches, Difficulty swallowing,Tooth/dental problems,Sore throat,  No sneezing, itching, ear ache, nasal congestion, post nasal drip,   Cardio-vascular:  No  Orthopnea, PND, anasarca, dizziness, palpitations.no Bilateral lower extremity swelling  GI:  No heartburn, indigestion, abdominal pain, nausea, vomiting, diarrhea, change in bowel habits, loss of appetite, melena, blood in stool, hematemesis Resp:  no, No excess mucus, no productive cough, No non-productive cough, No coughing up of blood.No change in color of mucus.No wheezing. Skin:  no rash or lesions. No jaundice GU:  no dysuria, change in color of urine, no urgency or frequency. No straining to urinate.  No flank pain.  Musculoskeletal:  No joint pain or no joint swelling. No decreased range of motion. No back pain.  Psych:  No change in mood or affect. No depression or anxiety. No memory loss.  Neuro: no localizing neurological complaints, no tingling, no weakness, no double vision, no gait abnormality, no slurred speech, no confusion  All systems reviewed and apart from [place] all are negative  Past Medical History:  History reviewed. No pertinent past medical history.    Past Surgical History:  Procedure Laterality Date  . CESAREAN SECTION     X2    Social History:  Ambulatory   Independently      reports that she has never smoked. She has never used smokeless tobacco. She reports that she does not drink alcohol or use drugs.   Family History:   Family History  Problem Relation Age of Onset  . Diabetes Neg Hx     Allergies: No Known Allergies   Prior to Admission medications   Medication Sig Start Date End Date Taking? Authorizing Provider  pantoprazole (PROTONIX) 40 MG tablet Take 1 tablet (40  mg total) by mouth daily. Patient taking differently: Take 40 mg by mouth daily as needed (for reflux symptoms).  02/20/19 04/17/19 Yes Black, Lezlie Octave, NP  predniSONE (DELTASONE) 20 MG tablet Take 2 tablets (40 mg total) by mouth daily with breakfast. 03/27/19  Yes Newlin, Enobong, MD  Blood Glucose Monitoring Suppl (CONTOUR NEXT MONITOR) w/Device KIT  1 each by Does not apply route daily before breakfast. 03/13/19   Charlott Rakes, MD  glucose blood (CONTOUR NEXT TEST) test strip Use as instructed daily 03/13/19   Charlott Rakes, MD  ondansetron (ZOFRAN ODT) 4 MG disintegrating tablet Take 1 tablet (4 mg total) by mouth every 8 (eight) hours as needed for nausea or vomiting. Patient not taking: Reported on 04/17/2019 02/14/19   Piffard Callas, NP   Physical Exam: Blood pressure 103/77, pulse (!) 102, temperature 98.7 F (37.1 C), temperature source Oral, resp. rate (!) 28, height '5\' 4"'  (1.626 m), weight 66.4 kg, last menstrual period 04/10/2019, SpO2 98 %, unknown if currently breastfeeding. 1. General:  in No  Acute distress   Chronically ill -appearing 2. Psychological: Alert and  Oriented 3. Head/ENT:   Dry Mucous Membranes                          Head Non traumatic, neck supple                           Normal Dentition  numerous xanthomas  4. SKIN: normal  Regular rate and rhythm no  Murmur, no Rub or gallop 6. Lungs:  no wheezes or crackles   7. Abdomen: Soft,  non-tender  8. Lower extremities: no clubbing, cyanosis, no  edema 9. Neurologically Grossly intact, moving all 4 extremities equally   10. MSK: Normal range of motion   All other LABS:     Recent Labs  Lab 04/17/19 1652  WBC 22.1*  HGB 13.6  HCT 41.6  MCV 79.5*  PLT 242     Recent Labs  Lab 04/17/19 1652  NA 137  K 4.0  CL 100  CO2 24  GLUCOSE 228*  BUN 13  CREATININE 0.71  CALCIUM 9.2     No results for input(s): AST, ALT, ALKPHOS, BILITOT, PROT, ALBUMIN in the last 168 hours.     Cultures:    Component Value Date/Time   SDES BLOOD RIGHT HAND 02/15/2019 0058   SPECREQUEST  02/15/2019 0058    BOTTLES DRAWN AEROBIC AND ANAEROBIC Blood Culture results may not be optimal due to an excessive volume of blood received in culture bottles   CULT  02/15/2019 0058    NO GROWTH 5 DAYS Performed at [facility], [place] [address],  [place],  [id number]    REPTSTATUS 02/20/2019 FINAL 02/15/2019 0058     Radiological Exams on Admission: Dg Chest 2 View  Result Date: 04/17/2019 CLINICAL DATA:  Chest pain EXAM: CHEST - 2 VIEW COMPARISON:  02/14/2019 chest radiograph. FINDINGS: Stable cardiomediastinal silhouette with normal heart size. No pneumothorax. No pleural effusion. Hazy patchy opacities at both lung bases. No pulmonary edema. IMPRESSION: Hazy patchy opacities at both lung bases, suggestive of pneumonia. Electronically Signed   By: Ilona Sorrel M.D.   On: 04/17/2019 17:10    Chart has been reviewed    Assessment/Plan  28 y.o. female with medical history significant of plasma cell mastitis   Admitted for HCAP and Sepsis Present on Admission: .  HCAP (healthcare-associated pneumonia) -for now continue with Rocephin ceftriaxone obtain sputum cultures, check MRSA serology, Obtain influenza and respiratory panel testing COVID testing currently pending if negative can dc precautions as pt symptoms likely explained by underlining process being currently worked up.   . Transient hypotension -improved with rehydration continue to monitor . Plasma cell mastitis of left breast -followed as an outpatient . Tachycardia -in the setting of sepsis and dehydration will rehydrate and monitor currently improving . Hyperglycemia -likely secondary to steroids.  Will monitor this blood sugar and order sliding scale . Polyarthralgia - continue prednisone for now as patient states it is out prednisone her pain has gone up.  She is arranging follow-up at [facility] with hematology. Will check CK and aldolase levels. Prior rheumatological work-up did not show evidence of SLE   . Sepsis ([place]) -   -Patient meets sepsis criteria with   leukocytosis  Tachycardia   Initial lactic acid Lactic Acid, Venous    Component Value Date/Time   LATICACIDVEN 2.2 ([place]) 04/17/2019 2002   Source most likely: pneumonia,   -We will rehydrate, treat with  IV antibiotics, follow lactic acid - Await results of blood and urine culture and adjust antibiotics as needed - Obtain MRSA serologies  - Obtain respiratory panel    [place] -will check lipid panel  Other plan as per orders.  DVT prophylaxis:   Lovenox     Code Status:  FULL CODE   as per patient  I had personally discussed CODE STATUS with patient   Family Communication:   Family not at  Bedside  plan of care was discussed on the phone with  Husband,    Disposition Plan:   To home once workup is complete and patient is stable                       Consults called: none  Admission status:  ED Disposition    None      Obs      Level of care  tele        Precautions:   Airborne and Contact precautions  PPE: Used by the provider:   P100  eye Goggles,  Gloves  gown     Hayla Hinger 04/17/2019, 11:32 PM    [facility]     after 2 AM please page floor coverage PA If 7AM-7PM, please contact the day team taking care of the patient using [email or web address]

## 2019-04-18 DIAGNOSIS — R739 Hyperglycemia, unspecified: Secondary | ICD-10-CM | POA: Diagnosis present

## 2019-04-18 DIAGNOSIS — J189 Pneumonia, unspecified organism: Secondary | ICD-10-CM | POA: Insufficient documentation

## 2019-04-18 DIAGNOSIS — N6042 Mammary duct ectasia of left breast: Secondary | ICD-10-CM | POA: Diagnosis present

## 2019-04-18 DIAGNOSIS — Z7952 Long term (current) use of systemic steroids: Secondary | ICD-10-CM | POA: Diagnosis not present

## 2019-04-18 DIAGNOSIS — X58XXXA Exposure to other specified factors, initial encounter: Secondary | ICD-10-CM | POA: Diagnosis present

## 2019-04-18 DIAGNOSIS — T380X5A Adverse effect of glucocorticoids and synthetic analogues, initial encounter: Secondary | ICD-10-CM | POA: Diagnosis present

## 2019-04-18 DIAGNOSIS — Z23 Encounter for immunization: Secondary | ICD-10-CM | POA: Diagnosis not present

## 2019-04-18 DIAGNOSIS — A419 Sepsis, unspecified organism: Secondary | ICD-10-CM | POA: Diagnosis present

## 2019-04-18 DIAGNOSIS — Y95 Nosocomial condition: Secondary | ICD-10-CM | POA: Diagnosis present

## 2019-04-18 DIAGNOSIS — Z20828 Contact with and (suspected) exposure to other viral communicable diseases: Secondary | ICD-10-CM | POA: Diagnosis present

## 2019-04-18 DIAGNOSIS — R768 Other specified abnormal immunological findings in serum: Secondary | ICD-10-CM | POA: Diagnosis present

## 2019-04-18 DIAGNOSIS — Z79899 Other long term (current) drug therapy: Secondary | ICD-10-CM | POA: Diagnosis not present

## 2019-04-18 DIAGNOSIS — Z8701 Personal history of pneumonia (recurrent): Secondary | ICD-10-CM | POA: Diagnosis not present

## 2019-04-18 DIAGNOSIS — M2550 Pain in unspecified joint: Secondary | ICD-10-CM | POA: Diagnosis present

## 2019-04-18 DIAGNOSIS — E860 Dehydration: Secondary | ICD-10-CM | POA: Diagnosis present

## 2019-04-18 LAB — HEPATIC FUNCTION PANEL
ALT: 25 U/L (ref 0–44)
AST: 16 U/L (ref 15–41)
Albumin: 2.4 g/dL — ABNORMAL LOW (ref 3.5–5.0)
Alkaline Phosphatase: 62 U/L (ref 38–126)
Bilirubin, Direct: <0.1 mg/dL (ref 0.0–0.2)
Total Bilirubin: 0.5 mg/dL (ref 0.3–1.2)
Total Protein: 5.3 g/dL — ABNORMAL LOW (ref 6.5–8.1)

## 2019-04-18 LAB — RESPIRATORY PANEL BY PCR

## 2019-04-18 LAB — PROCALCITONIN: Procalcitonin: <0.1 ng/mL

## 2019-04-18 LAB — SARS CORONAVIRUS 2 BY RT PCR (HOSPITAL ORDER, PERFORMED IN ~~LOC~~ HOSPITAL LAB): SARS Coronavirus 2: NEGATIVE

## 2019-04-18 LAB — CBC WITH DIFFERENTIAL/PLATELET
Abs Immature Granulocytes: 0.1 10*3/uL — ABNORMAL HIGH (ref 0.00–0.07)
Basophils Absolute: 0.1 10*3/uL (ref 0.0–0.1)
Basophils Relative: 0 %
Eosinophils Absolute: 0.1 10*3/uL (ref 0.0–0.5)
Eosinophils Relative: 1 %
HCT: 33.7 % — ABNORMAL LOW (ref 36.0–46.0)
Hemoglobin: 10.9 g/dL — ABNORMAL LOW (ref 12.0–15.0)
Immature Granulocytes: 1 %
Lymphocytes Relative: 32 %
Lymphs Abs: 4.5 10*3/uL — ABNORMAL HIGH (ref 0.7–4.0)
MCH: 25.5 pg — ABNORMAL LOW (ref 26.0–34.0)
MCHC: 32.3 g/dL (ref 30.0–36.0)
MCV: 78.7 fL — ABNORMAL LOW (ref 80.0–100.0)
Monocytes Absolute: 0.9 10*3/uL (ref 0.1–1.0)
Monocytes Relative: 7 %
Neutro Abs: 8.4 10*3/uL — ABNORMAL HIGH (ref 1.7–7.7)
Neutrophils Relative %: 59 %
Platelets: 210 10*3/uL (ref 150–400)
RBC: 4.28 MIL/uL (ref 3.87–5.11)
RDW: 17.2 % — ABNORMAL HIGH (ref 11.5–15.5)
WBC: 14.1 10*3/uL — ABNORMAL HIGH (ref 4.0–10.5)
nRBC: 0 % (ref 0.0–0.2)

## 2019-04-18 LAB — COMPREHENSIVE METABOLIC PANEL
ALT: 25 U/L (ref 0–44)
AST: 15 U/L (ref 15–41)
Albumin: 2.4 g/dL — ABNORMAL LOW (ref 3.5–5.0)
Alkaline Phosphatase: 60 U/L (ref 38–126)
Anion gap: 4 — ABNORMAL LOW (ref 5–15)
BUN: 11 mg/dL (ref 6–20)
CO2: 24 mmol/L (ref 22–32)
Calcium: 7.9 mg/dL — ABNORMAL LOW (ref 8.9–10.3)
Chloride: 111 mmol/L (ref 98–111)
Creatinine, Ser: 0.47 mg/dL (ref 0.44–1.00)
GFR calc Af Amer: >60 mL/min (ref >60)
GFR calc non Af Amer: >60 mL/min (ref >60)
Glucose, Bld: 105 mg/dL — ABNORMAL HIGH (ref 70–99)
Potassium: 3.3 mmol/L — ABNORMAL LOW (ref 3.5–5.1)
Sodium: 139 mmol/L (ref 135–145)
Total Bilirubin: 0.4 mg/dL (ref 0.3–1.2)
Total Protein: 5.3 g/dL — ABNORMAL LOW (ref 6.5–8.1)

## 2019-04-18 LAB — HEMOGLOBIN A1C
Hgb A1c MFr Bld: 5.5 % (ref 4.8–5.6)
Mean Plasma Glucose: 111.15 mg/dL

## 2019-04-18 LAB — LIPID PANEL
Cholesterol: 366 mg/dL — ABNORMAL HIGH (ref 0–200)
HDL: 58 mg/dL (ref >40)
LDL Cholesterol: 294 mg/dL — ABNORMAL HIGH (ref 0–99)
Total CHOL/HDL Ratio: 6.3 RATIO
Triglycerides: 71 mg/dL (ref <150)
VLDL: 14 mg/dL (ref 0–40)

## 2019-04-18 LAB — SEDIMENTATION RATE: Sed Rate: 21 mm/hr (ref 0–22)

## 2019-04-18 LAB — MRSA PCR SCREENING: MRSA by PCR: NEGATIVE

## 2019-04-18 LAB — URINALYSIS, ROUTINE W REFLEX MICROSCOPIC
Bilirubin Urine: NEGATIVE
Glucose, UA: NEGATIVE mg/dL
Hgb urine dipstick: NEGATIVE
Ketones, ur: NEGATIVE mg/dL
Leukocytes,Ua: NEGATIVE
Nitrite: NEGATIVE
Protein, ur: NEGATIVE mg/dL
Specific Gravity, Urine: 1.019 (ref 1.005–1.030)
pH: 6 (ref 5.0–8.0)

## 2019-04-18 LAB — C-REACTIVE PROTEIN: CRP: 6 mg/dL — ABNORMAL HIGH (ref <1.0)

## 2019-04-18 LAB — STREP PNEUMONIAE URINARY ANTIGEN: Strep Pneumo Urinary Antigen: NEGATIVE

## 2019-04-18 LAB — MAGNESIUM: Magnesium: 1.9 mg/dL (ref 1.7–2.4)

## 2019-04-18 LAB — GLUCOSE, CAPILLARY
Glucose-Capillary: 125 mg/dL — ABNORMAL HIGH (ref 70–99)
Glucose-Capillary: 71 mg/dL (ref 70–99)
Glucose-Capillary: 82 mg/dL (ref 70–99)
Glucose-Capillary: 182 mg/dL — ABNORMAL HIGH (ref 70–99)
Glucose-Capillary: 86 mg/dL (ref 70–99)

## 2019-04-18 LAB — INFLUENZA PANEL BY PCR (TYPE A & B)
Influenza A By PCR: NEGATIVE
Influenza B By PCR: NEGATIVE

## 2019-04-18 LAB — LACTATE DEHYDROGENASE: LDH: 152 U/L (ref 98–192)

## 2019-04-18 LAB — PHOSPHORUS: Phosphorus: 2.8 mg/dL (ref 2.5–4.6)

## 2019-04-18 LAB — PROTIME-INR
INR: 1 (ref 0.8–1.2)
Prothrombin Time: 13.2 seconds (ref 11.4–15.2)

## 2019-04-18 LAB — APTT: aPTT: 31 seconds (ref 24–36)

## 2019-04-18 LAB — TSH: TSH: 1.09 u[IU]/mL (ref 0.350–4.500)

## 2019-04-18 MED ORDER — ACETAMINOPHEN 325 MG PO TABS
650.0000 mg | ORAL_TABLET | Freq: Four times a day (QID) | ORAL | Status: DC | PRN
  Administered 2019-04-19: 650 mg via ORAL
  Filled 2019-04-18: qty 2

## 2019-04-18 MED ORDER — ONDANSETRON HCL 4 MG/2ML IJ SOLN: 4.0000 mg | Freq: Four times a day (QID) | INTRAMUSCULAR | Status: DC | PRN

## 2019-04-18 MED ORDER — PANTOPRAZOLE SODIUM 40 MG PO TBEC: 40.0000 mg | DELAYED_RELEASE_TABLET | Freq: Every day | ORAL | Status: DC | PRN

## 2019-04-18 MED ORDER — INSULIN ASPART 100 UNIT/ML ~~LOC~~ SOLN
0.0000 [IU] | Freq: Three times a day (TID) | SUBCUTANEOUS | Status: DC
  Administered 2019-04-18 – 2019-04-19 (×2): 3 [IU] via SUBCUTANEOUS
  Administered 2019-04-19: 2 [IU] via SUBCUTANEOUS

## 2019-04-18 MED ORDER — ONDANSETRON HCL 4 MG PO TABS: 4.0000 mg | ORAL_TABLET | Freq: Four times a day (QID) | ORAL | Status: DC | PRN

## 2019-04-18 MED ORDER — INSULIN ASPART 100 UNIT/ML ~~LOC~~ SOLN: 0.0000 [IU] | Freq: Every day | SUBCUTANEOUS | Status: DC

## 2019-04-18 MED ORDER — HYDROCODONE-ACETAMINOPHEN 5-325 MG PO TABS: 1.0000 | ORAL_TABLET | ORAL | Status: DC | PRN

## 2019-04-18 MED ORDER — ACETAMINOPHEN 650 MG RE SUPP: 650.0000 mg | Freq: Four times a day (QID) | RECTAL | Status: DC | PRN

## 2019-04-18 MED ORDER — INFLUENZA VAC SPLIT QUAD 0.5 ML IM SUSY
0.5000 mL | PREFILLED_SYRINGE | INTRAMUSCULAR | Status: AC
  Administered 2019-04-20: 0.5 mL via INTRAMUSCULAR
  Filled 2019-04-18: qty 0.5

## 2019-04-18 MED ORDER — GUAIFENESIN ER 600 MG PO TB12
600.0000 mg | ORAL_TABLET | Freq: Two times a day (BID) | ORAL | Status: DC
  Administered 2019-04-18 – 2019-04-20 (×5): 600 mg via ORAL
  Filled 2019-04-18 (×5): qty 1

## 2019-04-18 MED ORDER — SODIUM CHLORIDE 0.9 % IV SOLN: 100.0000 mg/kg | Freq: Three times a day (TID) | INTRAVENOUS | Status: DC

## 2019-04-18 MED ORDER — PIPERACILLIN-TAZOBACTAM 3.375 G IVPB
3.3750 g | Freq: Three times a day (TID) | INTRAVENOUS | Status: DC
  Administered 2019-04-18 – 2019-04-20 (×5): 3.375 g via INTRAVENOUS
  Filled 2019-04-18 (×10): qty 50

## 2019-04-18 MED ORDER — PREDNISONE 20 MG PO TABS
40.0000 mg | ORAL_TABLET | Freq: Every day | ORAL | Status: DC
  Administered 2019-04-18 – 2019-04-20 (×3): 40 mg via ORAL
  Filled 2019-04-18 (×3): qty 2

## 2019-04-18 MED ORDER — SODIUM CHLORIDE 0.9 % IV SOLN
INTRAVENOUS | Status: AC
  Administered 2019-04-18: 02:00:00 via INTRAVENOUS

## 2019-04-18 MED ORDER — ENOXAPARIN SODIUM 40 MG/0.4ML ~~LOC~~ SOLN
40.0000 mg | SUBCUTANEOUS | Status: DC
  Administered 2019-04-18 – 2019-04-20 (×3): 40 mg via SUBCUTANEOUS
  Filled 2019-04-18 (×3): qty 0.4

## 2019-04-18 MED ORDER — SENNA 8.6 MG PO TABS
1.0000 | ORAL_TABLET | Freq: Two times a day (BID) | ORAL | Status: DC
  Filled 2019-04-18 (×4): qty 1

## 2019-04-18 MED ORDER — SODIUM CHLORIDE 0.9 % IV SOLN
500.0000 mg | INTRAVENOUS | Status: DC
  Administered 2019-04-18 – 2019-04-19 (×2): 500 mg via INTRAVENOUS
  Filled 2019-04-18 (×2): qty 500

## 2019-04-18 MED ORDER — SODIUM CHLORIDE 0.9 % IV SOLN
1.0000 g | INTRAVENOUS | Status: DC
  Filled 2019-04-18: qty 10

## 2019-04-18 NOTE — Progress Notes (Signed)
On call provider Baltazar Najjar notified via page that pt has been placed on airborne/contact precautions d/t unit protocol - pt was transferred to the designated covid positive unit area

## 2019-04-18 NOTE — Progress Notes (Signed)
On call provider Tylene Fantasia paged re: patient test has resulted to be covid negative.

## 2019-04-18 NOTE — Progress Notes (Signed)
Received report from ED & patient transferred to unit. Pt currently resting comfortably with no further needs at this time. Discussed patient transfer with charge RN regarding pending Covid test and transfer to Covid + unit. Covid test still pending.

## 2019-04-18 NOTE — Progress Notes (Signed)
PROGRESS NOTE    Erica Williamson  NID:[id number] DOB: July 21, 1990 DOA: 04/17/2019 PCP: Patient, No Pcp Per   Brief Narrative:  Erica Williamson is a 28 y.o. female with medical history significant of plasma cell mastitis  presented to ED on 04/17/2019 with 5-day complaint of shortness of breath and joint pain in arms and legs.  No fever, cough or any sick contact.  She was diagnosed with plasma cell mastitis during her hospitalization about 9 to 10 weeks ago and she also developed arthralgias and was placed on prednisone which she has been taking daily at 40 mg p.o. daily.  She called her PCP yesterday for her symptoms and she was referred to ER.Her last hospitalization was from 7/30 to 02/19/2019 at [facility] for nausea and vomiting. She was referred to [facility] rheumatology who declined referral and recommended hematology referral.   Reportedly her ANA titer was positive with homogeneous and speckled pattern 1:80 But Anti-smooth muscle antibody and lupus anticoagulant has been negative Complement C3 and C4 was both within normal limits. Tested negative for COVID in July. Respiratory panel was negative as well HIV nonreactive.  She was this time admitted under [facility] with the diagnosis of sepsis secondary to healthcare associated pneumonia and was started on antibiotics.  Assessment & Plan:   Active Problems:   Transient hypotension   Plasma cell mastitis of left breast   Tachycardia   Hyperglycemia   Polyarthralgia   HCAP (healthcare-associated pneumonia)   Sepsis ([place])  Sepsis secondary to healthcare associated pneumonia: She met sepsis criteria based on leukocytosis and tachycardia.  She meets H CAP criteria due to being hospitalized twice within last 3 months.  Her sepsis parameters have resolved.  She remains afebrile.  Interestingly, once again her procalcitonin, her ESR, urine antigen for strep pneumonia and influenza, all are negative.  Her COVID-19 is negative as well however  my suspicion is still remains high and thus I plan to repeat her testing next 24 to 48 hours but in the meantime, I will continue her azithromycin however I will discontinue Rocephin and switch to Zosyn.  Follow blood culture.  She is immunocompromise due to being on steroids for a long time and possibility of having immunological disorder so she needs to be hospitalized for continued IV antibiotics and further testing and clinical improvement.  Transient hypotension: In the emergency department however this has improved with hydration.  Stop IV fluids.  Plasma cell mastitis of left breast: Follow-up as outpatient.  Hyperglycemia: due to being on steroids.  Hemoglobin A1c only 5.5.  Polyarthralgia: Continue her home dose of prednisone.  She is arranging follow-up at [facility] with hematology.  Reportedly, prior rheumatological work-up did not show evidence of SLE.  DVT prophylaxis: Lovenox Code Status: Full code Family Communication:  None present at bedside.  Plan of care discussed with patient in length and he verbalized understanding and agreed with it. Disposition Plan: TBD  Consultants:   None  Procedures:   None  Antimicrobials:   IV Rocephin given on 04/17/2019  Azithromycin 04/17/2019>  Zosyn 04/18/2019   Subjective: Patient seen and examined.  She states that she feels better today.  Continues to have some chest pain and shortness of breath intermittently but better than yesterday.  No cough, fever, sick contact.  Objective: Vitals:   04/17/19 2345 04/18/19 0000 04/18/19 0043 04/18/19 0859  BP: 98/86 115/89 111/84 107/82  Pulse: (!) 101 94 93 97  Resp: (!) _0 Temp:   Marland Kitchen)  97.4 F (36.3 C) 98.1 F (36.7 C)  TempSrc:   Oral   SpO2: 98% 97% 100% 100%  Weight:   71.4 kg   Height:   '5\' 4"'$  (1.626 m)     Intake/Output Summary (Last 24 hours) at 04/18/2019 1108 Last data filed at 04/18/2019 0314 Gross per 24 hour  Intake 2676.23 ml  Output --  Net 2676.23 ml    Filed Weights   04/17/19 1643 04/18/19 0043  Weight: 66.4 kg 71.4 kg    Examination:  General exam: Appears calm and comfortable  Respiratory system: Scattered rhonchi. Respiratory effort normal. Cardiovascular system: S1 & S2 heard, RRR. No JVD, murmurs, rubs, gallops or clicks. No pedal edema. Gastrointestinal system: Abdomen is nondistended, soft and nontender. No organomegaly or masses felt. Normal bowel sounds heard. Central nervous system: Alert and oriented. No focal neurological deficits. Extremities: Symmetric 5 x 5 power. Skin: No rashes, lesions or ulcers Psychiatry: Judgement and insight appear normal. Mood & affect appropriate.    Data Reviewed: I have personally reviewed following labs and imaging studies  CBC: Recent Labs  Lab 04/17/19 1652 04/18/19 0207  WBC 22.1* 14.1*  NEUTROABS  --  8.4*  HGB 13.6 10.9*  HCT 41.6 33.7*  MCV 79.5* 78.7*  PLT 242 960   Basic Metabolic Panel: Recent Labs  Lab 04/17/19 1652 04/18/19 0207  NA 137 139  K 4.0 3.3*  CL 100 111  CO2 24 24  GLUCOSE 228* 105*  BUN 13 11  CREATININE 0.71 0.47  CALCIUM 9.2 7.9*  MG  --  1.9  PHOS  --  2.8   GFR: Estimated Creatinine Clearance: 100.6 mL/min (by C-G formula based on SCr of 0.47 mg/dL). Liver Function Tests: Recent Labs  Lab 04/18/19 0207  AST 16   15  ALT 25   25  ALKPHOS 62   60  BILITOT 0.5   0.4  PROT 5.3*   5.3*  ALBUMIN 2.4*   2.4*   No results for input(s): LIPASE, AMYLASE in the last 168 hours. No results for input(s): AMMONIA in the last 168 hours. Coagulation Profile: Recent Labs  Lab 04/18/19 0207  INR 1.0   Cardiac Enzymes: Recent Labs  Lab 04/17/19 2231  CKTOTAL 17*   BNP (last 3 results) No results for input(s): PROBNP in the last 8760 hours. HbA1C: Recent Labs    04/18/19 0207  HGBA1C 5.5   CBG: Recent Labs  Lab 04/18/19 0150 04/18/19 0905  GLUCAP 125* 71   Lipid Profile: Recent Labs    04/18/19 0207  CHOL 366*  HDL 58   LDLCALC 294*  TRIG 71  CHOLHDL 6.3   Thyroid Function Tests: Recent Labs    04/18/19 0207  TSH 1.090   Anemia Panel: No results for input(s): VITAMINB12, FOLATE, FERRITIN, TIBC, IRON, RETICCTPCT in the last 72 hours. Sepsis Labs: Recent Labs  Lab 04/17/19 2002 04/17/19 2231 04/18/19 0207  PROCALCITON  --   --  <0.10  LATICACIDVEN 2.2* 1.1  --     Recent Results (from the past 240 hour(s))  Blood Culture (routine x 2)     Status: None (Preliminary result)   Collection Time: 04/17/19  8:50 PM   Specimen: BLOOD  Result Value Ref Range Status   Specimen Description BLOOD SITE NOT SPECIFIED  Final   Special Requests   Final    BOTTLES DRAWN AEROBIC AND ANAEROBIC Blood Culture adequate volume   Culture   Final    NO GROWTH <  12 HOURS Performed at [facility], [place] [address], [place], [place] [address]    Report Status PENDING  Incomplete  Blood Culture (routine x 2)     Status: None (Preliminary result)   Collection Time: 04/17/19  9:00 PM   Specimen: BLOOD  Result Value Ref Range Status   Specimen Description BLOOD LEFT ANTECUBITAL  Final   Special Requests   Final    BOTTLES DRAWN AEROBIC ONLY Blood Culture results may not be optimal due to an inadequate volume of blood received in culture bottles   Culture   Final    NO GROWTH < 12 HOURS Performed at [facility], [place] [address], [place], [place] [address]    Report Status PENDING  Incomplete  SARS Coronavirus 2 [facility] order, Performed in [facility] hospital lab) Nasopharyngeal Nasopharyngeal Swab     Status: None   Collection Time: 04/17/19  9:25 PM   Specimen: Nasopharyngeal Swab  Result Value Ref Range Status   SARS Coronavirus 2 NEGATIVE NEGATIVE Final    Comment: (NOTE) If result is NEGATIVE SARS-CoV-2 target nucleic acids are NOT DETECTED. The SARS-CoV-2 RNA is generally detectable in upper and lower  respiratory specimens during the acute phase of infection. The lowest  concentration of  SARS-CoV-2 viral copies this assay can detect is 250  copies / mL. A negative result does not preclude SARS-CoV-2 infection  and should not be used as the sole basis for treatment or other  patient management decisions.  A negative result may occur with  improper specimen collection / handling, submission of specimen other  than nasopharyngeal swab, presence of viral mutation(s) within the  areas targeted by this assay, and inadequate number of viral copies  (<250 copies / mL). A negative result must be combined with clinical  observations, patient history, and epidemiological information. If result is POSITIVE SARS-CoV-2 target nucleic acids are DETECTED. The SARS-CoV-2 RNA is generally detectable in upper and lower  respiratory specimens dur ing the acute phase of infection.  Positive  results are indicative of active infection with SARS-CoV-2.  Clinical  correlation with patient history and other diagnostic information is  necessary to determine patient infection status.  Positive results do  not rule out bacterial infection or co-infection with other viruses. If result is PRESUMPTIVE POSTIVE SARS-CoV-2 nucleic acids MAY BE PRESENT.   A presumptive positive result was obtained on the submitted specimen  and confirmed on repeat testing.  While 2019 novel coronavirus  (SARS-CoV-2) nucleic acids may be present in the submitted sample  additional confirmatory testing may be necessary for epidemiological  and / or clinical management purposes  to differentiate between  SARS-CoV-2 and other Sarbecovirus currently known to infect humans.  If clinically indicated additional testing with an alternate test  methodology [phone]) is advised. The SARS-CoV-2 RNA is generally  detectable in upper and lower respiratory sp ecimens during the acute  phase of infection. The expected result is Negative. Fact Sheet for Patients:  StrictlyIdeas.no Fact Sheet for Healthcare  Providers: BankingDealers.co.za This test is not yet approved or cleared by the [place] FDA and has been authorized for detection and/or diagnosis of SARS-CoV-2 by FDA under an Emergency Use Authorization (EUA).  This EUA will remain in effect (meaning this test can be used) for the duration of the COVID-19 declaration under Section 564(b)(1) of the Act, 21 U.S.C. section 360bbb-3(b)(1), unless the authorization is terminated or revoked sooner. Performed at [facility], [place]  [address] [place], [place] [address]   Respiratory Panel by PCR     Status: None   Collection Time: 04/17/19  9:25 PM   Specimen: Nasopharyngeal Swab; Respiratory  Result Value Ref Range Status   Adenovirus NOT DETECTED NOT DETECTED Final   Coronavirus 229E NOT DETECTED NOT DETECTED Final    Comment: (NOTE) The Coronavirus on the Respiratory Panel, DOES NOT test for the novel  Coronavirus (2019 nCoV)    Coronavirus HKU1 NOT DETECTED NOT DETECTED Final   Coronavirus NL63 NOT DETECTED NOT DETECTED Final   Coronavirus OC43 NOT DETECTED NOT DETECTED Final   Metapneumovirus NOT DETECTED NOT DETECTED Final   Rhinovirus / Enterovirus NOT DETECTED NOT DETECTED Final   Influenza A NOT DETECTED NOT DETECTED Final   Influenza B NOT DETECTED NOT DETECTED Final   Parainfluenza Virus 1 NOT DETECTED NOT DETECTED Final   Parainfluenza Virus 2 NOT DETECTED NOT DETECTED Final   Parainfluenza Virus 3 NOT DETECTED NOT DETECTED Final   Parainfluenza Virus 4 NOT DETECTED NOT DETECTED Final   Respiratory Syncytial Virus NOT DETECTED NOT DETECTED Final   Bordetella pertussis NOT DETECTED NOT DETECTED Final   Chlamydophila pneumoniae NOT DETECTED NOT DETECTED Final   Mycoplasma pneumoniae NOT DETECTED NOT DETECTED Final    Comment: Performed at [facility], [address] [place], [place] [address]  MRSA PCR Screening     Status: None   Collection Time: 04/18/19  2:11 AM   Specimen: Nasal  Mucosa; Nasopharyngeal  Result Value Ref Range Status   MRSA by PCR NEGATIVE NEGATIVE Final    Comment:        The GeneXpert MRSA Assay (FDA approved for NASAL specimens only), is one component of a comprehensive MRSA colonization surveillance program. It is not intended to diagnose MRSA infection nor to guide or monitor treatment for MRSA infections. Performed at [facility], [place] [address] [place], [place] [address]       Radiology Studies: Dg Chest 2 View  Result Date: 04/17/2019 CLINICAL DATA:  Chest pain EXAM: CHEST - 2 VIEW COMPARISON:  02/14/2019 chest radiograph. FINDINGS: Stable cardiomediastinal silhouette with normal heart size. No pneumothorax. No pleural effusion. Hazy patchy opacities at both lung bases. No pulmonary edema. IMPRESSION: Hazy patchy opacities at both lung bases, suggestive of pneumonia. Electronically Signed   By: Ilona Sorrel M.D.   On: 04/17/2019 17:10    Scheduled Meds:  enoxaparin (LOVENOX) injection  40 mg Subcutaneous Q24H   guaiFENesin  600 mg Oral BID   [START ON 04/19/2019] influenza vac split quadrivalent PF  0.5 mL Intramuscular Tomorrow-1000   insulin aspart  0-15 Units Subcutaneous TID WC   insulin aspart  0-5 Units Subcutaneous QHS   predniSONE  40 mg Oral Q breakfast   senna  1 tablet Oral BID   sodium chloride flush  3 mL Intravenous Once   Continuous Infusions:  sodium chloride 75 mL/hr at 04/18/19 0200   azithromycin     cefTRIAXone (ROCEPHIN)  IV       LOS: 0 days   Time spent: 40 minutes   Darliss Cheney, MD [facility] Pager [phone]  If 7PM-7AM, please contact night-coverage [email or web address] Password [email or web address] 04/18/2019, 11:08 AM

## 2019-04-19 ENCOUNTER — Inpatient Hospital Stay (HOSPITAL_COMMUNITY): Payer: BC Managed Care – PPO

## 2019-04-19 LAB — CBC WITH DIFFERENTIAL/PLATELET
Abs Immature Granulocytes: 0.12 10*3/uL — ABNORMAL HIGH (ref 0.00–0.07)
Basophils Absolute: 0.1 10*3/uL (ref 0.0–0.1)
Basophils Relative: 0 %
Eosinophils Absolute: 0.1 10*3/uL (ref 0.0–0.5)
Eosinophils Relative: 1 %
HCT: 39.7 % (ref 36.0–46.0)
Hemoglobin: 12.4 g/dL (ref 12.0–15.0)
Immature Granulocytes: 1 %
Lymphocytes Relative: 35 %
Lymphs Abs: 5.7 10*3/uL — ABNORMAL HIGH (ref 0.7–4.0)
MCH: 24.8 pg — ABNORMAL LOW (ref 26.0–34.0)
MCHC: 31.2 g/dL (ref 30.0–36.0)
MCV: 79.6 fL — ABNORMAL LOW (ref 80.0–100.0)
Monocytes Absolute: 1 10*3/uL (ref 0.1–1.0)
Monocytes Relative: 6 %
Neutro Abs: 9.2 10*3/uL — ABNORMAL HIGH (ref 1.7–7.7)
Neutrophils Relative %: 57 %
Platelets: 236 10*3/uL (ref 150–400)
RBC: 4.99 MIL/uL (ref 3.87–5.11)
RDW: 17.2 % — ABNORMAL HIGH (ref 11.5–15.5)
WBC: 16.2 10*3/uL — ABNORMAL HIGH (ref 4.0–10.5)
nRBC: 0 % (ref 0.0–0.2)

## 2019-04-19 LAB — BASIC METABOLIC PANEL
Anion gap: 11 (ref 5–15)
BUN: 12 mg/dL (ref 6–20)
CO2: 23 mmol/L (ref 22–32)
Calcium: 9.2 mg/dL (ref 8.9–10.3)
Chloride: 106 mmol/L (ref 98–111)
Creatinine, Ser: 0.46 mg/dL (ref 0.44–1.00)
GFR calc Af Amer: >60 mL/min (ref >60)
GFR calc non Af Amer: >60 mL/min (ref >60)
Glucose, Bld: 84 mg/dL (ref 70–99)
Potassium: 3.2 mmol/L — ABNORMAL LOW (ref 3.5–5.1)
Sodium: 140 mmol/L (ref 135–145)

## 2019-04-19 LAB — LEGIONELLA PNEUMOPHILA SEROGP 1 UR AG: L. pneumophila Serogp 1 Ur Ag: NEGATIVE

## 2019-04-19 LAB — MAGNESIUM: Magnesium: 1.9 mg/dL (ref 1.7–2.4)

## 2019-04-19 LAB — GLUCOSE, CAPILLARY
Glucose-Capillary: 82 mg/dL (ref 70–99)
Glucose-Capillary: 123 mg/dL — ABNORMAL HIGH (ref 70–99)
Glucose-Capillary: 190 mg/dL — ABNORMAL HIGH (ref 70–99)
Glucose-Capillary: 154 mg/dL — ABNORMAL HIGH (ref 70–99)

## 2019-04-19 LAB — ALDOLASE: Aldolase: 3.9 U/L (ref 3.3–10.3)

## 2019-04-19 MED ORDER — FUROSEMIDE 10 MG/ML IJ SOLN
40.0000 mg | Freq: Once | INTRAMUSCULAR | Status: AC
  Administered 2019-04-19: 40 mg via INTRAVENOUS
  Filled 2019-04-19: qty 4

## 2019-04-19 MED ORDER — POTASSIUM CHLORIDE CRYS ER 20 MEQ PO TBCR
40.0000 meq | EXTENDED_RELEASE_TABLET | Freq: Four times a day (QID) | ORAL | Status: AC
  Administered 2019-04-19 (×2): 40 meq via ORAL
  Filled 2019-04-19 (×2): qty 2

## 2019-04-19 NOTE — Progress Notes (Signed)
PROGRESS NOTE    Erica Williamson  PQZ:[id number] DOB: [birth year] DOA: 04/17/2019 PCP: Patient, No Pcp Per   Brief Narrative:  Erica Williamson is a 28 y.o. female with medical history significant of plasma cell mastitis  presented to ED on 04/17/2019 with 5-day complaint of shortness of breath and joint pain in arms and legs.  No fever, cough or any sick contact.  She was diagnosed with plasma cell mastitis during her hospitalization about 9 to 10 weeks ago and she also developed arthralgias and was placed on prednisone which she has been taking daily at 40 mg p.o. daily.  She called her PCP yesterday for her symptoms and she was referred to ER.Her last hospitalization was from 7/30 to 02/19/2019 at [facility] for nausea and vomiting. She was referred to [facility] rheumatology who declined referral and recommended hematology referral.   Reportedly her ANA titer was positive with homogeneous and speckled pattern 1:80 But Anti-smooth muscle antibody and lupus anticoagulant has been negative Complement C3 and C4 was both within normal limits. Tested negative for COVID in July. Respiratory panel was negative as well HIV nonreactive.  She was this time admitted under [facility] with the diagnosis of sepsis secondary to healthcare associated pneumonia and was started on antibiotics.  Assessment & Plan:   Active Problems:   Transient hypotension   Plasma cell mastitis of left breast   Tachycardia   Hyperglycemia   Polyarthralgia   HCAP (healthcare-associated pneumonia)   Sepsis ([place])   CAP (community acquired pneumonia)  Sepsis secondary to healthcare associated pneumonia: She met sepsis criteria based on leukocytosis and tachycardia.  She meets H CAP criteria due to being hospitalized twice within last 3 months.  Her sepsis parameters have resolved.  She remains afebrile.  Interestingly, once again her procalcitonin, her ESR, urine antigen for strep pneumonia and influenza, all are negative.  Her  COVID-19 is negative.  She has improved since yesterday and she has not had any hypoxia despite of being on room air so at this point in time, I will repeat her chest x-ray and based on that I will make further decision whether we need to repeat her COVID-19 test or not.  In the meantime, continue current antibiotics and follow cultures which are negative thus far.   Transient hypotension: In the emergency department however this has improved with hydration.  Stop IV fluids.  Plasma cell mastitis of left breast: Follow-up as outpatient.  Hyperglycemia: due to being on steroids.  Hemoglobin A1c only 5.5.  Polyarthralgia: Continue her home dose of prednisone.  She told me today that she has been to rheumatologist as an outpatient and she has been through all sort of rheumatological disorder work-up and she was told that she does not have any immunological disorder.  She is arranging follow-up at [facility] with hematology.  Reportedly, prior rheumatological work-up did not show evidence of SLE.  DVT prophylaxis: Lovenox Code Status: Full code Family Communication:  None present at bedside.  Plan of care discussed with patient in length and he verbalized understanding and agreed with it. Disposition Plan: TBD  Consultants:   None  Procedures:   None  Antimicrobials:   IV Rocephin given on 04/17/2019  Azithromycin 04/17/2019>  Zosyn 04/18/2019   Subjective: Patient seen and examined.  She states that her breathing is better.  No other complaint today however she is not 100% comfortable going home yet.  Objective: Vitals:   04/18/19 2300 04/18/19 2348 04/19/19 0000 04/19/19 0700  BP:  116/76  115/85  Pulse:  (!) 110    Resp: '20 16 16   ' Temp:    98.5 F (36.9 C)  TempSrc:    Oral  SpO2:  99%  98%  Weight:      Height:        Intake/Output Summary (Last 24 hours) at 04/19/2019 1220 Last data filed at 04/19/2019 1000 Gross per 24 hour  Intake 730 ml  Output 2 ml  Net 728 ml    Filed Weights   04/17/19 1643 04/18/19 0043  Weight: 66.4 kg 71.4 kg    Examination:  General exam: Appears calm and comfortable  Respiratory system: Scattered rhonchi. Respiratory effort normal. Cardiovascular system: S1 & S2 heard, RRR. No JVD, murmurs, rubs, gallops or clicks. No pedal edema. Gastrointestinal system: Abdomen is nondistended, soft and nontender. No organomegaly or masses felt. Normal bowel sounds heard. Central nervous system: Alert and oriented. No focal neurological deficits. Extremities: Symmetric 5 x 5 power. Skin: No rashes, lesions or ulcers.  Psychiatry: Judgement and insight appear normal. Mood & affect appropriate.    Data Reviewed: I have personally reviewed following labs and imaging studies  CBC: Recent Labs  Lab 04/17/19 1652 04/18/19 0207 04/19/19 1000  WBC 22.1* 14.1* 16.2*  NEUTROABS  --  8.4* 9.2*  HGB 13.6 10.9* 12.4  HCT 41.6 33.7* 39.7  MCV 79.5* 78.7* 79.6*  PLT 242 210 778   Basic Metabolic Panel: Recent Labs  Lab 04/17/19 1652 04/18/19 0207 04/19/19 1000  NA 137 139 140  K 4.0 3.3* 3.2*  CL 100 111 106  CO2 '24 24 23  ' GLUCOSE 228* 105* 84  BUN '13 11 12  ' CREATININE 0.71 0.47 0.46  CALCIUM 9.2 7.9* 9.2  MG  --  1.9 1.9  PHOS  --  2.8  --    GFR: Estimated Creatinine Clearance: 100.6 mL/min (by C-G formula based on SCr of 0.46 mg/dL). Liver Function Tests: Recent Labs  Lab 04/18/19 0207  AST 16   15  ALT 25   25  ALKPHOS 62   60  BILITOT 0.5   0.4  PROT 5.3*   5.3*  ALBUMIN 2.4*   2.4*   No results for input(s): LIPASE, AMYLASE in the last 168 hours. No results for input(s): AMMONIA in the last 168 hours. Coagulation Profile: Recent Labs  Lab 04/18/19 0207  INR 1.0   Cardiac Enzymes: Recent Labs  Lab 04/17/19 2231  CKTOTAL 17*   BNP (last 3 results) No results for input(s): PROBNP in the last 8760 hours. HbA1C: Recent Labs    04/18/19 0207  HGBA1C 5.5   CBG: Recent Labs  Lab 04/18/19 0905  04/18/19 1141 04/18/19 1600 04/18/19 2109 04/19/19 0923  GLUCAP 71 82 182* 86 82   Lipid Profile: Recent Labs    04/18/19 0207  CHOL 366*  HDL 58  LDLCALC 294*  TRIG 71  CHOLHDL 6.3   Thyroid Function Tests: Recent Labs    04/18/19 0207  TSH 1.090   Anemia Panel: No results for input(s): VITAMINB12, FOLATE, FERRITIN, TIBC, IRON, RETICCTPCT in the last 72 hours. Sepsis Labs: Recent Labs  Lab 04/17/19 2002 04/17/19 2231 04/18/19 0207  PROCALCITON  --   --  <0.10  LATICACIDVEN 2.2* 1.1  --     Recent Results (from the past 240 hour(s))  Blood Culture (routine x 2)     Status: None (Preliminary result)   Collection Time: 04/17/19  8:50 PM   Specimen:  BLOOD  Result Value Ref Range Status   Specimen Description BLOOD SITE NOT SPECIFIED  Final   Special Requests   Final    BOTTLES DRAWN AEROBIC AND ANAEROBIC Blood Culture adequate volume   Culture   Final    NO GROWTH 2 DAYS Performed at [facility], [address], [place], [place] [address]    Report Status PENDING  Incomplete  Blood Culture (routine x 2)     Status: None (Preliminary result)   Collection Time: 04/17/19  9:00 PM   Specimen: BLOOD  Result Value Ref Range Status   Specimen Description BLOOD LEFT ANTECUBITAL  Final   Special Requests   Final    BOTTLES DRAWN AEROBIC ONLY Blood Culture results may not be optimal due to an inadequate volume of blood received in culture bottles   Culture   Final    NO GROWTH 2 DAYS Performed at [facility], [place] [address], [place], [place] [address]    Report Status PENDING  Incomplete  SARS Coronavirus 2 [facility] order, Performed in [facility] hospital lab) Nasopharyngeal Nasopharyngeal Swab     Status: None   Collection Time: 04/17/19  9:25 PM   Specimen: Nasopharyngeal Swab  Result Value Ref Range Status   SARS Coronavirus 2 NEGATIVE NEGATIVE Final    Comment: (NOTE) If result is NEGATIVE SARS-CoV-2 target nucleic acids are NOT DETECTED. The  SARS-CoV-2 RNA is generally detectable in upper and lower  respiratory specimens during the acute phase of infection. The lowest  concentration of SARS-CoV-2 viral copies this assay can detect is 250  copies / mL. A negative result does not preclude SARS-CoV-2 infection  and should not be used as the sole basis for treatment or other  patient management decisions.  A negative result may occur with  improper specimen collection / handling, submission of specimen other  than nasopharyngeal swab, presence of viral mutation(s) within the  areas targeted by this assay, and inadequate number of viral copies  (<250 copies / mL). A negative result must be combined with clinical  observations, patient history, and epidemiological information. If result is POSITIVE SARS-CoV-2 target nucleic acids are DETECTED. The SARS-CoV-2 RNA is generally detectable in upper and lower  respiratory specimens dur ing the acute phase of infection.  Positive  results are indicative of active infection with SARS-CoV-2.  Clinical  correlation with patient history and other diagnostic information is  necessary to determine patient infection status.  Positive results do  not rule out bacterial infection or co-infection with other viruses. If result is PRESUMPTIVE POSTIVE SARS-CoV-2 nucleic acids MAY BE PRESENT.   A presumptive positive result was obtained on the submitted specimen  and confirmed on repeat testing.  While 2019 novel coronavirus  (SARS-CoV-2) nucleic acids may be present in the submitted sample  additional confirmatory testing may be necessary for epidemiological  and / or clinical management purposes  to differentiate between  SARS-CoV-2 and other Sarbecovirus currently known to infect humans.  If clinically indicated additional testing with an alternate test  methodology [phone]) is advised. The SARS-CoV-2 RNA is generally  detectable in upper and lower respiratory sp ecimens during the acute    phase of infection. The expected result is Negative. Fact Sheet for Patients:  StrictlyIdeas.no Fact Sheet for Healthcare Providers: BankingDealers.co.za This test is not yet approved or cleared by the [place] FDA and has been authorized for detection and/or diagnosis of SARS-CoV-2 by FDA under an Emergency Use Authorization (EUA).  This EUA will remain in effect (meaning this test can be used) for the duration of the COVID-19 declaration under Section 564(b)(1) of the Act, 21 U.S.C. section 360bbb-3(b)(1), unless the authorization is terminated or revoked sooner. Performed at [facility], [place] [address], [place], [place] [address]   Respiratory Panel by PCR     Status: None   Collection Time: 04/17/19  9:25 PM   Specimen: Nasopharyngeal Swab; Respiratory  Result Value Ref Range Status   Adenovirus NOT DETECTED NOT DETECTED Final   Coronavirus 229E NOT DETECTED NOT DETECTED Final    Comment: (NOTE) The Coronavirus on the Respiratory Panel, DOES NOT test for the novel  Coronavirus (2019 nCoV)    Coronavirus HKU1 NOT DETECTED NOT DETECTED Final   Coronavirus NL63 NOT DETECTED NOT DETECTED Final   Coronavirus OC43 NOT DETECTED NOT DETECTED Final   Metapneumovirus NOT DETECTED NOT DETECTED Final   Rhinovirus / Enterovirus NOT DETECTED NOT DETECTED Final   Influenza A NOT DETECTED NOT DETECTED Final   Influenza B NOT DETECTED NOT DETECTED Final   Parainfluenza Virus 1 NOT DETECTED NOT DETECTED Final   Parainfluenza Virus 2 NOT DETECTED NOT DETECTED Final   Parainfluenza Virus 3 NOT DETECTED NOT DETECTED Final   Parainfluenza Virus 4 NOT DETECTED NOT DETECTED Final   Respiratory Syncytial Virus NOT DETECTED NOT DETECTED Final   Bordetella pertussis NOT DETECTED NOT DETECTED Final   Chlamydophila pneumoniae NOT DETECTED NOT DETECTED Final   Mycoplasma pneumoniae NOT DETECTED NOT DETECTED Final    Comment: Performed at [facility], [address], [place], [place] [address]  MRSA PCR Screening     Status: None   Collection Time: 04/18/19  2:11 AM   Specimen: Nasal Mucosa; Nasopharyngeal  Result Value Ref Range Status   MRSA by PCR NEGATIVE NEGATIVE Final    Comment:        The GeneXpert MRSA Assay (FDA approved for NASAL specimens only), is one component of a comprehensive MRSA colonization surveillance program. It is not intended to diagnose MRSA infection nor to guide or monitor treatment for MRSA infections. Performed at [facility], [place] [address], [place], [place] [address]       Radiology Studies: Dg Chest 2 View  Result Date: 04/17/2019 CLINICAL DATA:  Chest pain EXAM: CHEST - 2 VIEW COMPARISON:  02/14/2019 chest radiograph. FINDINGS: Stable cardiomediastinal silhouette with normal heart size. No pneumothorax. No pleural effusion. Hazy patchy opacities at both lung bases. No pulmonary edema. IMPRESSION: Hazy patchy opacities at both lung bases, suggestive of pneumonia. Electronically Signed   By: Ilona Sorrel M.D.   On: 04/17/2019 17:10    Scheduled Meds:  enoxaparin (LOVENOX) injection  40 mg Subcutaneous Q24H   guaiFENesin  600 mg Oral BID   influenza vac split quadrivalent PF  0.5 mL Intramuscular Tomorrow-1000   insulin aspart  0-15 Units Subcutaneous TID WC   insulin aspart  0-5 Units Subcutaneous QHS   predniSONE  40 mg Oral Q breakfast   senna  1 tablet Oral BID   sodium chloride flush  3 mL Intravenous Once   Continuous Infusions:  azithromycin 500 mg (04/18/19 2223)   piperacillin-tazobactam (ZOSYN)  IV 3.375 g (04/18/19 2313)     LOS: 1 day   Time spent: 29 minutes   Darliss Cheney, MD [facility] Pager [phone]  If 7PM-7AM, please contact night-coverage [email or web address] Password [email or web address] 04/19/2019, 12:20 PM

## 2019-04-20 LAB — CBC WITH DIFFERENTIAL/PLATELET
Abs Immature Granulocytes: 0.12 10*3/uL — ABNORMAL HIGH (ref 0.00–0.07)
Basophils Absolute: 0.1 10*3/uL (ref 0.0–0.1)
Basophils Relative: 0 %
Eosinophils Absolute: 0.1 10*3/uL (ref 0.0–0.5)
Eosinophils Relative: 1 %
HCT: 37.4 % (ref 36.0–46.0)
Hemoglobin: 12.1 g/dL (ref 12.0–15.0)
Immature Granulocytes: 1 %
Lymphocytes Relative: 21 %
Lymphs Abs: 3.8 10*3/uL (ref 0.7–4.0)
MCH: 25.5 pg — ABNORMAL LOW (ref 26.0–34.0)
MCHC: 32.4 g/dL (ref 30.0–36.0)
MCV: 78.7 fL — ABNORMAL LOW (ref 80.0–100.0)
Monocytes Absolute: 0.8 10*3/uL (ref 0.1–1.0)
Monocytes Relative: 5 %
Neutro Abs: 13.2 10*3/uL — ABNORMAL HIGH (ref 1.7–7.7)
Neutrophils Relative %: 72 %
Platelets: 216 10*3/uL (ref 150–400)
RBC: 4.75 MIL/uL (ref 3.87–5.11)
RDW: 17.2 % — ABNORMAL HIGH (ref 11.5–15.5)
WBC: 18.1 10*3/uL — ABNORMAL HIGH (ref 4.0–10.5)
nRBC: 0 % (ref 0.0–0.2)

## 2019-04-20 LAB — BASIC METABOLIC PANEL
Anion gap: 9 (ref 5–15)
BUN: 18 mg/dL (ref 6–20)
CO2: 26 mmol/L (ref 22–32)
Calcium: 9.1 mg/dL (ref 8.9–10.3)
Chloride: 103 mmol/L (ref 98–111)
Creatinine, Ser: 0.54 mg/dL (ref 0.44–1.00)
GFR calc Af Amer: >60 mL/min (ref >60)
GFR calc non Af Amer: >60 mL/min (ref >60)
Glucose, Bld: 118 mg/dL — ABNORMAL HIGH (ref 70–99)
Potassium: 3.1 mmol/L — ABNORMAL LOW (ref 3.5–5.1)
Sodium: 138 mmol/L (ref 135–145)

## 2019-04-20 LAB — MAGNESIUM: Magnesium: 1.9 mg/dL (ref 1.7–2.4)

## 2019-04-20 LAB — GLUCOSE, CAPILLARY
Glucose-Capillary: 80 mg/dL (ref 70–99)
Glucose-Capillary: 92 mg/dL (ref 70–99)

## 2019-04-20 MED ORDER — CEFDINIR 300 MG PO CAPS: 300.0000 mg | ORAL_CAPSULE | Freq: Two times a day (BID) | ORAL | 0 refills | Status: AC

## 2019-04-20 NOTE — Discharge Summary (Signed)
Physician Discharge Summary  Erica Williamson YQM:[id number] DOB: 1991/04/03 DOA: 04/17/2019  PCP: Patient, No Pcp Per  Admit date: 04/17/2019 Discharge date: 04/20/2019  Admitted From: Home Disposition: Home  Recommendations for Outpatient Follow-up:  1. Follow up with PCP in 1-2 weeks 2. Follow-up with hematology/oncology 3. Follow with rheumatology for further work-up 4. Please obtain BMP/CBC in one week 5. Please follow up on the following pending results:  Home Health: None Equipment/Devices: None  Discharge Condition: Stable CODE STATUS: Full code Diet recommendation: Regular  Subjective: Seen and examined.  Continues to feel well and in fact feels better than yesterday.  No shortness of breath.  No other complaint.  Brief/Interim Summary: Erica Williamson a 28 y.o.femalewith medical history significant of plasma cell mastitis presented to ED on 04/17/2019 with 5-day complaint of shortness of breath and joint pain in arms and legs.  No fever, cough or any sick contact.  She was diagnosed with plasma cell mastitis during her hospitalization about 9 to 10 weeks ago and she also developed arthralgias and was placed on prednisone which she has been taking daily at 40 mg p.o. daily.  She called her PCP yesterday for her symptoms and she was referred to ER.Her last hospitalization was from 7/30 to 02/19/2019 at [facility] for nausea and vomiting. She was referred to [facility] rheumatology who declined referral and recommended hematology referral.  Reportedly her ANA titer was positive with homogeneous and speckled pattern 1:80 ButAnti-smooth muscle antibody and lupus anticoagulant has been negative Complement C3 and C4 was both within normal limits. Tested negative for COVID in July. Respiratory panel was negative as well HIV nonreactive.  She was this time admitted under [facility] with the diagnosis of sepsis secondary to healthcare associated pneumonia and was started on empiric IV  antibiotics.  She was once again tested negative for COVID-19.  She required oxygen briefly up to 1 to 2 L only for comfort reasons however she has not had any hypoxia since more than 2 days and has improved significantly and her chest x-ray is improved as well.  Now that she is feeling well so she is going to be discharged home.  I will prescribe her 5 more days of oral Omnicef.  According to her, she has been through rheumatologist and that they had done all kind of work-up and that she was told she does not have any immunological/rheumatology disorder however she was on high-dose of prednisone at that point in time.  She tells me that her rheumatologist wanted her to be off of prednisone to see if her symptoms recur.  She did follow the recommendations and start her prednisone and within 12 hours of that, she started having body aches and could not tolerate it.  She also had speckled pattern ANA positive.  Based on all that, I still suspect that she probably has some sort of rheumatological disorder which is also causing recurrent pneumonia.  I asked her to see rheumatologist once again.  She is working on getting an appointment with hematology.  Follow-up with PCP next week.   Discharge Diagnoses:  Active Problems:   Transient hypotension   Plasma cell mastitis of left breast   Tachycardia   Hyperglycemia   Polyarthralgia   HCAP (healthcare-associated pneumonia)   Sepsis [facility])    Discharge Instructions  Discharge Instructions    Discharge patient   Complete by: As directed    Discharge disposition: 01-Home or Self Care   Discharge patient date: 04/20/2019  Allergies as of 04/20/2019   No Known Allergies     Medication List    TAKE these medications   cefdinir 300 MG capsule Commonly known as: OMNICEF Take 1 capsule (300 mg total) by mouth 2 (two) times daily for 5 days.   Contour Next Monitor w/Device Kit 1 each by Does not apply route daily before breakfast.   Contour  Next Test test strip Generic drug: glucose blood Use as instructed daily   ondansetron 4 MG disintegrating tablet Commonly known as: Zofran ODT Take 1 tablet (4 mg total) by mouth every 8 (eight) hours as needed for nausea or vomiting.   pantoprazole 40 MG tablet Commonly known as: PROTONIX Take 1 tablet (40 mg total) by mouth daily. What changed:   when to take this  reasons to take this   predniSONE 20 MG tablet Commonly known as: Deltasone Take 2 tablets (40 mg total) by mouth daily with breakfast.       No Known Allergies  Consultations: None   Procedures/Studies: Dg Chest 2 View  Result Date: 04/19/2019 CLINICAL DATA:  Healthcare associated pneumonia EXAM: CHEST - 2 VIEW COMPARISON:  04/17/2019 chest radiograph. FINDINGS: Stable cardiomediastinal silhouette with normal heart size. No pneumothorax. Slight blunting of the costophrenic angles bilaterally, cannot exclude trace pleural effusions, slightly increased on the left. No pulmonary edema. Hazy opacities at both lung bases are improved. IMPRESSION: Improved hazy opacities at both lung bases. Slight blunting of the costophrenic angles bilaterally, cannot exclude trace pleural effusions, slightly increased on the left. Electronically Signed   By: Ilona Sorrel M.D.   On: 04/19/2019 12:32   Dg Chest 2 View  Result Date: 04/17/2019 CLINICAL DATA:  Chest pain EXAM: CHEST - 2 VIEW COMPARISON:  02/14/2019 chest radiograph. FINDINGS: Stable cardiomediastinal silhouette with normal heart size. No pneumothorax. No pleural effusion. Hazy patchy opacities at both lung bases. No pulmonary edema. IMPRESSION: Hazy patchy opacities at both lung bases, suggestive of pneumonia. Electronically Signed   By: Ilona Sorrel M.D.   On: 04/17/2019 17:10      Discharge Exam: Vitals:   04/19/19 2317 04/20/19 0757  BP: 105/72 112/76  Pulse: 93 77  Resp: 18   Temp: 98 F (36.7 C) 98 F (36.7 C)  SpO2: 98% 100%   Vitals:   04/19/19 0700  04/19/19 1637 04/19/19 2317 04/20/19 0757  BP: 115/85 117/74 105/72 112/76  Pulse:  (!) 107 93 77  Resp:  18 18   Temp: 98.5 F (36.9 C) 98.2 F (36.8 C) 98 F (36.7 C) 98 F (36.7 C)  TempSrc: Oral Oral Oral Oral  SpO2: 98% 97% 98% 100%  Weight:      Height:        General: Pt is alert, awake, not in acute distress Cardiovascular: RRR, S1/S2 +, no rubs, no gallops Respiratory: CTA bilaterally, no wheezing, no rhonchi Abdominal: Soft, NT, ND, bowel sounds + Extremities: no edema, no cyanosis    The results of significant diagnostics from this hospitalization (including imaging, microbiology, ancillary and laboratory) are listed below for reference.     Microbiology: Recent Results (from the past 240 hour(s))  Blood Culture (routine x 2)     Status: None (Preliminary result)   Collection Time: 04/17/19  8:50 PM   Specimen: BLOOD  Result Value Ref Range Status   Specimen Description BLOOD SITE NOT SPECIFIED  Final   Special Requests   Final    BOTTLES DRAWN AEROBIC AND ANAEROBIC Blood Culture adequate volume  Culture   Final    NO GROWTH 2 DAYS Performed at [facility], [place] [address], [place], [place] [address]    Report Status PENDING  Incomplete  Blood Culture (routine x 2)     Status: None (Preliminary result)   Collection Time: 04/17/19  9:00 PM   Specimen: BLOOD  Result Value Ref Range Status   Specimen Description BLOOD LEFT ANTECUBITAL  Final   Special Requests   Final    BOTTLES DRAWN AEROBIC ONLY Blood Culture results may not be optimal due to an inadequate volume of blood received in culture bottles   Culture   Final    NO GROWTH 2 DAYS Performed at [facility], [place] [address], [place], [place] [address]    Report Status PENDING  Incomplete  SARS Coronavirus 2 [facility] order, Performed in [facility] hospital lab) Nasopharyngeal Nasopharyngeal Swab     Status: None   Collection Time: 04/17/19  9:25 PM   Specimen: Nasopharyngeal Swab   Result Value Ref Range Status   SARS Coronavirus 2 NEGATIVE NEGATIVE Final    Comment: (NOTE) If result is NEGATIVE SARS-CoV-2 target nucleic acids are NOT DETECTED. The SARS-CoV-2 RNA is generally detectable in upper and lower  respiratory specimens during the acute phase of infection. The lowest  concentration of SARS-CoV-2 viral copies this assay can detect is 250  copies / mL. A negative result does not preclude SARS-CoV-2 infection  and should not be used as the sole basis for treatment or other  patient management decisions.  A negative result may occur with  improper specimen collection / handling, submission of specimen other  than nasopharyngeal swab, presence of viral mutation(s) within the  areas targeted by this assay, and inadequate number of viral copies  (<250 copies / mL). A negative result must be combined with clinical  observations, patient history, and epidemiological information. If result is POSITIVE SARS-CoV-2 target nucleic acids are DETECTED. The SARS-CoV-2 RNA is generally detectable in upper and lower  respiratory specimens dur ing the acute phase of infection.  Positive  results are indicative of active infection with SARS-CoV-2.  Clinical  correlation with patient history and other diagnostic information is  necessary to determine patient infection status.  Positive results do  not rule out bacterial infection or co-infection with other viruses. If result is PRESUMPTIVE POSTIVE SARS-CoV-2 nucleic acids MAY BE PRESENT.   A presumptive positive result was obtained on the submitted specimen  and confirmed on repeat testing.  While 2019 novel coronavirus  (SARS-CoV-2) nucleic acids may be present in the submitted sample  additional confirmatory testing may be necessary for epidemiological  and / or clinical management purposes  to differentiate between  SARS-CoV-2 and other Sarbecovirus currently known to infect humans.  If clinically indicated additional  testing with an alternate test  methodology [phone]) is advised. The SARS-CoV-2 RNA is generally  detectable in upper and lower respiratory sp ecimens during the acute  phase of infection. The expected result is Negative. Fact Sheet for Patients:  StrictlyIdeas.no Fact Sheet for Healthcare Providers: BankingDealers.co.za This test is not yet approved or cleared by the [place] FDA and has been authorized for detection and/or diagnosis of SARS-CoV-2 by FDA under an Emergency Use Authorization (EUA).  This EUA will remain in effect (meaning this test can be used) for the duration of the COVID-19 declaration under Section 564(b)(1) of the Act, 21 U.S.C. section 360bbb-3(b)(1), unless the authorization is terminated or revoked sooner. Performed  at [facility], [place] [address], [place], [place] [address]   Respiratory Panel by PCR     Status: None   Collection Time: 04/17/19  9:25 PM   Specimen: Nasopharyngeal Swab; Respiratory  Result Value Ref Range Status   Adenovirus NOT DETECTED NOT DETECTED Final   Coronavirus 229E NOT DETECTED NOT DETECTED Final    Comment: (NOTE) The Coronavirus on the Respiratory Panel, DOES NOT test for the novel  Coronavirus (2019 nCoV)    Coronavirus HKU1 NOT DETECTED NOT DETECTED Final   Coronavirus NL63 NOT DETECTED NOT DETECTED Final   Coronavirus OC43 NOT DETECTED NOT DETECTED Final   Metapneumovirus NOT DETECTED NOT DETECTED Final   Rhinovirus / Enterovirus NOT DETECTED NOT DETECTED Final   Influenza A NOT DETECTED NOT DETECTED Final   Influenza B NOT DETECTED NOT DETECTED Final   Parainfluenza Virus 1 NOT DETECTED NOT DETECTED Final   Parainfluenza Virus 2 NOT DETECTED NOT DETECTED Final   Parainfluenza Virus 3 NOT DETECTED NOT DETECTED Final   Parainfluenza Virus 4 NOT DETECTED NOT DETECTED Final   Respiratory Syncytial Virus NOT DETECTED NOT DETECTED Final   Bordetella pertussis NOT  DETECTED NOT DETECTED Final   Chlamydophila pneumoniae NOT DETECTED NOT DETECTED Final   Mycoplasma pneumoniae NOT DETECTED NOT DETECTED Final    Comment: Performed at [facility], [address], [place], [place] [address]  MRSA PCR Screening     Status: None   Collection Time: 04/18/19  2:11 AM   Specimen: Nasal Mucosa; Nasopharyngeal  Result Value Ref Range Status   MRSA by PCR NEGATIVE NEGATIVE Final    Comment:        The GeneXpert MRSA Assay (FDA approved for NASAL specimens only), is one component of a comprehensive MRSA colonization surveillance program. It is not intended to diagnose MRSA infection nor to guide or monitor treatment for MRSA infections. Performed at [facility], [place] [address] [place], [place] [address]      Labs: BNP (last 3 results) No results for input(s): BNP in the last 8760 hours. Basic Metabolic Panel: Recent Labs  Lab 04/17/19 1652 04/18/19 0207 04/19/19 1000  NA 137 139 140  K 4.0 3.3* 3.2*  CL 100 111 106  CO2 '24 24 23  ' GLUCOSE 228* 105* 84  BUN '13 11 12  ' CREATININE 0.71 0.47 0.46  CALCIUM 9.2 7.9* 9.2  MG  --  1.9 1.9  PHOS  --  2.8  --    Liver Function Tests: Recent Labs  Lab 04/18/19 0207  AST 16  15  ALT 25  25  ALKPHOS 62  60  BILITOT 0.5  0.4  PROT 5.3*  5.3*  ALBUMIN 2.4*  2.4*   No results for input(s): LIPASE, AMYLASE in the last 168 hours. No results for input(s): AMMONIA in the last 168 hours. CBC: Recent Labs  Lab 04/17/19 1652 04/18/19 0207 04/19/19 1000  WBC 22.1* 14.1* 16.2*  NEUTROABS  --  8.4* 9.2*  HGB 13.6 10.9* 12.4  HCT 41.6 33.7* 39.7  MCV 79.5* 78.7* 79.6*  PLT 242 210 236   Cardiac Enzymes: Recent Labs  Lab 04/17/19 2231  CKTOTAL 17*   BNP: Invalid input(s): POCBNP CBG: Recent Labs  Lab 04/19/19 0923 04/19/19 1258 04/19/19 1608 04/19/19 2148 04/20/19 0756  GLUCAP 82 123* 190* 154* 80   D-Dimer No results for input(s): DDIMER in the last 72  hours. Hgb A1c Recent Labs    04/18/19 0207  HGBA1C 5.5   Lipid Profile Recent Labs    04/18/19 0207  CHOL 366*  HDL 58  LDLCALC 294*  TRIG 71  CHOLHDL 6.3   Thyroid function studies Recent Labs    04/18/19 0207  TSH 1.090   Anemia work up No results for input(s): VITAMINB12, FOLATE, FERRITIN, TIBC, IRON, RETICCTPCT in the last 72 hours. Urinalysis    Component Value Date/Time   COLORURINE YELLOW 04/18/2019 0915   APPEARANCEUR CLEAR 04/18/2019 0915   LABSPEC 1.019 04/18/2019 0915   PHURINE 6.0 04/18/2019 0915   GLUCOSEU NEGATIVE 04/18/2019 0915   HGBUR NEGATIVE 04/18/2019 0915   BILIRUBINUR NEGATIVE 04/18/2019 0915   KETONESUR NEGATIVE 04/18/2019 0915   PROTEINUR NEGATIVE 04/18/2019 0915   NITRITE NEGATIVE 04/18/2019 0915   LEUKOCYTESUR NEGATIVE 04/18/2019 0915   Sepsis Labs Invalid input(s): PROCALCITONIN,  WBC,  LACTICIDVEN Microbiology Recent Results (from the past 240 hour(s))  Blood Culture (routine x 2)     Status: None (Preliminary result)   Collection Time: 04/17/19  8:50 PM   Specimen: BLOOD  Result Value Ref Range Status   Specimen Description BLOOD SITE NOT SPECIFIED  Final   Special Requests   Final    BOTTLES DRAWN AEROBIC AND ANAEROBIC Blood Culture adequate volume   Culture   Final    NO GROWTH 2 DAYS Performed at [facility], [address], [place], [place] [address]    Report Status PENDING  Incomplete  Blood Culture (routine x 2)     Status: None (Preliminary result)   Collection Time: 04/17/19  9:00 PM   Specimen: BLOOD  Result Value Ref Range Status   Specimen Description BLOOD LEFT ANTECUBITAL  Final   Special Requests   Final    BOTTLES DRAWN AEROBIC ONLY Blood Culture results may not be optimal due to an inadequate volume of blood received in culture bottles   Culture   Final    NO GROWTH 2 DAYS Performed at [facility], [place] [address], [place], [place] [address]    Report Status PENDING  Incomplete  SARS  Coronavirus 2 [facility] order, Performed in [facility] hospital lab) Nasopharyngeal Nasopharyngeal Swab     Status: None   Collection Time: 04/17/19  9:25 PM   Specimen: Nasopharyngeal Swab  Result Value Ref Range Status   SARS Coronavirus 2 NEGATIVE NEGATIVE Final    Comment: (NOTE) If result is NEGATIVE SARS-CoV-2 target nucleic acids are NOT DETECTED. The SARS-CoV-2 RNA is generally detectable in upper and lower  respiratory specimens during the acute phase of infection. The lowest  concentration of SARS-CoV-2 viral copies this assay can detect is 250  copies / mL. A negative result does not preclude SARS-CoV-2 infection  and should not be used as the sole basis for treatment or other  patient management decisions.  A negative result may occur with  improper specimen collection / handling, submission of specimen other  than nasopharyngeal swab, presence of viral mutation(s) within the  areas targeted by this assay, and inadequate number of viral copies  (<250 copies / mL). A negative result must be combined with clinical  observations, patient history, and epidemiological information. If result is POSITIVE SARS-CoV-2 target nucleic acids are DETECTED. The SARS-CoV-2 RNA is generally detectable in upper and lower  respiratory specimens dur ing the acute phase of infection.  Positive  results are indicative of active infection with SARS-CoV-2.  Clinical  correlation with patient history and other diagnostic information is  necessary to  determine patient infection status.  Positive results do  not rule out bacterial infection or co-infection with other viruses. If result is PRESUMPTIVE POSTIVE SARS-CoV-2 nucleic acids MAY BE PRESENT.   A presumptive positive result was obtained on the submitted specimen  and confirmed on repeat testing.  While 2019 novel coronavirus  (SARS-CoV-2) nucleic acids may be present in the submitted sample  additional confirmatory testing may be necessary  for epidemiological  and / or clinical management purposes  to differentiate between  SARS-CoV-2 and other Sarbecovirus currently known to infect humans.  If clinically indicated additional testing with an alternate test  methodology [phone]) is advised. The SARS-CoV-2 RNA is generally  detectable in upper and lower respiratory sp ecimens during the acute  phase of infection. The expected result is Negative. Fact Sheet for Patients:  StrictlyIdeas.no Fact Sheet for Healthcare Providers: BankingDealers.co.za This test is not yet approved or cleared by the [place] FDA and has been authorized for detection and/or diagnosis of SARS-CoV-2 by FDA under an Emergency Use Authorization (EUA).  This EUA will remain in effect (meaning this test can be used) for the duration of the COVID-19 declaration under Section 564(b)(1) of the Act, 21 U.S.C. section 360bbb-3(b)(1), unless the authorization is terminated or revoked sooner. Performed at [facility], [place] [address], [place], [place] [address]   Respiratory Panel by PCR     Status: None   Collection Time: 04/17/19  9:25 PM   Specimen: Nasopharyngeal Swab; Respiratory  Result Value Ref Range Status   Adenovirus NOT DETECTED NOT DETECTED Final   Coronavirus 229E NOT DETECTED NOT DETECTED Final    Comment: (NOTE) The Coronavirus on the Respiratory Panel, DOES NOT test for the novel  Coronavirus (2019 nCoV)    Coronavirus HKU1 NOT DETECTED NOT DETECTED Final   Coronavirus NL63 NOT DETECTED NOT DETECTED Final   Coronavirus OC43 NOT DETECTED NOT DETECTED Final   Metapneumovirus NOT DETECTED NOT DETECTED Final   Rhinovirus / Enterovirus NOT DETECTED NOT DETECTED Final   Influenza A NOT DETECTED NOT DETECTED Final   Influenza B NOT DETECTED NOT DETECTED Final   Parainfluenza Virus 1 NOT DETECTED NOT DETECTED Final   Parainfluenza Virus 2 NOT DETECTED NOT DETECTED Final   Parainfluenza  Virus 3 NOT DETECTED NOT DETECTED Final   Parainfluenza Virus 4 NOT DETECTED NOT DETECTED Final   Respiratory Syncytial Virus NOT DETECTED NOT DETECTED Final   Bordetella pertussis NOT DETECTED NOT DETECTED Final   Chlamydophila pneumoniae NOT DETECTED NOT DETECTED Final   Mycoplasma pneumoniae NOT DETECTED NOT DETECTED Final    Comment: Performed at [facility], [address], [place], [place] [address]  MRSA PCR Screening     Status: None   Collection Time: 04/18/19  2:11 AM   Specimen: Nasal Mucosa; Nasopharyngeal  Result Value Ref Range Status   MRSA by PCR NEGATIVE NEGATIVE Final    Comment:        The GeneXpert MRSA Assay (FDA approved for NASAL specimens only), is one component of a comprehensive MRSA colonization surveillance program. It is not intended to diagnose MRSA infection nor to guide or monitor treatment for MRSA infections. Performed at [facility], [place] [address], [place], [place] [address]      Time coordinating discharge: Over 30 minutes  SIGNED:   Darliss Cheney, MD  [facility] 04/20/2019, 9:18 AM Pager [phone]  If 7PM-7AM, please contact night-coverage [email or web address] Password [email or web address]

## 2019-04-20 NOTE — Discharge Instructions (Signed)
Healthcare-Associated Pneumonia  Healthcare-associated pneumonia is a lung infection that a person can get when in a health care setting or during certain procedures. The infection causes air sacs inside the lungs to fill with pus or fluid. Healthcare-associated pneumonia is usually caused by bacteria that are common in health care settings. These bacteria may be resistant to some antibiotic medicines. What are the causes? This condition is caused by bacteria that get into your lungs. You can get this condition if you:  Breathe in droplets from an infected person's cough or sneeze.  Touch something that an infected person coughed or sneezed on and then touch your mouth, nose, or eyes.  Have a bacterial infection somewhere else in your body, if the bacteria spread to your lungs through your blood. What increases the risk? This condition is more likely to develop in people who:  Have a disease that weakens their body's defense system (immune system) or their ability to cough out germs.  Are older than age 30.  Having trouble swallowing.  Use a feeding or breathing tube.  Have a cold or the flu.  Have an IV tube inserted in a vein.  Have surgery.  Have a bed sore.  Live in a long-term care facility, such as a nursing home.  Were in the hospital for two or more days in the past 3 months.  Received hemodialysis in the past 30 days. What are the signs or symptoms? Symptoms of this condition include:  Fever.  Chills.  Cough.  Shortness of breath.  Wheezing or crackling sounds when breathing. How is this diagnosed? This condition may be diagnosed based on:  Your symptoms.  A chest X-ray.  A measurement of the amount of oxygen in your blood. How is this treated? This condition is treated with antibiotics. Your health care provider may take a sample of cells (culture) from your throat to determine what type of bacteria is in your lungs and change your antibiotic based  on the results. If you have bacteria in your blood, trouble breathing, or a low oxygen level, you may need to be treated at the hospital. At the hospital, you will be given antibiotics through an IV tube. You may also be given oxygen or breathing treatments. Follow these instructions at home: Medicine  Take your antibiotic medicine as told by your health care provider. Do not stop taking the antibiotic even if you start to feel better.  Take over-the-counter and other prescription medicines only as told by your health care provider. Activity  Rest at home until you feel better.  Return to your normal activities as told by your health care provider. Ask your health care provider what activities are safe for you. General instructions   Drink enough fluid to keep your urine clear or pale yellow.  Do not use any products that contain nicotine or tobacco, such as cigarettes and e-cigarettes. If you need help quitting, ask your health care provider.  Limit alcohol intake to no more than 1 drink per day for nonpregnant women and 2 drinks per day for men. One drink equals 12 oz of beer, 5 oz of wine, or 1 oz of hard liquor.  Keep all follow-up visits as told by your health care provider. This is important. How is this prevented? Actions that I can take To lower your risk of getting this condition again:  Do not smoke. This includes e-cigarettes.  Do not drink too much alcohol.  Keep your immune system healthy by eating  well and getting enough sleep.  Get a flu shot every year (annually).  Get a pneumonia vaccination if: ? You are older than age 74. ? You smoke. ? You have a long-lasting condition like lung disease.  Exercise your lungs by taking deep breaths, walking, and using an incentive spirometer as directed.  Wash your hands often with soap and water. If you cannot get to a sink to wash your hands, use an alcohol-based hand cleaner.  Make sure your health care providers are  washing their hands. If you do not see them wash their hands, ask them to do so.  When you are in a health care facility, avoid touching your eyes, nose, and mouth.  Avoid touching any surface near where people have coughed or sneezed.  Stand away from sick people when they are coughing or sneezing.  Wear a mask if you cannot avoid exposure to people who are sick.  Clean all surfaces often with a disinfectant cleaner, especially if someone is sick at home or work.  Precautions of my health care team Hospitals, nursing homes, and other health care facilities take special care to try to prevent healthcare-associated pneumonia. To do this, your health care team may:  Clean their hands with soap and water or with alcohol-based hand sanitizer before and after seeing patients.  Wear gloves or masks during treatment.  Sanitize medical instruments, tubes, other equipment, and surfaces in patient rooms.  Raise (elevate) the head of your hospital bed so you are not lying flat. The head of the bed may be elevated 30 degrees or more.  Have you sit up and move around as soon as possible after surgery.  Only insert a breathing tube if needed.  Do these things for you if you have a breathing tube: ? Clean the inside of your mouth regularly. ? Remove the breathing tube as soon as it is no longer needed. Contact a health care provider if:  Your symptoms do not get better or they get worse.  Your symptoms come back after you have finished taking your antibiotics. Get help right away if:  You have trouble breathing.  You have confusion or difficulty thinking. This information is not intended to replace advice given to you by your health care provider. Make sure you discuss any questions you have with your health care provider. Document Released: 11/24/2015 Document Revised: 06/16/2017 Document Reviewed: 04/01/2016 Elsevier Patient Education  2020 [employer].

## 2019-04-22 LAB — CULTURE, BLOOD (ROUTINE X 2)
Culture: NO GROWTH
Culture: NO GROWTH
Special Requests: ADEQUATE

## 2019-04-24 ENCOUNTER — Ambulatory Visit: Payer: BC Managed Care – PPO | Attending: Family Medicine | Admitting: Family Medicine

## 2019-04-24 ENCOUNTER — Other Ambulatory Visit: Payer: Self-pay

## 2019-04-24 DIAGNOSIS — M2550 Pain in unspecified joint: Secondary | ICD-10-CM | POA: Diagnosis not present

## 2019-04-24 DIAGNOSIS — Z79899 Other long term (current) drug therapy: Secondary | ICD-10-CM

## 2019-04-24 DIAGNOSIS — N6042 Mammary duct ectasia of left breast: Secondary | ICD-10-CM | POA: Diagnosis not present

## 2019-04-24 NOTE — Progress Notes (Signed)
Virtual Visit via Telephone Note  I connected with Erica Williamson, on 04/24/2019 at 1:33 PM by telephone due to the COVID-19 pandemic and verified that I am speaking with the correct person using two identifiers.   Consent: I discussed the limitations, risks, security and privacy concerns of performing an evaluation and management service by telephone and the availability of in person appointments. I also discussed with the patient that there may be a patient responsible charge related to this service. The patient expressed understanding and agreed to proceed.   Location of Patient: Home  Location of Provider: Clinic   Persons participating in Telemedicine visit: Philip [place] Ina Homes [place]-CMA Dr. Irene Shipper Latif - spouse    History of Present Illness: Erica Williamson is a 28 year old female with plasma cell mastitis, generalized arthralgias on chronic prednisone here for a follow up visit.  Hospitalized at [facility] from 9/30-10/3/20 for sepsis secondary to HCAP for which she initially required oxygen for a short while. Blood cultures revealed no growth, CXR in keeping with Pneumonia. She was treated with antibiotics with resulting improvement.  Her respiratory symptoms have resolved. She has one more day to go on her [employer] and is concerned about being sickly all of a sudden as prior to now she rarely had a cold. Referred to General surgery at [facility] and advised to continue observation and return in 3 months. During her hospitalization General surgery had recommended mastectomy if uncontrolled. [facility] and rheumatology declined referral. I also referred her to [facility] and she was advised to come off Prednisone for a short while which she attempted but has to resume it as her arthralgias became severe. EMG also planned as per Rheumatology notes from Care everywhere however the patient and her spouse do not want to proceed with  that test as they do not think it is a Neurological condition. Previous labs revealed Positive ANA with homogenous and speckled pattern 1:80, normal complement levels. She is wondering about a second opinion with Rheumatology and general surgery. Denies breast symptoms. She has been checking her blood sugars given she is on Prednisone and they have been normal.  No past medical history on file. No Known Allergies  Current Outpatient Medications on File Prior to Visit  Medication Sig Dispense Refill  . cefdinir (OMNICEF) 300 MG capsule Take 1 capsule (300 mg total) by mouth 2 (two) times daily for 5 days. 10 capsule 0  . predniSONE (DELTASONE) 20 MG tablet Take 2 tablets (40 mg total) by mouth daily with breakfast. 60 tablet 1  . Blood Glucose Monitoring Suppl (CONTOUR NEXT MONITOR) w/Device KIT 1 each by Does not apply route daily before breakfast. (Patient not taking: Reported on 04/24/2019) 1 kit 0  . glucose blood (CONTOUR NEXT TEST) test strip Use as instructed daily (Patient not taking: Reported on 04/24/2019) 100 each 12  . ondansetron (ZOFRAN ODT) 4 MG disintegrating tablet Take 1 tablet (4 mg total) by mouth every 8 (eight) hours as needed for nausea or vomiting. (Patient not taking: Reported on 04/17/2019) 30 tablet 0  . pantoprazole (PROTONIX) 40 MG tablet Take 1 tablet (40 mg total) by mouth daily. (Patient taking differently: Take 40 mg by mouth daily as needed (for reflux symptoms). ) 30 tablet 2   No current facility-administered medications on file prior to visit.     Observations/Objective: Alert, awake, oriented x3 Not in acute distress  Lab Results  Component Value Date   HGBA1C 5.5 04/18/2019  CMP Latest Ref Rng & Units 04/20/2019 04/19/2019 04/18/2019  Glucose 70 - 99 mg/dL 118(H) 84 -  BUN 6 - 20 mg/dL 18 12 -  Creatinine 0.44 - 1.00 mg/dL 0.54 0.46 -  Sodium 135 - 145 mmol/L 138 140 -  Potassium 3.5 - 5.1 mmol/L 3.1(L) 3.2(L) -  Chloride 98 - 111 mmol/L 103 106 -   CO2 22 - 32 mmol/L 26 23 -  Calcium 8.9 - 10.3 mg/dL 9.1 9.2 -  Total Protein 6.5 - 8.1 g/dL - - 5.3(L)  Total Bilirubin 0.3 - 1.2 mg/dL - - 0.5  Alkaline Phos 38 - 126 U/L - - 62  AST 15 - 41 U/L - - 16  ALT 0 - 44 U/L - - 25    Assessment and Plan: 1. Plasma cell mastitis of left breast No breast symptoms at this time - Ambulatory referral to Rheumatology - Ambulatory referral to General Surgery  2. Arthralgia, unspecified joint Currently on chronic prednisone Unknown underling etiology and this is frustrating for the couple. She likely has a Rheumatological disorder with her constellation of symptoms and recurrent pneumonia. Will refer for second opinion. We have also discussed looking into [facility] or [place] [facility] for any available resources and the patient and her spouse will be looking into this - CMP14+EGFR; Future - CBC with Differential/Platelet; Future - Ambulatory referral to Rheumatology - Ambulatory referral to General Surgery  3. High risk medication use Will need to monitor sugars given chronic Prednisone use A1c was 5.5 Low carb diet with reduced sweets, increase physical activity  Follow Up Instructions: Return in about 1 month (around 05/25/2019) for coordination of care. Repeat labs in 1 week due to hypokalemia.   I discussed the assessment and treatment plan with the patient. The patient was provided an opportunity to ask questions and all were answered. The patient agreed with the plan and demonstrated an understanding of the instructions.   The patient was advised to call back or seek an in-person evaluation if the symptoms worsen or if the condition fails to improve as anticipated.     I provided 26 minutes total of non-face-to-face time during this encounter including median intraservice time, reviewing previous notes, labs, imaging, medications, management and patient verbalized understanding.     Charlott Rakes, MD, FAAFP. [facility] [place], [place]   04/24/2019, 1:33 PM

## 2019-04-24 NOTE — Progress Notes (Signed)
Patient has been called and DOB has been verified. Patient has been screened and transferred to PCP to start phone visit.     

## 2019-04-25 ENCOUNTER — Encounter: Payer: Self-pay | Admitting: Family Medicine

## 2019-04-28 ENCOUNTER — Encounter: Payer: Self-pay | Admitting: Family Medicine

## 2019-04-29 MED ORDER — CEFDINIR 300 MG PO CAPS: 300.0000 mg | ORAL_CAPSULE | Freq: Two times a day (BID) | ORAL | 0 refills | Status: DC

## 2019-04-29 NOTE — Telephone Encounter (Signed)
Patient was given Omnicef at discharge from hospital on 04/20/2019. Patient is requesting more antibiotic due to her symptoms returning after finishing the [facility].

## 2019-05-02 ENCOUNTER — Other Ambulatory Visit: Payer: Self-pay

## 2019-05-02 ENCOUNTER — Ambulatory Visit: Payer: BC Managed Care – PPO | Attending: Family Medicine

## 2019-05-02 DIAGNOSIS — N611 Abscess of the breast and nipple: Secondary | ICD-10-CM | POA: Diagnosis not present

## 2019-05-02 DIAGNOSIS — M2550 Pain in unspecified joint: Secondary | ICD-10-CM

## 2019-05-03 LAB — CBC WITH DIFFERENTIAL/PLATELET
Basophils Absolute: 0.1 10*3/uL (ref 0.0–0.2)
Basos: 1 %
EOS (ABSOLUTE): 0.1 10*3/uL (ref 0.0–0.4)
Eos: 0 %
Hematocrit: 38.3 % (ref 34.0–46.6)
Hemoglobin: 12.3 g/dL (ref 11.1–15.9)
Immature Grans (Abs): 0.1 10*3/uL (ref 0.0–0.1)
Immature Granulocytes: 1 %
Lymphocytes Absolute: 6.4 10*3/uL — ABNORMAL HIGH (ref 0.7–3.1)
Lymphs: 33 %
MCH: 24.9 pg — ABNORMAL LOW (ref 26.6–33.0)
MCHC: 32.1 g/dL (ref 31.5–35.7)
MCV: 78 fL — ABNORMAL LOW (ref 79–97)
Monocytes Absolute: 1.2 10*3/uL — ABNORMAL HIGH (ref 0.1–0.9)
Monocytes: 6 %
Neutrophils Absolute: 11.7 10*3/uL — ABNORMAL HIGH (ref 1.4–7.0)
Neutrophils: 59 %
Platelets: 315 10*3/uL (ref 150–450)
RBC: 4.94 x10E6/uL (ref 3.77–5.28)
RDW: 15.6 % — ABNORMAL HIGH (ref 11.7–15.4)
WBC: 19.5 10*3/uL — ABNORMAL HIGH (ref 3.4–10.8)

## 2019-05-03 LAB — CMP14+EGFR
ALT: 24 IU/L (ref 0–32)
AST: 16 IU/L (ref 0–40)
Albumin/Globulin Ratio: 1.4 (ref 1.2–2.2)
Albumin: 3.9 g/dL (ref 3.9–5.0)
Alkaline Phosphatase: 85 IU/L (ref 39–117)
BUN/Creatinine Ratio: 17 (ref 9–23)
BUN: 11 mg/dL (ref 6–20)
Bilirubin Total: 0.3 mg/dL (ref 0.0–1.2)
CO2: 22 mmol/L (ref 20–29)
Calcium: 9.7 mg/dL (ref 8.7–10.2)
Chloride: 103 mmol/L (ref 96–106)
Creatinine, Ser: 0.65 mg/dL (ref 0.57–1.00)
GFR calc Af Amer: 139 mL/min/{1.73_m2} (ref >59)
GFR calc non Af Amer: 120 mL/min/{1.73_m2} (ref >59)
Globulin, Total: 2.7 g/dL (ref 1.5–4.5)
Glucose: 82 mg/dL (ref 65–99)
Potassium: 3.8 mmol/L (ref 3.5–5.2)
Sodium: 139 mmol/L (ref 134–144)
Total Protein: 6.6 g/dL (ref 6.0–8.5)

## 2019-05-08 ENCOUNTER — Other Ambulatory Visit: Payer: Self-pay

## 2019-05-08 ENCOUNTER — Emergency Department (HOSPITAL_COMMUNITY)
Admission: EM | Admit: 2019-05-08 | Discharge: 2019-05-09 | Disposition: A | Discharge status: Home / Self Care | Payer: BC Managed Care – PPO | Attending: Emergency Medicine | Admitting: Emergency Medicine

## 2019-05-08 ENCOUNTER — Encounter (HOSPITAL_COMMUNITY): Payer: Self-pay

## 2019-05-08 DIAGNOSIS — R0789 Other chest pain: Secondary | ICD-10-CM | POA: Diagnosis not present

## 2019-05-08 DIAGNOSIS — R079 Chest pain, unspecified: Secondary | ICD-10-CM | POA: Diagnosis not present

## 2019-05-08 DIAGNOSIS — Z20828 Contact with and (suspected) exposure to other viral communicable diseases: Secondary | ICD-10-CM | POA: Diagnosis not present

## 2019-05-08 DIAGNOSIS — R059 Cough, unspecified: Secondary | ICD-10-CM

## 2019-05-08 DIAGNOSIS — R05 Cough: Secondary | ICD-10-CM | POA: Insufficient documentation

## 2019-05-08 DIAGNOSIS — R509 Fever, unspecified: Secondary | ICD-10-CM | POA: Diagnosis not present

## 2019-05-08 LAB — CBC
HCT: 40.2 % (ref 36.0–46.0)
Hemoglobin: 12.5 g/dL (ref 12.0–15.0)
MCH: 24.6 pg — ABNORMAL LOW (ref 26.0–34.0)
MCHC: 31.1 g/dL (ref 30.0–36.0)
MCV: 79 fL — ABNORMAL LOW (ref 80.0–100.0)
Platelets: 296 10*3/uL (ref 150–400)
RBC: 5.09 MIL/uL (ref 3.87–5.11)
RDW: 17.1 % — ABNORMAL HIGH (ref 11.5–15.5)
WBC: 21.3 10*3/uL — ABNORMAL HIGH (ref 4.0–10.5)
nRBC: 0 % (ref 0.0–0.2)

## 2019-05-08 LAB — BASIC METABOLIC PANEL
Anion gap: 10 (ref 5–15)
BUN: 15 mg/dL (ref 6–20)
CO2: 22 mmol/L (ref 22–32)
Calcium: 9 mg/dL (ref 8.9–10.3)
Chloride: 105 mmol/L (ref 98–111)
Creatinine, Ser: 0.56 mg/dL (ref 0.44–1.00)
GFR calc Af Amer: >60 mL/min (ref >60)
GFR calc non Af Amer: >60 mL/min (ref >60)
Glucose, Bld: 173 mg/dL — ABNORMAL HIGH (ref 70–99)
Potassium: 3.9 mmol/L (ref 3.5–5.1)
Sodium: 137 mmol/L (ref 135–145)

## 2019-05-08 LAB — I-STAT BETA HCG BLOOD, ED (MC, WL, AP ONLY): I-stat hCG, quantitative: <5 m[IU]/mL (ref <5)

## 2019-05-08 LAB — TROPONIN I (HIGH SENSITIVITY)
Troponin I (High Sensitivity): 8 ng/L (ref <18)
Troponin I (High Sensitivity): 7 ng/L (ref <18)

## 2019-05-08 MED ORDER — SODIUM CHLORIDE 0.9% FLUSH: 3.0000 mL | Freq: Once | INTRAVENOUS | Status: DC

## 2019-05-08 NOTE — ED Triage Notes (Signed)
Pt reports chest pain, sob, cough and body aches, pt dx with pneumonia 3 weeks ago. Finished abx.

## 2019-05-09 ENCOUNTER — Emergency Department (HOSPITAL_COMMUNITY): Payer: BC Managed Care – PPO

## 2019-05-09 DIAGNOSIS — R509 Fever, unspecified: Secondary | ICD-10-CM | POA: Diagnosis not present

## 2019-05-09 DIAGNOSIS — R05 Cough: Secondary | ICD-10-CM | POA: Diagnosis not present

## 2019-05-09 LAB — SARS CORONAVIRUS 2 (TAT 6-24 HRS): SARS Coronavirus 2: NEGATIVE

## 2019-05-09 NOTE — Discharge Instructions (Addendum)
Your x-ray today shows no evidence of pneumonia.  Your labs are reassuring.  The prednisone can cause your white blood cell count to remain elevated.  Please follow-up with your doctor.  Return for new or worsening symptoms.  Your COVID test should result in the next 48 hours.  Please isolate until you have the results.  If positive, you will need to isolate for 7 days from now.

## 2019-05-09 NOTE — ED Provider Notes (Signed)
[facility] EMERGENCY DEPARTMENT Provider Note   CSN: [id number] Arrival date & time: 05/08/19  1546     History   Chief Complaint Chief Complaint  Patient presents with  . Chest Pain    HPI Erica Williamson is a 28 y.o. female.     Patient presents to the emergency department with a chief complaint of cough.  She reports associated shortness of breath and body aches.  She reports having had pneumonia 3 times in the past 3 months.  She states that she was admitted once for sepsis.  She has been on multiple rounds of antibiotics.  She states that she improves, but then finishes the antibiotics, and begins to worsen again.  She states that she is being worked up for autoimmune diseases, but no clear explanation for her recurrent pneumonia has been found to this point.  She states that she was feeling worse today, called her doctor, and was advised to return to the emergency department for repeat evaluation.  Denies any known coronavirus exposures.  Erica Williamson was evaluated in Emergency Department on 05/09/2019 for the symptoms described in the history of present illness. She was evaluated in the context of the global COVID-19 pandemic, which necessitated consideration that the patient might be at risk for infection with the SARS-CoV-2 virus that causes COVID-19. Institutional protocols and algorithms that pertain to the evaluation of patients at risk for COVID-19 are in a state of rapid change based on information released by regulatory bodies including the CDC and federal and state organizations. These policies and algorithms were followed during the patient's care in the ED.   The history is provided by the patient. No language interpreter was used.    History reviewed. No pertinent past medical history.  Patient Active Problem List   Diagnosis Date Noted  . CAP (community acquired pneumonia) 04/18/2019  . HCAP (healthcare-associated pneumonia)  04/17/2019  . Sepsis ([place]) 04/17/2019  . Polyarthralgia 03/06/2019  . Hyperglycemia 02/18/2019  . Plasma cell mastitis of left breast 02/15/2019  . Tachycardia 02/15/2019  . Nausea 02/15/2019  . Transient hypotension 01/26/2019  . Normocytic anemia 01/26/2019    Past Surgical History:  Procedure Laterality Date  . CESAREAN SECTION     X2     OB History    Gravida  1   Para      Term      Preterm      AB      Living        SAB      TAB      Ectopic      Multiple      Live Births               Home Medications    Prior to Admission medications   Medication Sig Start Date End Date Taking? Authorizing Provider  Blood Glucose Monitoring Suppl (CONTOUR NEXT MONITOR) w/Device KIT 1 each by Does not apply route daily before breakfast. Patient not taking: Reported on 04/24/2019 03/13/19   Charlott Rakes, MD  cefdinir (OMNICEF) 300 MG capsule Take 1 capsule (300 mg total) by mouth 2 (two) times daily. 04/29/19   Ladell Pier, MD  glucose blood (CONTOUR NEXT TEST) test strip Use as instructed daily Patient not taking: Reported on 04/24/2019 03/13/19   Charlott Rakes, MD  ondansetron (ZOFRAN ODT) 4 MG disintegrating tablet Take 1 tablet (4 mg total) by mouth every 8 (eight) hours as needed for nausea or  vomiting. Patient not taking: Reported on 04/17/2019 02/14/19   Hebron Callas, NP  pantoprazole (PROTONIX) 40 MG tablet Take 1 tablet (40 mg total) by mouth daily. Patient taking differently: Take 40 mg by mouth daily as needed (for reflux symptoms).  02/20/19 04/17/19  Radene Gunning, NP  predniSONE (DELTASONE) 20 MG tablet Take 2 tablets (40 mg total) by mouth daily with breakfast. 03/27/19   Charlott Rakes, MD    Family History Family History  Problem Relation Age of Onset  . Diabetes Neg Hx     Social History Social History   Tobacco Use  . Smoking status: Never Smoker  . Smokeless tobacco: Never Used  Substance Use Topics  . Alcohol use: Never     Frequency: Never  . Drug use: Never     Allergies   Patient has no known allergies.   Review of Systems Review of Systems  All other systems reviewed and are negative.    Physical Exam Updated Vital Signs BP 115/84 (BP Location: Right Arm)   Pulse 75   Temp 98.1 F (36.7 C) (Oral)   Resp 16   LMP 04/10/2019   SpO2 100%   Physical Exam Vitals signs and nursing note reviewed.  Constitutional:      General: She is not in acute distress.    Appearance: She is well-developed.  HENT:     Head: Normocephalic and atraumatic.  Eyes:     Conjunctiva/sclera: Conjunctivae normal.  Neck:     Musculoskeletal: Neck supple.  Cardiovascular:     Rate and Rhythm: Normal rate and regular rhythm.     Heart sounds: No murmur.  Pulmonary:     Effort: Pulmonary effort is normal. No respiratory distress.     Breath sounds: Normal breath sounds.  Abdominal:     Palpations: Abdomen is soft.     Tenderness: There is no abdominal tenderness.  Musculoskeletal: Normal range of motion.  Skin:    General: Skin is warm and dry.  Neurological:     Mental Status: She is alert and oriented to person, place, and time.  Psychiatric:        Mood and Affect: Mood normal.        Behavior: Behavior normal.      ED Treatments / Results  Labs (all labs ordered are listed, but only abnormal results are displayed) Labs Reviewed  BASIC METABOLIC PANEL - Abnormal; Notable for the following components:      Result Value   Glucose, Bld 173 (*)    All other components within normal limits  CBC - Abnormal; Notable for the following components:   WBC 21.3 (*)    MCV 79.0 (*)    MCH 24.6 (*)    RDW 17.1 (*)    All other components within normal limits  SARS CORONAVIRUS 2 (TAT 6-24 HRS)  I-STAT BETA HCG BLOOD, ED ([facility], [facility], [facility] ONLY)  TROPONIN I (HIGH SENSITIVITY)  TROPONIN I (HIGH SENSITIVITY)    EKG None  Radiology No results found.  Procedures Procedures (including critical care time)   Medications Ordered in ED Medications  sodium chloride flush (NS) 0.9 % injection 3 mL (has no administration in time range)     Initial Impression / Assessment and Plan / ED Course  I have reviewed the triage vital signs and the nursing notes.  Pertinent labs & imaging results that were available during my care of the patient were reviewed by me and considered in my medical decision  making (see chart for details).        Patient with history of recurrent pneumonia.  Here with cough and body aches.  Afebrile.  Will check Covid and chest x-ray.  Chest x-ray is negative.  Patient does have a leukocytosis, but she takes prednisone chronically.  Do not feel that further emergent work-up is indicated at this time.  DC to home with PCP follow-up.  Final Clinical Impressions(s) / ED Diagnoses   Final diagnoses:  Nonspecific chest pain  Cough    ED Discharge Orders    None       Montine Circle, PA-C 05/09/19 0205    Ripley Fraise, MD 05/09/19 [phone]

## 2019-05-13 DIAGNOSIS — R922 Inconclusive mammogram: Secondary | ICD-10-CM | POA: Diagnosis not present

## 2019-05-13 DIAGNOSIS — Z6827 Body mass index (BMI) 27.0-27.9, adult: Secondary | ICD-10-CM | POA: Diagnosis not present

## 2019-05-13 DIAGNOSIS — N6042 Mammary duct ectasia of left breast: Secondary | ICD-10-CM | POA: Diagnosis not present

## 2019-05-13 DIAGNOSIS — L929 Granulomatous disorder of the skin and subcutaneous tissue, unspecified: Secondary | ICD-10-CM | POA: Diagnosis not present

## 2019-05-13 DIAGNOSIS — N61 Mastitis without abscess: Secondary | ICD-10-CM | POA: Diagnosis not present

## 2019-05-28 ENCOUNTER — Ambulatory Visit: Payer: BC Managed Care – PPO | Admitting: Family Medicine

## 2019-05-28 DIAGNOSIS — Z6827 Body mass index (BMI) 27.0-27.9, adult: Secondary | ICD-10-CM | POA: Diagnosis not present

## 2019-05-28 DIAGNOSIS — N6120 Granulomatous mastitis, unspecified breast: Secondary | ICD-10-CM | POA: Diagnosis not present

## 2019-05-28 DIAGNOSIS — N6042 Mammary duct ectasia of left breast: Secondary | ICD-10-CM | POA: Diagnosis not present

## 2019-05-28 DIAGNOSIS — N6122 Granulomatous mastitis, left breast: Secondary | ICD-10-CM | POA: Diagnosis not present

## 2019-05-28 DIAGNOSIS — J189 Pneumonia, unspecified organism: Secondary | ICD-10-CM | POA: Diagnosis not present

## 2019-06-11 DIAGNOSIS — N6122 Granulomatous mastitis, left breast: Secondary | ICD-10-CM | POA: Diagnosis not present

## 2019-06-11 DIAGNOSIS — Z6828 Body mass index (BMI) 28.0-28.9, adult: Secondary | ICD-10-CM | POA: Diagnosis not present

## 2019-06-11 DIAGNOSIS — N6042 Mammary duct ectasia of left breast: Secondary | ICD-10-CM | POA: Diagnosis not present

## 2019-06-17 ENCOUNTER — Ambulatory Visit: Payer: BC Managed Care – PPO | Admitting: Family Medicine

## 2019-06-27 DIAGNOSIS — M79605 Pain in left leg: Secondary | ICD-10-CM | POA: Diagnosis not present

## 2019-06-27 DIAGNOSIS — M79604 Pain in right leg: Secondary | ICD-10-CM | POA: Diagnosis not present

## 2019-06-27 DIAGNOSIS — M792 Neuralgia and neuritis, unspecified: Secondary | ICD-10-CM | POA: Diagnosis not present

## 2019-07-01 ENCOUNTER — Other Ambulatory Visit: Payer: Self-pay

## 2019-07-01 ENCOUNTER — Encounter: Payer: Self-pay | Admitting: Family Medicine

## 2019-07-01 ENCOUNTER — Ambulatory Visit: Payer: BC Managed Care – PPO | Attending: Family Medicine | Admitting: Family Medicine

## 2019-07-01 VITALS — BP 120/81 | HR 93 | Temp 98.0°F | Ht 64.0 in | Wt 163.4 lb

## 2019-07-01 DIAGNOSIS — Z131 Encounter for screening for diabetes mellitus: Secondary | ICD-10-CM | POA: Diagnosis not present

## 2019-07-01 DIAGNOSIS — Z79899 Other long term (current) drug therapy: Secondary | ICD-10-CM | POA: Diagnosis not present

## 2019-07-01 DIAGNOSIS — N644 Mastodynia: Secondary | ICD-10-CM | POA: Diagnosis not present

## 2019-07-01 DIAGNOSIS — N6042 Mammary duct ectasia of left breast: Secondary | ICD-10-CM

## 2019-07-01 LAB — POCT GLYCOSYLATED HEMOGLOBIN (HGB A1C): HbA1c, POC (prediabetic range): 5.9 % (ref 5.7–6.4)

## 2019-07-01 NOTE — Progress Notes (Signed)
Established Patient Office Visit  Subjective:  Patient ID: Erica Williamson, female    DOB: 1991/05/16  Age: 28 y.o. MRN: [id number]  CC:  Chief Complaint  Patient presents with  . Breast Pain    HPI Erica Williamson is a 28 year old female with plasma cell mastitis, generalized arthralgias on chronic prednisone here for a follow up visit accompanied by her husband. She is currently on 60 mg of prednisone as prescribed by her surgical oncologist at [facility] and reports significant improvement in her breast symptoms with no pain and her arthralgias have improved.  Notes from care everywhere reviewed. She recently underwent a nerve conduction study at [facility] which was negative. Also has upcoming appointment with rheumatology at [facility] to evaluate a presumed vasculitis.  Attempt to secure rheumatology department at [facility] revealed next available appointment was not until 08/2019. Patient and her spouse are eager to know if there is an underlying rheumatological conditions which could explain the previous arthralgias she had. She has had no additional bouts of pneumonia.  History reviewed. No pertinent past medical history.  Past Surgical History:  Procedure Laterality Date  . CESAREAN SECTION     X2    Family History  Problem Relation Age of Onset  . Diabetes Neg Hx     Social History   Socioeconomic History  . Marital status: Married    Spouse name: Not on file  . Number of children: Not on file  . Years of education: Not on file  . Highest education level: Not on file  Occupational History  . Not on file  Tobacco Use  . Smoking status: Never Smoker  . Smokeless tobacco: Never Used  Substance and Sexual Activity  . Alcohol use: Never  . Drug use: Never  . Sexual activity: Yes  Other Topics Concern  . Not on file  Social History Narrative  . Not on file   Social Determinants of Health   Financial Resource Strain:   . Difficulty  of Paying Living Expenses: Not on file  Food Insecurity:   . Worried About Charity fundraiser in the Last Year: Not on file  . Ran Out of Food in the Last Year: Not on file  Transportation Needs:   . Lack of Transportation (Medical): Not on file  . Lack of Transportation (Non-Medical): Not on file  Physical Activity:   . Days of Exercise per Week: Not on file  . Minutes of Exercise per Session: Not on file  Stress:   . Feeling of Stress : Not on file  Social Connections:   . Frequency of Communication with Friends and Family: Not on file  . Frequency of Social Gatherings with Friends and Family: Not on file  . Attends Religious Services: Not on file  . Active Member of Clubs or Organizations: Not on file  . Attends Archivist Meetings: Not on file  . Marital Status: Not on file  Intimate Partner Violence:   . Fear of Current or Ex-Partner: Not on file  . Emotionally Abused: Not on file  . Physically Abused: Not on file  . Sexually Abused: Not on file    Outpatient Medications Prior to Visit  Medication Sig Dispense Refill  . predniSONE (DELTASONE) 20 MG tablet Take 2 tablets (40 mg total) by mouth daily with breakfast. 60 tablet 1  . Blood Glucose Monitoring Suppl (CONTOUR NEXT MONITOR) w/Device KIT 1 each by Does not apply route daily  before breakfast. (Patient not taking: Reported on 04/24/2019) 1 kit 0  . cefdinir (OMNICEF) 300 MG capsule Take 1 capsule (300 mg total) by mouth 2 (two) times daily. (Patient not taking: Reported on 07/01/2019) 10 capsule 0  . glucose blood (CONTOUR NEXT TEST) test strip Use as instructed daily (Patient not taking: Reported on 04/24/2019) 100 each 12  . ondansetron (ZOFRAN ODT) 4 MG disintegrating tablet Take 1 tablet (4 mg total) by mouth every 8 (eight) hours as needed for nausea or vomiting. (Patient not taking: Reported on 04/17/2019) 30 tablet 0  . pantoprazole (PROTONIX) 40 MG tablet Take 1 tablet (40 mg total) by mouth daily. (Patient  taking differently: Take 40 mg by mouth daily as needed (for reflux symptoms). ) 30 tablet 2   No facility-administered medications prior to visit.    No Known Allergies  ROS Review of Systems  Constitutional: Negative for activity change, appetite change and fatigue.  HENT: Negative for congestion, sinus pressure and sore throat.   Eyes: Negative for visual disturbance.  Respiratory: Negative for cough, chest tightness, shortness of breath and wheezing.   Cardiovascular: Negative for chest pain and palpitations.  Gastrointestinal: Negative for abdominal distention, abdominal pain and constipation.  Endocrine: Negative for polydipsia.  Genitourinary: Negative for dysuria and frequency.  Musculoskeletal: Negative for arthralgias and back pain.  Skin: Negative for rash.  Neurological: Negative for tremors, light-headedness and numbness.  Hematological: Does not bruise/bleed easily.  Psychiatric/Behavioral: Negative for agitation and behavioral problems.      Objective:    Physical Exam  Constitutional: She is oriented to person, place, and time. She appears well-developed and well-nourished.  Cardiovascular: Normal rate, normal heart sounds and intact distal pulses.  No murmur heard. Pulmonary/Chest: Effort normal and breath sounds normal. She has no wheezes. She has no rales. She exhibits no tenderness. Right breast exhibits mass (slight induration which has improved) and skin change (erythematous patches).  Abdominal: Soft. Bowel sounds are normal. She exhibits no distension and no mass. There is no abdominal tenderness.  Musculoskeletal:        General: Normal range of motion.  Neurological: She is alert and oriented to person, place, and time.    BP 120/81   Pulse 93   Temp 98 F (36.7 C) (Oral)   Ht '5\' 4"'  (1.626 m)   Wt 163 lb 6.4 oz (74.1 kg)   SpO2 99%   BMI 28.05 kg/m  Wt Readings from Last 3 Encounters:  07/01/19 163 lb 6.4 oz (74.1 kg)  04/18/19 157 lb 6.5 oz  (71.4 kg)  03/13/19 146 lb 6.4 oz (66.4 kg)     Health Maintenance Due  Topic Date Due  . TETANUS/TDAP  07/14/2008  . PAP-Cervical Cytology Screening  07/14/2010  . PAP SMEAR-Modifier  07/14/2010    There are no preventive care reminders to display for this patient.  Lab Results  Component Value Date   TSH 1.090 04/18/2019   Lab Results  Component Value Date   WBC 21.3 (H) 05/08/2019   HGB 12.5 05/08/2019   HCT 40.2 05/08/2019   MCV 79.0 (L) 05/08/2019   PLT 296 05/08/2019   Lab Results  Component Value Date   NA 137 05/08/2019   K 3.9 05/08/2019   CO2 22 05/08/2019   GLUCOSE 173 (H) 05/08/2019   BUN 15 05/08/2019   CREATININE 0.56 05/08/2019   BILITOT 0.3 05/02/2019   ALKPHOS 85 05/02/2019   AST 16 05/02/2019   ALT 24 05/02/2019  PROT 6.6 05/02/2019   ALBUMIN 3.9 05/02/2019   CALCIUM 9.0 05/08/2019   ANIONGAP 10 05/08/2019   Lab Results  Component Value Date   CHOL 366 (H) 04/18/2019   Lab Results  Component Value Date   HDL 58 04/18/2019   Lab Results  Component Value Date   LDLCALC 294 (H) 04/18/2019   Lab Results  Component Value Date   TRIG 71 04/18/2019   Lab Results  Component Value Date   CHOLHDL 6.3 04/18/2019   Lab Results  Component Value Date   HGBA1C 5.9 07/01/2019      Assessment & Plan:   1. High risk medication use Currently on prednisone - Vitamin D, 25-hydroxy - Complete Metabolic Panel with GFR - CBC with Differential/Platelet  2. Plasma cell mastitis of left breast Improved induration on current dose of prednisone She will be following up with her surgical oncologist We will also be seeing rheumatology at [place] to keep appointment with rheumatology at [place] with Differential/Platelet  3. Screening for diabetes mellitus Due to ongoing prednisone use we will need to screen for diabetes A1c is normal at 5.9 She will continue her blood sugar checks at home - HgB  A1c  Follow-up: Return in about 3 months (around 09/29/2019) for chronic medical conditions.    Charlott Rakes, MD

## 2019-07-01 NOTE — Patient Instructions (Signed)
Healthy Eating Following a healthy eating pattern may help you to achieve and maintain a healthy body weight, reduce the risk of chronic disease, and live a long and productive life. It is important to follow a healthy eating pattern at an appropriate calorie level for your body. Your nutritional needs should be met primarily through food by choosing a variety of nutrient-rich foods. What are tips for following this plan? Reading food labels  Read labels and choose the following: ? Reduced or low sodium. ? Juices with 100% fruit juice. ? Foods with low saturated fats and high polyunsaturated and monounsaturated fats. ? Foods with whole grains, such as whole wheat, cracked wheat, brown rice, and wild rice. ? Whole grains that are fortified with folic acid. This is recommended for women who are pregnant or who want to become pregnant.  Read labels and avoid the following: ? Foods with a lot of added sugars. These include foods that contain brown sugar, corn sweetener, corn syrup, dextrose, fructose, glucose, high-fructose corn syrup, honey, invert sugar, lactose, malt syrup, maltose, molasses, raw sugar, sucrose, trehalose, or turbinado sugar.  Do not eat more than the following amounts of added sugar per day:  6 teaspoons (25 g) for women.  9 teaspoons (38 g) for men. ? Foods that contain processed or refined starches and grains. ? Refined grain products, such as white flour, degermed cornmeal, white bread, and white rice. Shopping  Choose nutrient-rich snacks, such as vegetables, whole fruits, and nuts. Avoid high-calorie and high-sugar snacks, such as potato chips, fruit snacks, and candy.  Use oil-based dressings and spreads on foods instead of solid fats such as butter, stick margarine, or cream cheese.  Limit pre-made sauces, mixes, and "instant" products such as flavored rice, instant noodles, and ready-made pasta.  Try more plant-protein sources, such as tofu, tempeh, black beans,  edamame, lentils, nuts, and seeds.  Explore eating plans such as the Mediterranean diet or vegetarian diet. Cooking  Use oil to saut or stir-fry foods instead of solid fats such as butter, stick margarine, or lard.  Try baking, boiling, grilling, or broiling instead of frying.  Remove the fatty part of meats before cooking.  Steam vegetables in water or broth. Meal planning   At meals, imagine dividing your plate into fourths: ? One-half of your plate is fruits and vegetables. ? One-fourth of your plate is whole grains. ? One-fourth of your plate is protein, especially lean meats, poultry, eggs, tofu, beans, or nuts.  Include low-fat dairy as part of your daily diet. Lifestyle  Choose healthy options in all settings, including home, work, school, restaurants, or stores.  Prepare your food safely: ? Wash your hands after handling raw meats. ? Keep food preparation surfaces clean by regularly washing with hot, soapy water. ? Keep raw meats separate from ready-to-eat foods, such as fruits and vegetables. ? Cook seafood, meat, poultry, and eggs to the recommended internal temperature. ? Store foods at safe temperatures. In general:  Keep cold foods at 59F (4.4C) or below.  Keep hot foods at 159F (60C) or above.  Keep your freezer at [facility] (-17.8C) or below.  Foods are no longer safe to eat when they have been between the temperatures of 40-159F (4.4-60C) for more than 2 hours. What foods should I eat? Fruits Aim to eat 2 cup-equivalents of fresh, canned (in natural juice), or frozen fruits each day. Examples of 1 cup-equivalent of fruit include 1 small apple, 8 large strawberries, 1 cup canned fruit,  cup  dried fruit, or 1 cup 100% juice. Vegetables Aim to eat 2-3 cup-equivalents of fresh and frozen vegetables each day, including different varieties and colors. Examples of 1 cup-equivalent of vegetables include 2 medium carrots, 2 cups raw, leafy greens, 1 cup chopped  vegetable (raw or cooked), or 1 medium baked potato. Grains Aim to eat 6 ounce-equivalents of whole grains each day. Examples of 1 ounce-equivalent of grains include 1 slice of bread, 1 cup ready-to-eat cereal, 3 cups popcorn, or  cup cooked rice, pasta, or cereal. Meats and other proteins Aim to eat 5-6 ounce-equivalents of protein each day. Examples of 1 ounce-equivalent of protein include 1 egg, 1/2 cup nuts or seeds, or 1 tablespoon (16 g) peanut butter. A cut of meat or fish that is the size of a deck of cards is about 3-4 ounce-equivalents.  Of the protein you eat each week, try to have at least 8 ounces come from seafood. This includes salmon, trout, herring, and anchovies. Dairy Aim to eat 3 cup-equivalents of fat-free or low-fat dairy each day. Examples of 1 cup-equivalent of dairy include 1 cup (240 mL) milk, 8 ounces (250 g) yogurt, 1 ounces (44 g) natural cheese, or 1 cup (240 mL) fortified soy milk. Fats and oils  Aim for about 5 teaspoons (21 g) per day. Choose monounsaturated fats, such as canola and olive oils, avocados, peanut butter, and most nuts, or polyunsaturated fats, such as sunflower, corn, and soybean oils, walnuts, pine nuts, sesame seeds, sunflower seeds, and flaxseed. Beverages  Aim for six 8-oz glasses of water per day. Limit coffee to three to five 8-oz cups per day.  Limit caffeinated beverages that have added calories, such as soda and energy drinks.  Limit alcohol intake to no more than 1 drink a day for nonpregnant women and 2 drinks a day for men. One drink equals 12 oz of beer (355 mL), 5 oz of wine (148 mL), or 1 oz of hard liquor (44 mL). Seasoning and other foods  Avoid adding excess amounts of salt to your foods. Try flavoring foods with herbs and spices instead of salt.  Avoid adding sugar to foods.  Try using oil-based dressings, sauces, and spreads instead of solid fats. This information is based on general [place] nutrition guidelines. For more  information, visit choosemyplate.gov. Exact amounts may vary based on your nutrition needs. Summary  A healthy eating plan may help you to maintain a healthy weight, reduce the risk of chronic diseases, and stay active throughout your life.  Plan your meals. Make sure you eat the right portions of a variety of nutrient-rich foods.  Try baking, boiling, grilling, or broiling instead of frying.  Choose healthy options in all settings, including home, work, school, restaurants, or stores. This information is not intended to replace advice given to you by your health care provider. Make sure you discuss any questions you have with your health care provider. Document Released: 10/16/2017 Document Revised: 10/16/2017 Document Reviewed: 10/16/2017 Elsevier Patient Education  2020 [employer]  

## 2019-07-02 ENCOUNTER — Other Ambulatory Visit: Payer: Self-pay | Admitting: Family Medicine

## 2019-07-02 LAB — CBC WITH DIFFERENTIAL/PLATELET
Basophils Absolute: 0.1 10*3/uL (ref 0.0–0.2)
Basos: 0 %
EOS (ABSOLUTE): 0 10*3/uL (ref 0.0–0.4)
Eos: 0 %
Hematocrit: 40.9 % (ref 34.0–46.6)
Hemoglobin: 13 g/dL (ref 11.1–15.9)
Immature Grans (Abs): 0.2 10*3/uL — ABNORMAL HIGH (ref 0.0–0.1)
Immature Granulocytes: 1 %
Lymphocytes Absolute: 2.6 10*3/uL (ref 0.7–3.1)
Lymphs: 12 %
MCH: 23.5 pg — ABNORMAL LOW (ref 26.6–33.0)
MCHC: 31.8 g/dL (ref 31.5–35.7)
MCV: 74 fL — ABNORMAL LOW (ref 79–97)
Monocytes Absolute: 0.9 10*3/uL (ref 0.1–0.9)
Monocytes: 4 %
Neutrophils Absolute: 18.4 10*3/uL — ABNORMAL HIGH (ref 1.4–7.0)
Neutrophils: 83 %
Platelets: 300 10*3/uL (ref 150–450)
RBC: 5.53 x10E6/uL — ABNORMAL HIGH (ref 3.77–5.28)
RDW: 17.6 % — ABNORMAL HIGH (ref 11.7–15.4)
WBC: 22.2 10*3/uL (ref 3.4–10.8)

## 2019-07-02 LAB — CMP14+EGFR
ALT: 23 IU/L (ref 0–32)
AST: 17 IU/L (ref 0–40)
Albumin/Globulin Ratio: 1.9 (ref 1.2–2.2)
Albumin: 4.1 g/dL (ref 3.9–5.0)
Alkaline Phosphatase: 67 IU/L (ref 39–117)
BUN/Creatinine Ratio: 20 (ref 9–23)
BUN: 14 mg/dL (ref 6–20)
Bilirubin Total: 0.4 mg/dL (ref 0.0–1.2)
CO2: 22 mmol/L (ref 20–29)
Calcium: 9.7 mg/dL (ref 8.7–10.2)
Chloride: 100 mmol/L (ref 96–106)
Creatinine, Ser: 0.69 mg/dL (ref 0.57–1.00)
GFR calc Af Amer: 136 mL/min/{1.73_m2} (ref >59)
GFR calc non Af Amer: 118 mL/min/{1.73_m2} (ref >59)
Globulin, Total: 2.2 g/dL (ref 1.5–4.5)
Glucose: 78 mg/dL (ref 65–99)
Potassium: 4.3 mmol/L (ref 3.5–5.2)
Sodium: 137 mmol/L (ref 134–144)
Total Protein: 6.3 g/dL (ref 6.0–8.5)

## 2019-07-02 LAB — VITAMIN D 25 HYDROXY (VIT D DEFICIENCY, FRACTURES): Vit D, 25-Hydroxy: 11.7 ng/mL — ABNORMAL LOW (ref 30.0–100.0)

## 2019-07-02 MED ORDER — ERGOCALCIFEROL 1.25 MG (50000 UT) PO CAPS: 50000.0000 [IU] | ORAL_CAPSULE | ORAL | 1 refills | Status: AC

## 2019-07-09 DIAGNOSIS — N6122 Granulomatous mastitis, left breast: Secondary | ICD-10-CM | POA: Diagnosis not present

## 2019-07-09 DIAGNOSIS — Z6827 Body mass index (BMI) 27.0-27.9, adult: Secondary | ICD-10-CM | POA: Diagnosis not present

## 2019-07-09 DIAGNOSIS — N6042 Mammary duct ectasia of left breast: Secondary | ICD-10-CM | POA: Diagnosis not present

## 2019-07-29 DIAGNOSIS — N6042 Mammary duct ectasia of left breast: Secondary | ICD-10-CM | POA: Diagnosis not present

## 2019-07-29 DIAGNOSIS — G8929 Other chronic pain: Secondary | ICD-10-CM | POA: Diagnosis not present

## 2019-08-15 DIAGNOSIS — M25541 Pain in joints of right hand: Secondary | ICD-10-CM | POA: Diagnosis not present

## 2019-08-15 DIAGNOSIS — N6042 Mammary duct ectasia of left breast: Secondary | ICD-10-CM | POA: Diagnosis not present

## 2019-08-15 DIAGNOSIS — M25542 Pain in joints of left hand: Secondary | ICD-10-CM | POA: Diagnosis not present

## 2019-08-15 DIAGNOSIS — N6122 Granulomatous mastitis, left breast: Secondary | ICD-10-CM | POA: Diagnosis not present

## 2019-08-15 DIAGNOSIS — Z6827 Body mass index (BMI) 27.0-27.9, adult: Secondary | ICD-10-CM | POA: Diagnosis not present

## 2019-09-02 ENCOUNTER — Encounter (HOSPITAL_COMMUNITY): Payer: Self-pay | Admitting: Emergency Medicine

## 2019-09-02 ENCOUNTER — Other Ambulatory Visit: Payer: Self-pay

## 2019-09-02 ENCOUNTER — Emergency Department (HOSPITAL_COMMUNITY)
Admission: EM | Admit: 2019-09-02 | Discharge: 2019-09-03 | Disposition: A | Discharge status: Home / Self Care | Payer: BC Managed Care – PPO | Attending: Emergency Medicine | Admitting: Emergency Medicine

## 2019-09-02 ENCOUNTER — Emergency Department (HOSPITAL_COMMUNITY): Payer: BC Managed Care – PPO

## 2019-09-02 DIAGNOSIS — R079 Chest pain, unspecified: Secondary | ICD-10-CM

## 2019-09-02 DIAGNOSIS — R0789 Other chest pain: Secondary | ICD-10-CM | POA: Insufficient documentation

## 2019-09-02 DIAGNOSIS — Z7952 Long term (current) use of systemic steroids: Secondary | ICD-10-CM | POA: Diagnosis not present

## 2019-09-02 DIAGNOSIS — Z8701 Personal history of pneumonia (recurrent): Secondary | ICD-10-CM | POA: Diagnosis not present

## 2019-09-02 DIAGNOSIS — R42 Dizziness and giddiness: Secondary | ICD-10-CM | POA: Insufficient documentation

## 2019-09-02 DIAGNOSIS — R0781 Pleurodynia: Secondary | ICD-10-CM | POA: Diagnosis not present

## 2019-09-02 DIAGNOSIS — R071 Chest pain on breathing: Secondary | ICD-10-CM | POA: Insufficient documentation

## 2019-09-02 DIAGNOSIS — N6042 Mammary duct ectasia of left breast: Secondary | ICD-10-CM | POA: Diagnosis not present

## 2019-09-02 DIAGNOSIS — R0602 Shortness of breath: Secondary | ICD-10-CM | POA: Insufficient documentation

## 2019-09-02 LAB — BASIC METABOLIC PANEL
Anion gap: 12 (ref 5–15)
BUN: 13 mg/dL (ref 6–20)
CO2: 21 mmol/L — ABNORMAL LOW (ref 22–32)
Calcium: 9.6 mg/dL (ref 8.9–10.3)
Chloride: 106 mmol/L (ref 98–111)
Creatinine, Ser: 0.43 mg/dL — ABNORMAL LOW (ref 0.44–1.00)
GFR calc Af Amer: >60 mL/min (ref >60)
GFR calc non Af Amer: >60 mL/min (ref >60)
Glucose, Bld: 101 mg/dL — ABNORMAL HIGH (ref 70–99)
Potassium: 4.4 mmol/L (ref 3.5–5.1)
Sodium: 139 mmol/L (ref 135–145)

## 2019-09-02 LAB — CBC
HCT: 43.8 % (ref 36.0–46.0)
Hemoglobin: 14 g/dL (ref 12.0–15.0)
MCH: 25 pg — ABNORMAL LOW (ref 26.0–34.0)
MCHC: 32 g/dL (ref 30.0–36.0)
MCV: 78.2 fL — ABNORMAL LOW (ref 80.0–100.0)
Platelets: 260 10*3/uL (ref 150–400)
RBC: 5.6 MIL/uL — ABNORMAL HIGH (ref 3.87–5.11)
RDW: 18.6 % — ABNORMAL HIGH (ref 11.5–15.5)
WBC: 17.1 10*3/uL — ABNORMAL HIGH (ref 4.0–10.5)
nRBC: 0 % (ref 0.0–0.2)

## 2019-09-02 LAB — I-STAT BETA HCG BLOOD, ED (MC, WL, AP ONLY): I-stat hCG, quantitative: <5 m[IU]/mL (ref <5)

## 2019-09-02 LAB — TROPONIN I (HIGH SENSITIVITY): Troponin I (High Sensitivity): 5 ng/L (ref <18)

## 2019-09-02 MED ORDER — SODIUM CHLORIDE 0.9% FLUSH
3.0000 mL | Freq: Once | INTRAVENOUS | Status: AC
  Administered 2019-09-03: 3 mL via INTRAVENOUS

## 2019-09-02 NOTE — ED Triage Notes (Signed)
Pt c/o chest pain and shortness of breath that has been progressing x 1 week. Denies known covid contacts, no cough/fever.

## 2019-09-03 ENCOUNTER — Emergency Department (HOSPITAL_COMMUNITY): Payer: BC Managed Care – PPO

## 2019-09-03 ENCOUNTER — Encounter (HOSPITAL_COMMUNITY): Payer: Self-pay | Admitting: Radiology

## 2019-09-03 DIAGNOSIS — R0602 Shortness of breath: Secondary | ICD-10-CM | POA: Diagnosis not present

## 2019-09-03 DIAGNOSIS — R0789 Other chest pain: Secondary | ICD-10-CM | POA: Diagnosis not present

## 2019-09-03 LAB — TROPONIN I (HIGH SENSITIVITY): Troponin I (High Sensitivity): 3 ng/L (ref <18)

## 2019-09-03 MED ORDER — KETOROLAC TROMETHAMINE 30 MG/ML IJ SOLN
30.0000 mg | Freq: Once | INTRAMUSCULAR | Status: AC
  Administered 2019-09-03: 30 mg via INTRAVENOUS
  Filled 2019-09-03: qty 1

## 2019-09-03 MED ORDER — IOHEXOL 350 MG/ML SOLN
75.0000 mL | Freq: Once | INTRAVENOUS | Status: AC | PRN
  Administered 2019-09-03: 75 mL via INTRAVENOUS

## 2019-09-03 NOTE — Discharge Instructions (Signed)
All your labs and imaging today were reassuring-- no blood clot or pneumonia. Follow-up with your primary care doctor.  Also recommend to touch base with your rheumatologist about your prednisone. Return here for any new/acute changes-- high fever, rapid breathing, coughing up blood, passing out, etc.

## 2019-09-03 NOTE — ED Notes (Signed)
Discharge instructions reviewed with pt. Pt verbalized understanding.   

## 2019-09-03 NOTE — ED Provider Notes (Addendum)
[facility] EMERGENCY DEPARTMENT Provider Note   CSN: [id number] Arrival date & time: 09/02/19  2206     History Chief Complaint  Patient presents with  . Chest Pain  . Shortness of Breath    Erica Williamson is a 29 y.o. female.  The history is provided by the patient and medical records.  Chest Pain Associated symptoms: shortness of breath   Shortness of Breath Associated symptoms: chest pain     29 y.o. F with hx of plasma cell mastitis, anemia, presenting to the ED for chest pain and shortness of breath for the past week, worse in the past 24 hours.  States she feels pain in bilateral ribs, right worse than left, also worse with deep breathing and movement.  She does report dry cough but no fever/chills.  She has had some palpitations but none currently.   States today she started feeling a lot worse, just very weak all over and had a near syncopal event.  No nausea/vomiting.    Patient currently under care of rheumatology for plasma cell mastitis and is currently on prednisone.  She is also following with a Psychologist, sport and exercise at [facility] about this.  States due to her ongoing prednisone for the past 8 months, she is somewhat immune compromised and has had pneumonia twice in this time.  States this feels slightly different.  She denies any history of DVT or PE but was told she would be prone to this due to her immunocompromise state.  She is not on OCP's.  No recent travel or prolonged immobilization.  History reviewed. No pertinent past medical history.  Patient Active Problem List   Diagnosis Date Noted  . CAP (community acquired pneumonia) 04/18/2019  . HCAP (healthcare-associated pneumonia) 04/17/2019  . Sepsis ([place]) 04/17/2019  . Polyarthralgia 03/06/2019  . Hyperglycemia 02/18/2019  . Plasma cell mastitis of left breast 02/15/2019  . Tachycardia 02/15/2019  . Nausea 02/15/2019  . Transient hypotension 01/26/2019  . Normocytic anemia 01/26/2019    Past  Surgical History:  Procedure Laterality Date  . CESAREAN SECTION     X2     OB History    Gravida  1   Para      Term      Preterm      AB      Living        SAB      TAB      Ectopic      Multiple      Live Births              Family History  Problem Relation Age of Onset  . Diabetes Neg Hx     Social History   Tobacco Use  . Smoking status: Never Smoker  . Smokeless tobacco: Never Used  Substance Use Topics  . Alcohol use: Never  . Drug use: Never    Home Medications Prior to Admission medications   Medication Sig Start Date End Date Taking? Authorizing Provider  Blood Glucose Monitoring Suppl (CONTOUR NEXT MONITOR) w/Device KIT 1 each by Does not apply route daily before breakfast. Patient not taking: Reported on 04/24/2019 03/13/19   Charlott Rakes, MD  cefdinir (OMNICEF) 300 MG capsule Take 1 capsule (300 mg total) by mouth 2 (two) times daily. Patient not taking: Reported on 07/01/2019 04/29/19   Ladell Pier, MD  ergocalciferol (DRISDOL) 1.25 MG (50000 UT) capsule Take 1 capsule (50,000 Units total) by mouth once a week. 07/02/19  Charlott Rakes, MD  glucose blood (CONTOUR NEXT TEST) test strip Use as instructed daily Patient not taking: Reported on 04/24/2019 03/13/19   Charlott Rakes, MD  ondansetron (ZOFRAN ODT) 4 MG disintegrating tablet Take 1 tablet (4 mg total) by mouth every 8 (eight) hours as needed for nausea or vomiting. Patient not taking: Reported on 04/17/2019 02/14/19   [place] Callas, NP  pantoprazole (PROTONIX) 40 MG tablet Take 1 tablet (40 mg total) by mouth daily. Patient taking differently: Take 40 mg by mouth daily as needed (for reflux symptoms).  02/20/19 04/17/19  Radene Gunning, NP  predniSONE (DELTASONE) 20 MG tablet Take 2 tablets (40 mg total) by mouth daily with breakfast. 03/27/19   Charlott Rakes, MD    Allergies    Patient has no known allergies.  Review of Systems   Review of Systems  Respiratory:  Positive for shortness of breath.   Cardiovascular: Positive for chest pain.  All other systems reviewed and are negative.   Physical Exam Updated Vital Signs BP 120/90 (BP Location: Right Arm)   Pulse 89   Temp 98.3 F (36.8 C) (Oral)   Resp 18   Ht _0  (1.626 m)   Wt 74.4 kg   SpO2 100%   BMI 28.15 kg/m   Physical Exam Vitals and nursing note reviewed.  Constitutional:      Appearance: She is well-developed.  HENT:     Head: Normocephalic and atraumatic.  Eyes:     Conjunctiva/sclera: Conjunctivae normal.     Pupils: Pupils are equal, round, and reactive to light.  Cardiovascular:     Rate and Rhythm: Normal rate and regular rhythm.     Heart sounds: Normal heart sounds.  Pulmonary:     Effort: Pulmonary effort is normal.     Breath sounds: Normal breath sounds. No wheezing or rhonchi.     Comments: Lungs grossly clear, no distress, able to speak in full sentences without difficulty Pain elicited with deep breathing, holding right ribs while doing so Chest:     Comments: Some anterior chest wall tenderness, no deformities noted Abdominal:     General: Bowel sounds are normal.     Palpations: Abdomen is soft.  Musculoskeletal:        General: Normal range of motion.     Cervical back: Normal range of motion.  Skin:    General: Skin is warm and dry.  Neurological:     Mental Status: She is alert and oriented to person, place, and time.     ED Results / Procedures / Treatments   Labs (all labs ordered are listed, but only abnormal results are displayed) Labs Reviewed  BASIC METABOLIC PANEL - Abnormal; Notable for the following components:      Result Value   CO2 21 (*)    Glucose, Bld 101 (*)    Creatinine, Ser 0.43 (*)    All other components within normal limits  CBC - Abnormal; Notable for the following components:   WBC 17.1 (*)    RBC 5.60 (*)    MCV 78.2 (*)    MCH 25.0 (*)    RDW 18.6 (*)    All other components within normal limits  I-STAT  BETA HCG BLOOD, ED ([facility], [facility], [facility] ONLY)  TROPONIN I (HIGH SENSITIVITY)  TROPONIN I (HIGH SENSITIVITY)    EKG None  Radiology DG Chest 2 View  Result Date: 09/02/2019 CLINICAL DATA:  Chest pain and shortness of breath, progressive for 1  week. EXAM: CHEST - 2 VIEW COMPARISON:  05/09/2019 FINDINGS: Low lung volumes. Minimal vague opacities at both lung bases. Normal heart size and mediastinal contours. No pleural effusion or pneumothorax. No pulmonary edema. No osseous abnormalities are seen. IMPRESSION: Low lung volumes with minimal vague opacities at both lung bases, atelectasis versus pneumonia, including atypical viral organisms. Electronically Signed   By: Keith Rake M.D.   On: 09/02/2019 22:45   CT Angio Chest PE W and/or Wo Contrast  Result Date: 09/03/2019 CLINICAL DATA:  Shortness of breath immune supressed with AI disease, pleuritic chest pain, worsening SOB EXAM: CT ANGIOGRAPHY CHEST WITH CONTRAST TECHNIQUE: Multidetector CT imaging of the chest was performed using the standard protocol during bolus administration of intravenous contrast. Multiplanar CT image reconstructions and MIPs were obtained to evaluate the vascular anatomy. CONTRAST:  31m OMNIPAQUE IOHEXOL 350 MG/ML SOLN COMPARISON:  Chest radiograph yesterday. Chest CTA 01/28/2019 FINDINGS: Cardiovascular: There are no filling defects within the pulmonary arteries to suggest pulmonary embolus. No aortic dissection or aneurysm. Heart is normal in size. No pericardial effusion. Mediastinum/Nodes: No enlarged mediastinal or hilar lymph nodes. No esophageal wall thickening. No visualized thyroid nodule. Lungs/Pleura: No focal airspace disease. Previous pleural effusions have resolved. No pulmonary edema. Pulmonary nodule or mass. Upper Abdomen: No acute abnormality. Musculoskeletal: There are no acute or suspicious osseous abnormalities. Scattered soft tissue nodular densities in the left breast, with decreased breast edema from prior.  Review of the MIP images confirms the above findings. IMPRESSION: 1. No pulmonary embolus or acute intrathoracic abnormality. 2. Soft tissue nodules in the left breast. Patient has undergone previous breast biopsy and evaluation at the breast center. This could be further evaluated with dedicated breast imaging. Electronically Signed   By: MKeith RakeM.D.   On: 09/03/2019 06:09    Procedures Procedures (including critical care time)  Medications Ordered in ED Medications  sodium chloride flush (NS) 0.9 % injection 3 mL (3 mLs Intravenous Given 09/03/19 0533)  ketorolac (TORADOL) 30 MG/ML injection 30 mg (30 mg Intravenous Given 09/03/19 0612)  iohexol (OMNIPAQUE) 350 MG/ML injection 75 mL (75 mLs Intravenous Contrast Given 09/03/19 0601)    ED Course  I have reviewed the triage vital signs and the nursing notes.  Pertinent labs & imaging results that were available during my care of the patient were reviewed by me and considered in my medical decision making (see chart for details).    MDM Rules/Calculators/A&P  29year old female here with chest pain and shortness of breath for over a week, worse over the past 24 hours.  Has had a dry cough, pleuritic pain with deep breathing, palpitations, and near syncopal event yesterday.  Has history of plasma cell mastitis and is following with rheumatology for this.  Is on chronic prednisone currently.  She is afebrile and nontoxic in appearance here.  She does have some tenderness of the chest wall and reproducible pain when deep breathing.  Her EKG here is nonischemic.  Labs are reassuring including troponin x2.  Does have leukocytosis, however this appears chronic and is likely from her chronic steroid use.  Chest x-ray with questionable atelectasis versus atypical pneumonia.  Patient does have autoimmune disease and is felt to be high risk for PE.  Given her near syncopal event today along with pleuritic symptoms, will obtain CTA of the chest for  further evaluation.  CTA negative for PE or other acute findings.  She does have some chronic findings of the left breast which are known.  Patient has remained hemodynamically stable here in the ER without tachycardia or hypoxia.  At this point, feel she is stable for discharge home.  We have discussed option of repeat covid testing, however she declines as she has had 5 tests already over the past few months which were all negative.  We will have her follow-up closely with her rheumatologist and her primary care doctor.  She will return here for any new or acute changes.  Final Clinical Impression(s) / ED Diagnoses Final diagnoses:  Chest pain in adult  Shortness of breath    Rx / DC Orders ED Discharge Orders    None       Larene Pickett, PA-C 09/03/19 Jerome, Delice Bison, DO 09/03/19 0724    Larene Pickett, PA-C 09/03/19 2208    Ward, Delice Bison, DO 09/03/19 2304

## 2019-09-12 DIAGNOSIS — Z7952 Long term (current) use of systemic steroids: Secondary | ICD-10-CM | POA: Diagnosis not present

## 2019-09-12 DIAGNOSIS — Z6828 Body mass index (BMI) 28.0-28.9, adult: Secondary | ICD-10-CM | POA: Diagnosis not present

## 2019-09-12 DIAGNOSIS — G8929 Other chronic pain: Secondary | ICD-10-CM | POA: Diagnosis not present

## 2019-09-12 DIAGNOSIS — N6049 Mammary duct ectasia of unspecified breast: Secondary | ICD-10-CM | POA: Diagnosis not present

## 2019-09-12 DIAGNOSIS — N6042 Mammary duct ectasia of left breast: Secondary | ICD-10-CM | POA: Diagnosis not present

## 2019-09-30 ENCOUNTER — Other Ambulatory Visit: Payer: Self-pay

## 2019-09-30 ENCOUNTER — Ambulatory Visit: Payer: BC Managed Care – PPO | Attending: Family Medicine | Admitting: Family Medicine

## 2019-09-30 DIAGNOSIS — N6042 Mammary duct ectasia of left breast: Secondary | ICD-10-CM | POA: Diagnosis not present

## 2019-09-30 DIAGNOSIS — Z79899 Other long term (current) drug therapy: Secondary | ICD-10-CM | POA: Diagnosis not present

## 2019-09-30 DIAGNOSIS — E559 Vitamin D deficiency, unspecified: Secondary | ICD-10-CM

## 2019-09-30 NOTE — Progress Notes (Signed)
Patient has been called and DOB has been verified. Patient has been screened and transferred to PCP to start phone visit.     

## 2019-09-30 NOTE — Progress Notes (Signed)
Virtual Visit via Telephone Note  I connected with Erica Williamson, on 09/30/2019 at 1:34 PM by telephone due to the COVID-19 pandemic and verified that I am speaking with the correct person using two identifiers.   Consent: I discussed the limitations, risks, security and privacy concerns of performing an evaluation and management service by telephone and the availability of in person appointments. I also discussed with the patient that there may be a patient responsible charge related to this service. The patient expressed understanding and agreed to proceed.   Location of Patient: Home  Location of Provider: Clinic   Persons participating in Telemedicine visit: [place] [facility] [place]-CMA Dr. Margarita Rana     History of Present Illness: Erica Williamson is a 29 year old femalewith left breast plasma cell mastitis, generalized arthralgias on chronic prednisone here for a follow up visit   Left breast plasma cell mastitis is followed by [facility].  On attempting to taper her off Prednisone she had a flare up. She is now on Prednisone 37m in addition to Methotrexate, the latter of which is being prescribed by rheumatology.  [facility] work-up so far reveals no underlying autoimmune condition. She has some arthralgias, fevers, headaches, chest pains which she describes as mild and resolved with ibuprofen.  Blood sugars are under 140 and she has been cognizant of her eating habits and her lifestyle. She has gained 35 lbs and when she tries to exercise her joints hurts. She does have a concern of elevated red blood cell count which was noticed by her rheumatologist (5.09<5.53,5.60) but her hemoglobin has remained normal and she does have a slight white blood cell count elevated at 17.1  No past medical history on file. No Known Allergies  Current Outpatient Medications on File Prior to Visit  Medication  Sig Dispense Refill  . folic acid (FOLVITE) 1 MG tablet Take by mouth.    . methotrexate (RHEUMATREX) 2.5 MG tablet Take by mouth.    . predniSONE (DELTASONE) 20 MG tablet Take 2 tablets (40 mg total) by mouth daily with breakfast. 60 tablet 1  . Blood Glucose Monitoring Suppl (CONTOUR NEXT MONITOR) w/Device KIT 1 each by Does not apply route daily before breakfast. (Patient not taking: Reported on 04/24/2019) 1 kit 0  . cefdinir (OMNICEF) 300 MG capsule Take 1 capsule (300 mg total) by mouth 2 (two) times daily. (Patient not taking: Reported on 07/01/2019) 10 capsule 0  . ergocalciferol (DRISDOL) 1.25 MG (50000 UT) capsule Take 1 capsule (50,000 Units total) by mouth once a week. (Patient not taking: Reported on 09/30/2019) 9 capsule 1  . glucose blood (CONTOUR NEXT TEST) test strip Use as instructed daily (Patient not taking: Reported on 04/24/2019) 100 each 12  . ondansetron (ZOFRAN ODT) 4 MG disintegrating tablet Take 1 tablet (4 mg total) by mouth every 8 (eight) hours as needed for nausea or vomiting. (Patient not taking: Reported on 04/17/2019) 30 tablet 0  . pantoprazole (PROTONIX) 40 MG tablet Take 1 tablet (40 mg total) by mouth daily. (Patient taking differently: Take 40 mg by mouth daily as needed (for reflux symptoms). ) 30 tablet 2   No current facility-administered medications on file prior to visit.    Observations/Objective: Awake, alert, oriented x3 Not in acute distress   Lab Results  Component Value Date   HGBA1C 5.9 07/01/2019    Assessment and Plan: 1. Plasma cell mastitis of left breast Currently on prednisone 20 mg  and methotrexate We will order CBC in 1 month to follow-up on red blood cell count which has been trending up Leukocytosis is in keeping with chronic prednisone use Follow-up with Surgical Oncology at [facility] rheumatology - CBC with Differential/Platelet; Future  2. High risk medication use Chronic steroid use puts her at risk of developing diabetes  mellitus Continue to check blood sugars, adhere to diabetic diet and lifestyle modifications - Hemoglobin A1c; Future  3. Vitamin D deficiency Previous history of vitamin D deficiency, completed course of Drisdol We will check level again - VITAMIN D 25 Hydroxy (Vit-D Deficiency, Fractures); Future   Follow Up Instructions: Return in about 1 month (around 10/31/2019) for Labs, 3 months-in person visit.    I discussed the assessment and treatment plan with the patient. The patient was provided an opportunity to ask questions and all were answered. The patient agreed with the plan and demonstrated an understanding of the instructions.   The patient was advised to call back or seek an in-person evaluation if the symptoms worsen or if the condition fails to improve as anticipated.     I provided 15 minutes total of non-face-to-face time during this encounter including median intraservice time, reviewing previous notes, investigations, ordering medications, medical decision making, coordinating care and patient verbalized understanding at the end of the visit.     Charlott Rakes, MD, FAAFP. [facility] [place], [place]   09/30/2019, 1:34 PM

## 2019-10-01 ENCOUNTER — Encounter: Payer: Self-pay | Admitting: Family Medicine

## 2019-10-03 DIAGNOSIS — Z6829 Body mass index (BMI) 29.0-29.9, adult: Secondary | ICD-10-CM | POA: Diagnosis not present

## 2019-10-03 DIAGNOSIS — N6042 Mammary duct ectasia of left breast: Secondary | ICD-10-CM | POA: Diagnosis not present

## 2019-10-03 DIAGNOSIS — Z79899 Other long term (current) drug therapy: Secondary | ICD-10-CM | POA: Diagnosis not present

## 2019-10-31 ENCOUNTER — Other Ambulatory Visit: Payer: BC Managed Care – PPO

## 2019-10-31 DIAGNOSIS — N6042 Mammary duct ectasia of left breast: Secondary | ICD-10-CM | POA: Diagnosis not present

## 2019-10-31 DIAGNOSIS — N6122 Granulomatous mastitis, left breast: Secondary | ICD-10-CM | POA: Diagnosis not present

## 2019-10-31 DIAGNOSIS — Z6829 Body mass index (BMI) 29.0-29.9, adult: Secondary | ICD-10-CM | POA: Diagnosis not present

## 2019-10-31 DIAGNOSIS — Z9289 Personal history of other medical treatment: Secondary | ICD-10-CM | POA: Diagnosis not present

## 2019-11-11 ENCOUNTER — Other Ambulatory Visit: Payer: Self-pay | Admitting: Family Medicine

## 2019-12-05 DIAGNOSIS — Z6828 Body mass index (BMI) 28.0-28.9, adult: Secondary | ICD-10-CM | POA: Diagnosis not present

## 2019-12-05 DIAGNOSIS — N6042 Mammary duct ectasia of left breast: Secondary | ICD-10-CM | POA: Diagnosis not present

## 2019-12-10 DIAGNOSIS — Z79899 Other long term (current) drug therapy: Secondary | ICD-10-CM | POA: Diagnosis not present

## 2019-12-10 DIAGNOSIS — N6120 Granulomatous mastitis, unspecified breast: Secondary | ICD-10-CM | POA: Diagnosis not present

## 2019-12-10 DIAGNOSIS — N6049 Mammary duct ectasia of unspecified breast: Secondary | ICD-10-CM | POA: Diagnosis not present

## 2019-12-10 DIAGNOSIS — G8929 Other chronic pain: Secondary | ICD-10-CM | POA: Diagnosis not present

## 2019-12-10 DIAGNOSIS — Z7952 Long term (current) use of systemic steroids: Secondary | ICD-10-CM | POA: Diagnosis not present

## 2020-01-08 DIAGNOSIS — R928 Other abnormal and inconclusive findings on diagnostic imaging of breast: Secondary | ICD-10-CM | POA: Diagnosis not present

## 2020-01-08 DIAGNOSIS — N6042 Mammary duct ectasia of left breast: Secondary | ICD-10-CM | POA: Diagnosis not present

## 2020-01-08 DIAGNOSIS — Z6827 Body mass index (BMI) 27.0-27.9, adult: Secondary | ICD-10-CM | POA: Diagnosis not present

## 2020-02-10 DIAGNOSIS — Z79899 Other long term (current) drug therapy: Secondary | ICD-10-CM | POA: Diagnosis not present

## 2020-02-10 DIAGNOSIS — Z7952 Long term (current) use of systemic steroids: Secondary | ICD-10-CM | POA: Diagnosis not present

## 2020-02-10 DIAGNOSIS — G8929 Other chronic pain: Secondary | ICD-10-CM | POA: Diagnosis not present

## 2020-02-10 DIAGNOSIS — N6120 Granulomatous mastitis, unspecified breast: Secondary | ICD-10-CM | POA: Diagnosis not present

## 2020-02-10 DIAGNOSIS — N6049 Mammary duct ectasia of unspecified breast: Secondary | ICD-10-CM | POA: Diagnosis not present

## 2020-03-13 DIAGNOSIS — Z803 Family history of malignant neoplasm of breast: Secondary | ICD-10-CM | POA: Diagnosis not present

## 2020-03-13 DIAGNOSIS — Z79899 Other long term (current) drug therapy: Secondary | ICD-10-CM | POA: Diagnosis not present

## 2020-03-13 DIAGNOSIS — Z6826 Body mass index (BMI) 26.0-26.9, adult: Secondary | ICD-10-CM | POA: Diagnosis not present

## 2020-03-13 DIAGNOSIS — N6042 Mammary duct ectasia of left breast: Secondary | ICD-10-CM | POA: Diagnosis not present

## 2020-06-04 DIAGNOSIS — G8929 Other chronic pain: Secondary | ICD-10-CM | POA: Diagnosis not present

## 2020-06-04 DIAGNOSIS — Z79899 Other long term (current) drug therapy: Secondary | ICD-10-CM | POA: Diagnosis not present

## 2020-06-04 DIAGNOSIS — N6120 Granulomatous mastitis, unspecified breast: Secondary | ICD-10-CM | POA: Diagnosis not present

## 2020-06-16 IMAGING — NM NM PULMONARY PERFUSION PARTICULATE
8 series · 8 of 8 positions shown · non-contrast
Comparison: Chest radiograph January 26, 2019

CLINICAL DATA: Respiratory distress and elevated D-dimer

EXAM:
NUCLEAR MEDICINE PERFUSION LUNG SCAN
VIEWS:
Anterior, posterior, left lateral, right lateral, RPO, LPO, RAO, LAO
RADIOPHARMACEUTICALS:  1.5 mCi Lc-AAm MAA IV

[Series 1: ant/post perf · 4.14mm/px · 1 of 1 slices shown (1 of 2)]
[im 1/1]
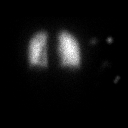

[Series 1: ant/post perf · 4.14mm/px · 1 of 1 slices shown (2 of 2)]
[im 1/1]
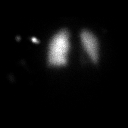

[Series 2: lao/rpo perf · 4.14mm/px · 1 of 1 slices shown (1 of 2)]
[im 1/1]
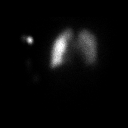

[Series 2: lao/rpo perf · 4.14mm/px · 1 of 1 slices shown (2 of 2)]
[im 1/1]
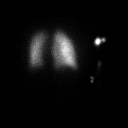

[Series 3: lpo/rao perf · 4.14mm/px · 1 of 1 slices shown (1 of 2)]
[im 1/1]
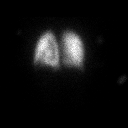

[Series 3: lpo/rao perf · 4.14mm/px · 1 of 1 slices shown (2 of 2)]
[im 1/1]
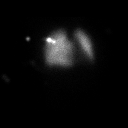

[Series 4: lt lat/rt lat perf · 4.14mm/px · 1 of 1 slices shown (1 of 2)]
[im 1/1]
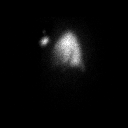

[Series 4: lt lat/rt lat perf · 4.14mm/px · 1 of 1 slices shown (2 of 2)]
[im 1/1]
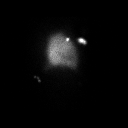

[8 of 8 positions shown; findings below may reference images not displayed]

FINDINGS: Radiotracer uptake is homogeneous and symmetric bilaterally. No
perfusion defects are demonstrable.
IMPRESSION: No appreciable perfusion defects. Very low probability of pulmonary
embolus.

## 2020-06-16 IMAGING — CR CHEST - 2 VIEW
2 series · 2 of 2 positions shown · non-contrast
Comparison: 01/25/2019

CLINICAL DATA: Community acquired pneumonia

EXAM:
CHEST - 2 VIEW

[chest lat]
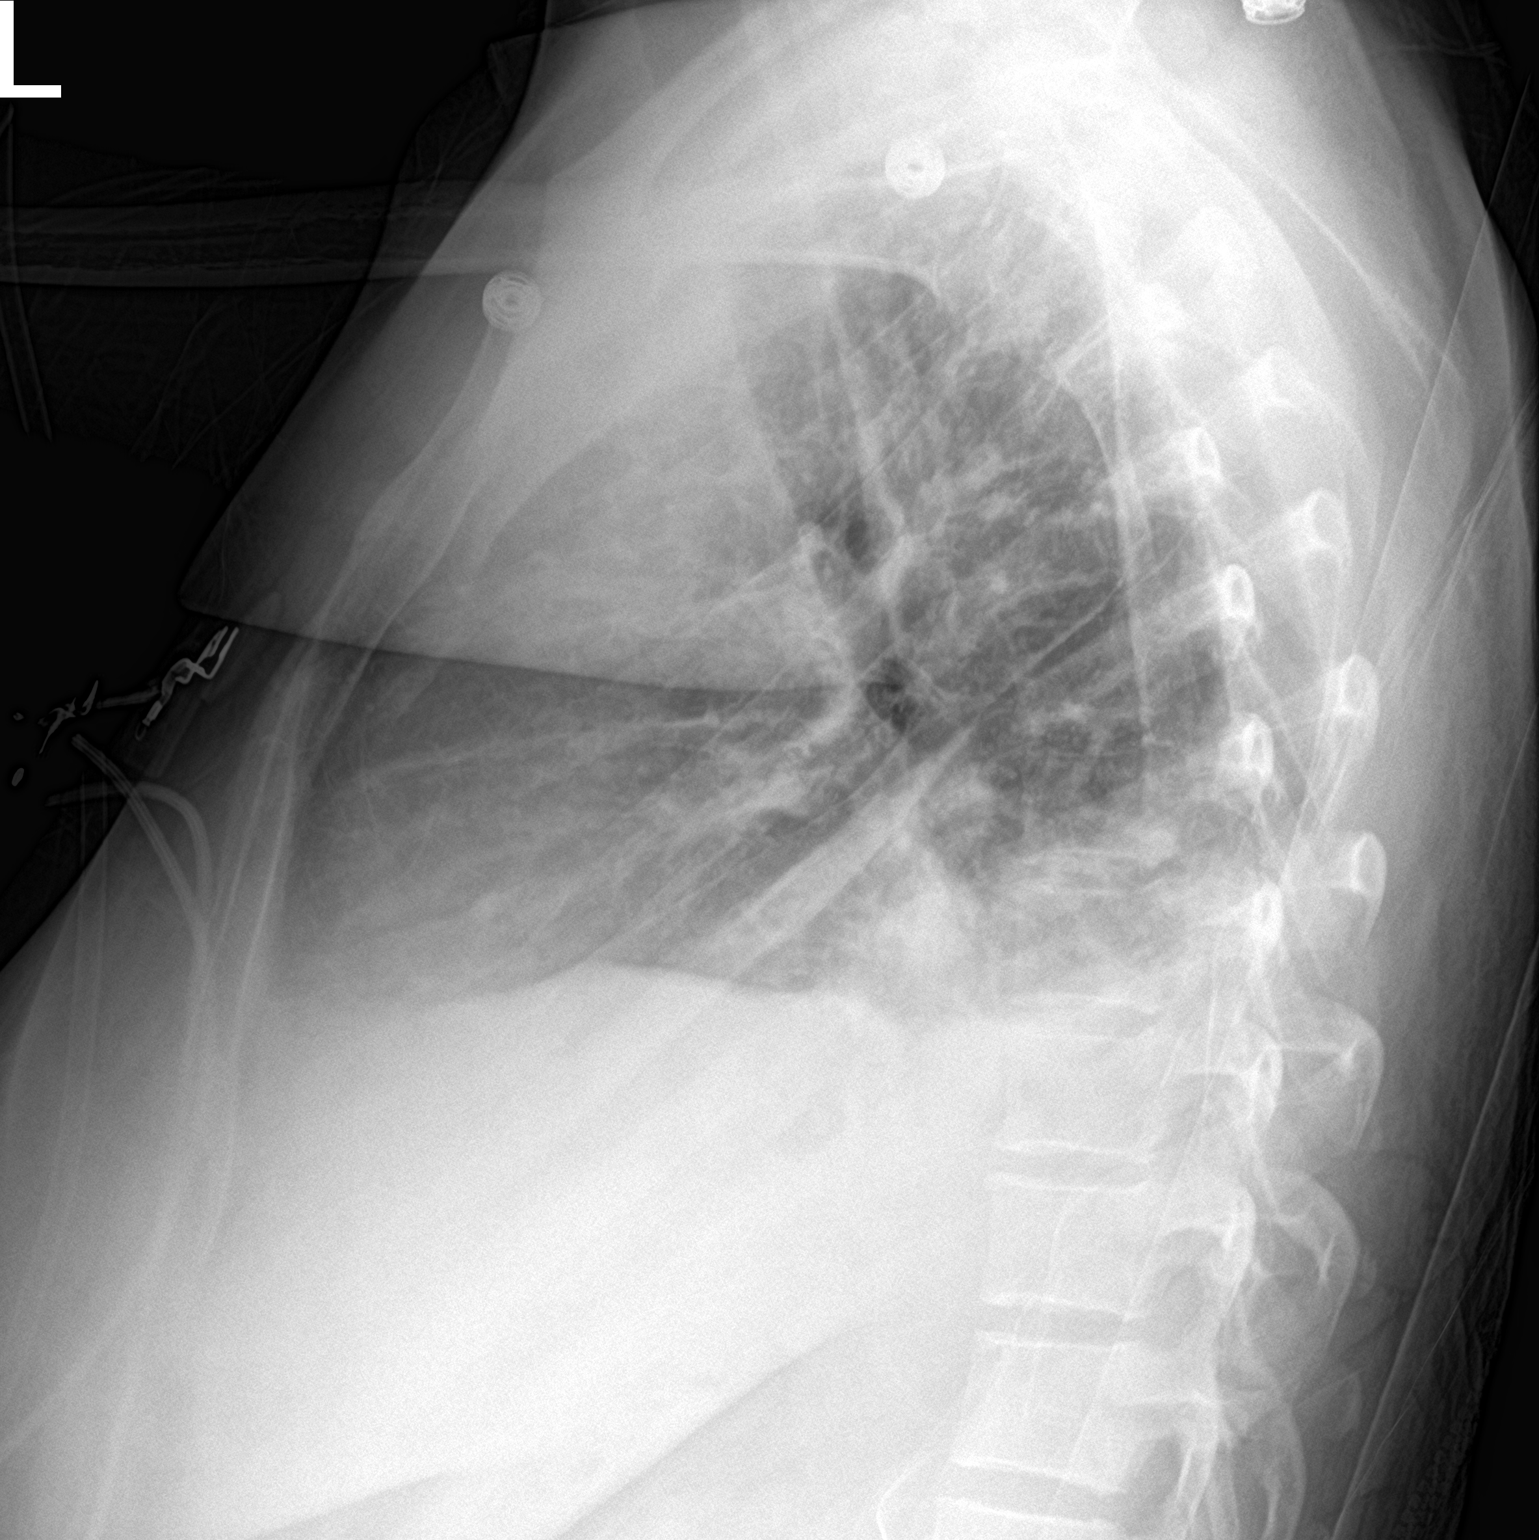

[chest ap]
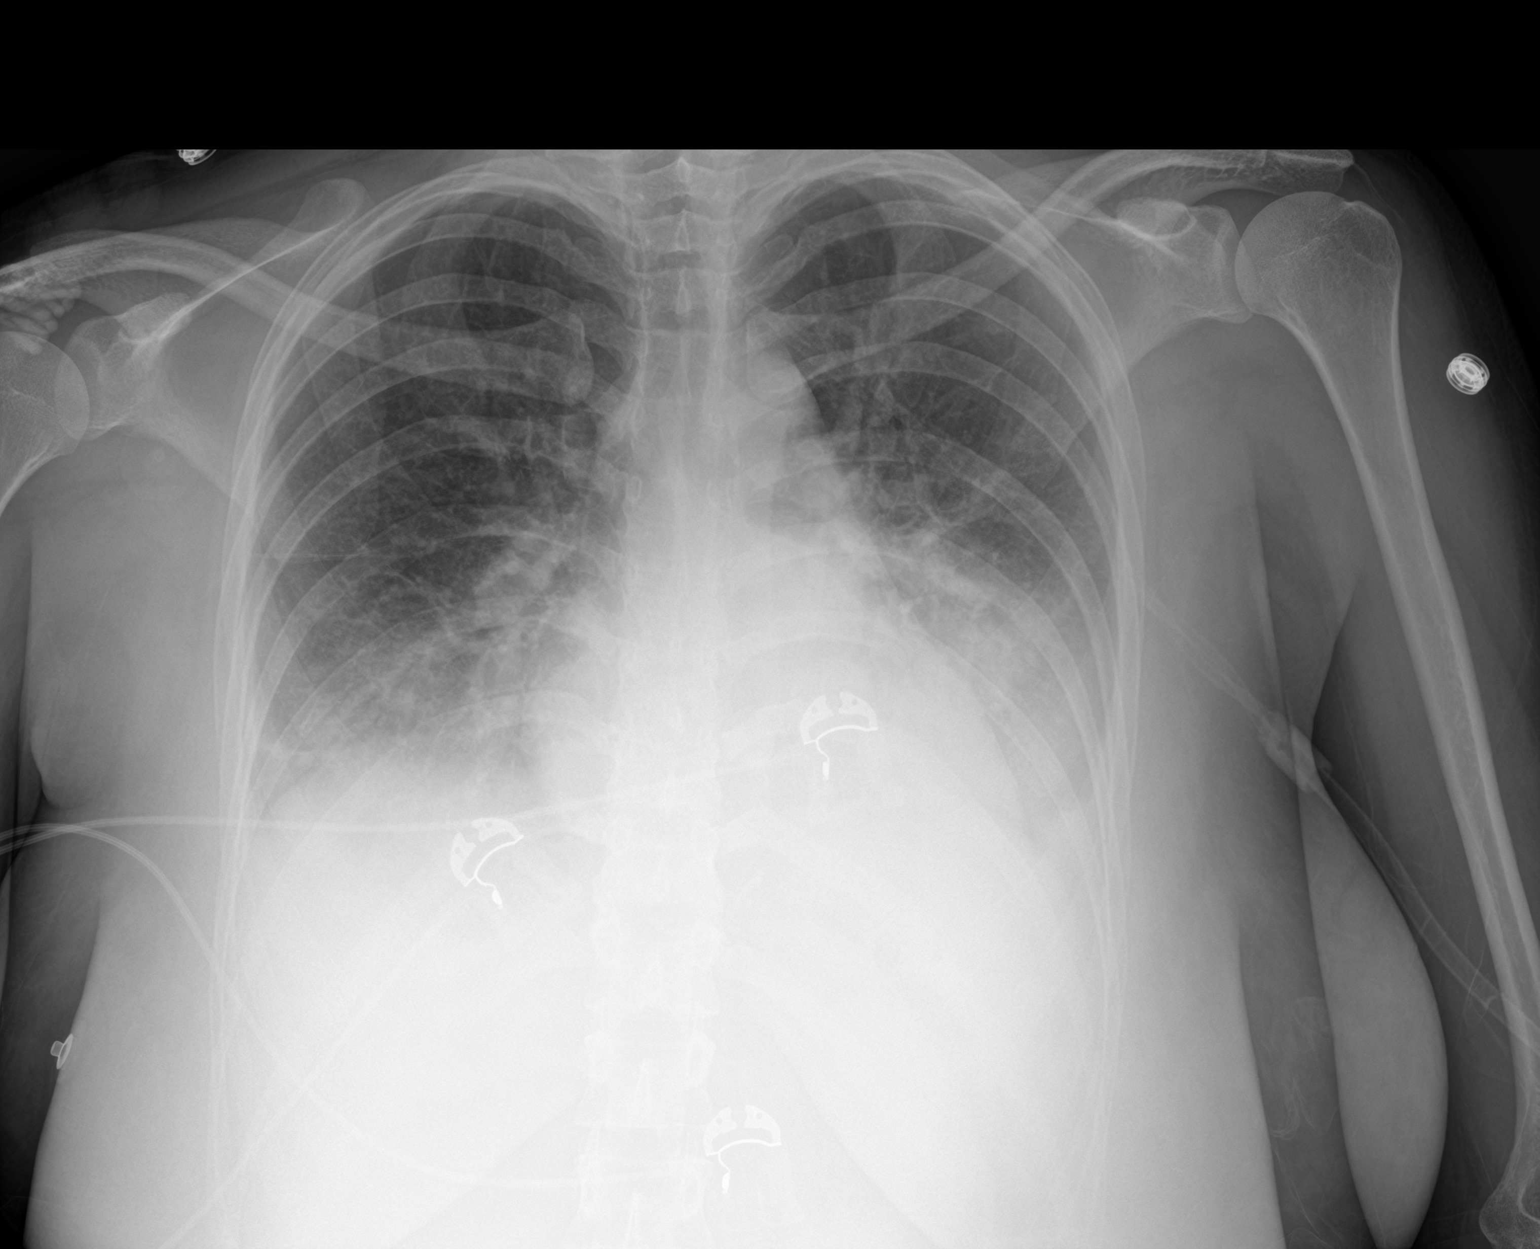

[2 of 2 positions shown; findings below may reference images not displayed]

FINDINGS: Slight interval increase in small bilateral pleural effusions and
associated bibasilar atelectasis or consolidation. No new airspace
opacity.
IMPRESSION: Slight interval increase in small bilateral pleural effusions and
associated bibasilar atelectasis or consolidation. No new airspace
opacity.

## 2020-06-19 DIAGNOSIS — N6042 Mammary duct ectasia of left breast: Secondary | ICD-10-CM | POA: Diagnosis not present

## 2020-06-19 DIAGNOSIS — N6122 Granulomatous mastitis, left breast: Secondary | ICD-10-CM | POA: Diagnosis not present

## 2020-06-19 DIAGNOSIS — Z6825 Body mass index (BMI) 25.0-25.9, adult: Secondary | ICD-10-CM | POA: Diagnosis not present

## 2020-09-07 IMAGING — DX DG CHEST 2V
2 series · 2 of 2 positions shown · non-contrast
Comparison: 04/17/2019 chest radiograph.

CLINICAL DATA: Healthcare associated pneumonia

EXAM:
CHEST - 2 VIEW

[chest pa]
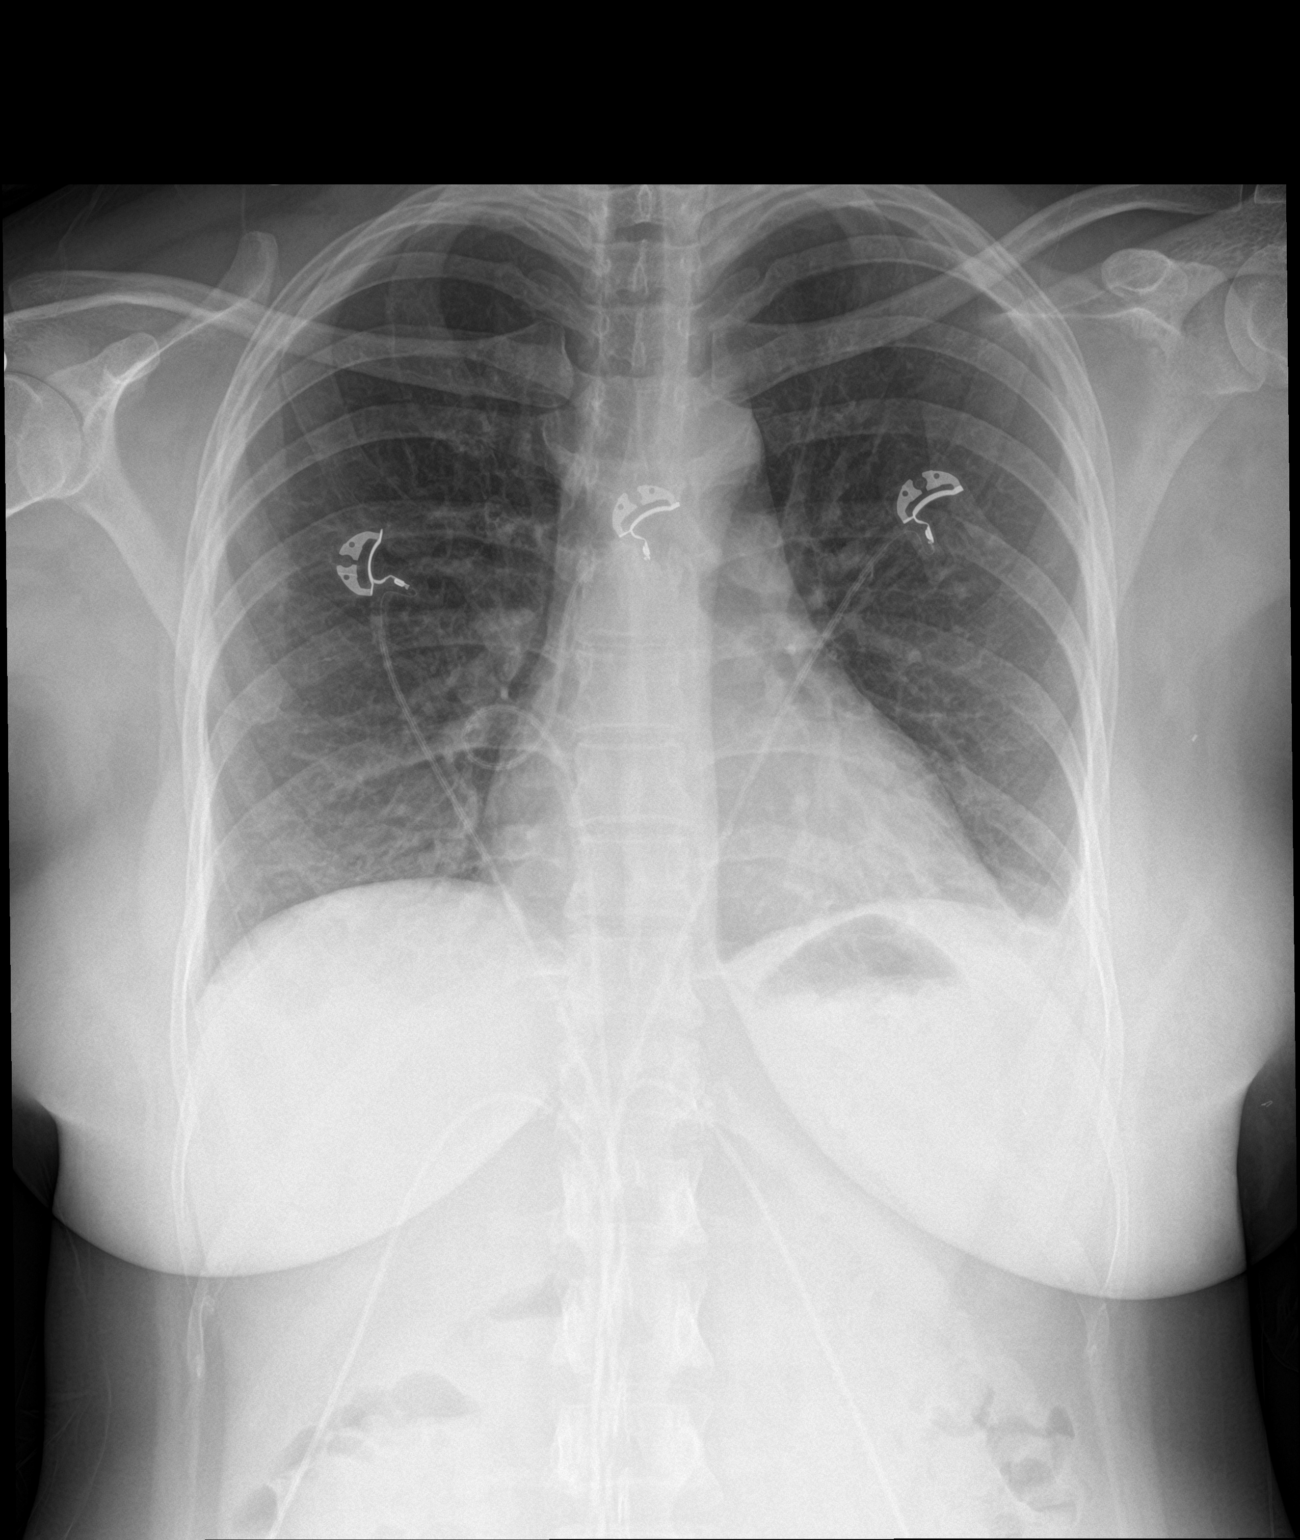

[chest lat]
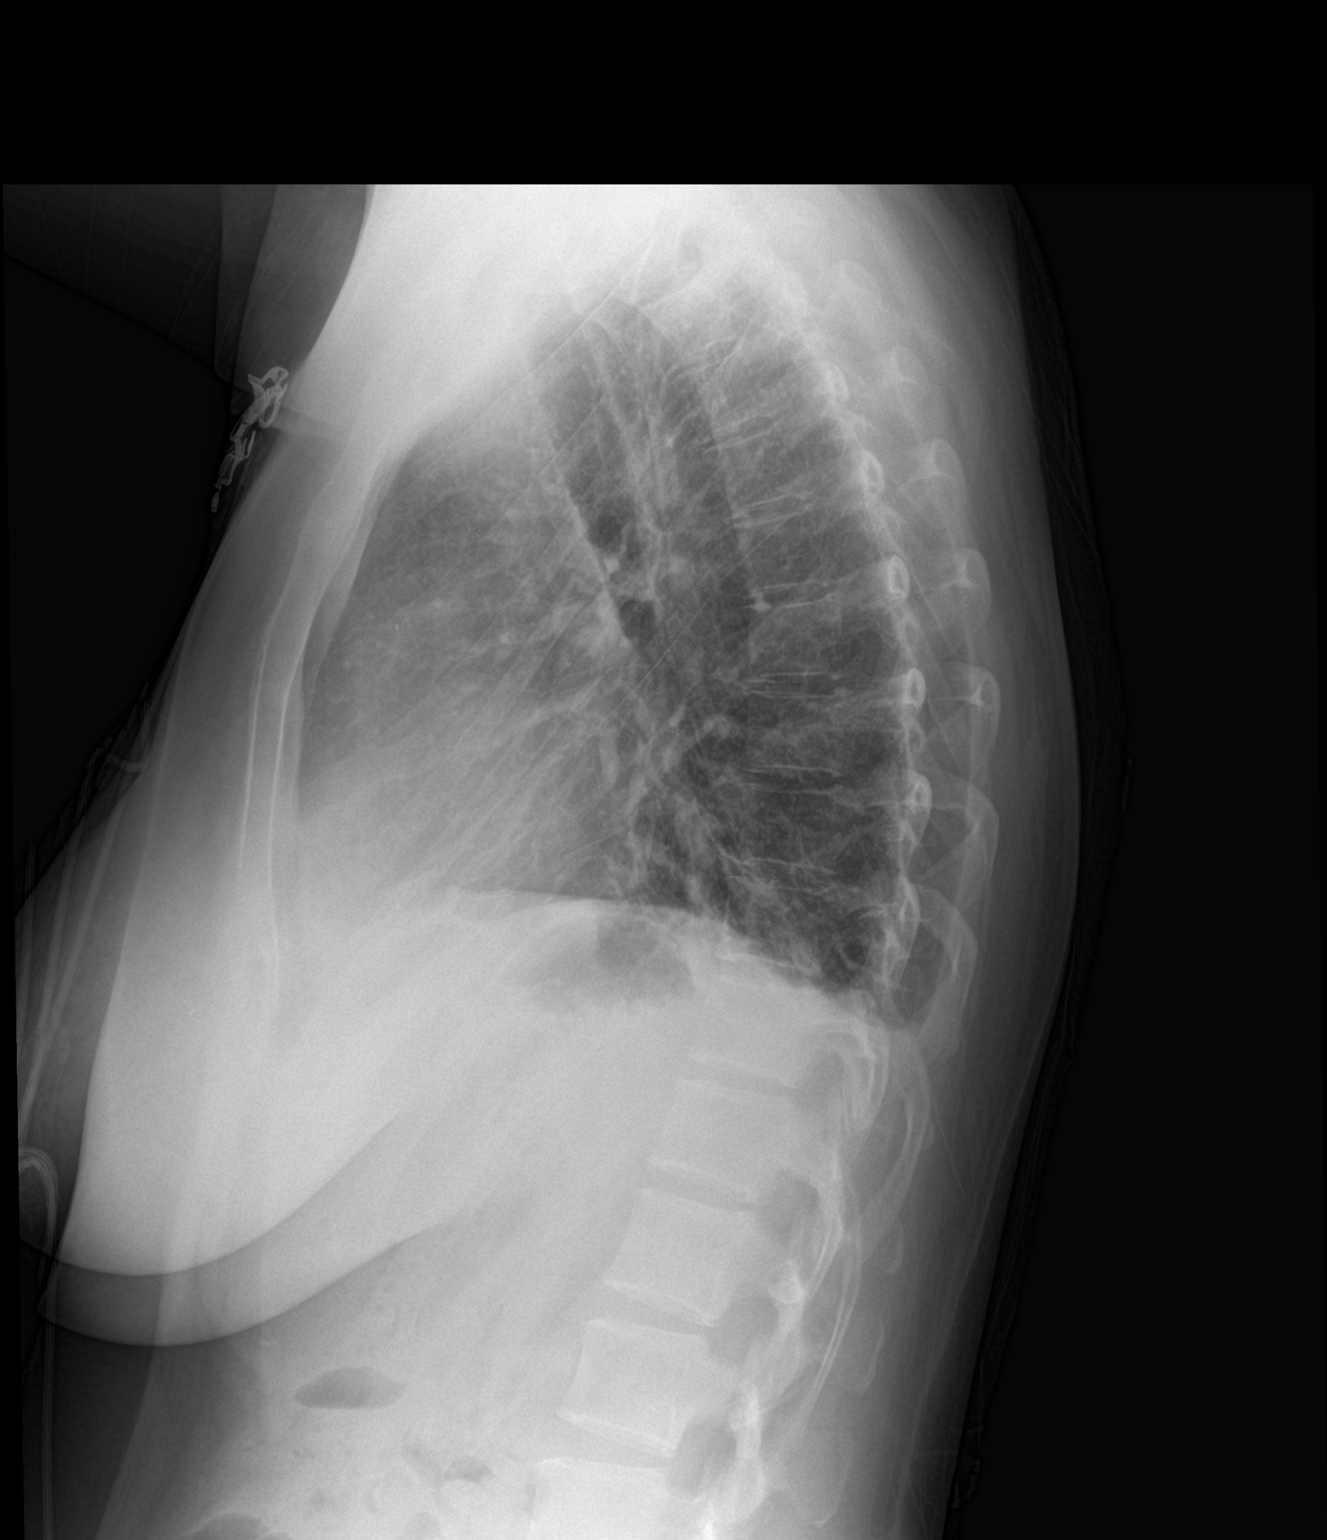

[2 of 2 positions shown; findings below may reference images not displayed]

FINDINGS: Stable cardiomediastinal silhouette with normal heart size. No
pneumothorax. Slight blunting of the costophrenic angles
bilaterally, cannot exclude trace pleural effusions, slightly
increased on the left. No pulmonary edema. Hazy opacities at both
lung bases are improved.
IMPRESSION: Improved hazy opacities at both lung bases. Slight blunting of the
costophrenic angles bilaterally, cannot exclude trace pleural
effusions, slightly increased on the left.
